# Patient Record
Sex: Female | Born: 1980 | Race: White | Hispanic: No | Marital: Married | Smoking: Former smoker
Health system: Southern US, Community
[De-identification: ages and names within clinical notes are randomized; demographics above are authoritative.]

## PROBLEM LIST (undated history)

## (undated) DIAGNOSIS — M249 Joint derangement, unspecified: Secondary | ICD-10-CM

## (undated) DIAGNOSIS — K589 Irritable bowel syndrome without diarrhea: Secondary | ICD-10-CM

## (undated) DIAGNOSIS — G43909 Migraine, unspecified, not intractable, without status migrainosus: Secondary | ICD-10-CM

## (undated) DIAGNOSIS — I499 Cardiac arrhythmia, unspecified: Secondary | ICD-10-CM

## (undated) DIAGNOSIS — E282 Polycystic ovarian syndrome: Secondary | ICD-10-CM

## (undated) DIAGNOSIS — I10 Essential (primary) hypertension: Secondary | ICD-10-CM

## (undated) DIAGNOSIS — K8020 Calculus of gallbladder without cholecystitis without obstruction: Secondary | ICD-10-CM

## (undated) DIAGNOSIS — H7090 Unspecified mastoiditis, unspecified ear: Secondary | ICD-10-CM

## (undated) DIAGNOSIS — N83209 Unspecified ovarian cyst, unspecified side: Secondary | ICD-10-CM

## (undated) DIAGNOSIS — R519 Headache, unspecified: Secondary | ICD-10-CM

## (undated) DIAGNOSIS — B999 Unspecified infectious disease: Secondary | ICD-10-CM

## (undated) DIAGNOSIS — G5760 Lesion of plantar nerve, unspecified lower limb: Secondary | ICD-10-CM

## (undated) DIAGNOSIS — R630 Anorexia: Secondary | ICD-10-CM

## (undated) DIAGNOSIS — R51 Headache: Secondary | ICD-10-CM

## (undated) DIAGNOSIS — K859 Acute pancreatitis without necrosis or infection, unspecified: Secondary | ICD-10-CM

## (undated) DIAGNOSIS — F419 Anxiety disorder, unspecified: Secondary | ICD-10-CM

## (undated) DIAGNOSIS — R17 Unspecified jaundice: Secondary | ICD-10-CM

## (undated) DIAGNOSIS — M419 Scoliosis, unspecified: Secondary | ICD-10-CM

## (undated) DIAGNOSIS — L409 Psoriasis, unspecified: Secondary | ICD-10-CM

## (undated) HISTORY — DX: Essential (primary) hypertension: I10

## (undated) HISTORY — DX: Polycystic ovarian syndrome: E28.2

---
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
## 1999-08-08 ENCOUNTER — Other Ambulatory Visit: Admission: RE | Admit: 1999-08-08 | Discharge: 1999-08-08 | Payer: Self-pay | Admitting: Obstetrics & Gynecology

## 2000-04-22 ENCOUNTER — Ambulatory Visit (HOSPITAL_COMMUNITY): Admission: RE | Admit: 2000-04-22 | Discharge: 2000-04-22 | Payer: Self-pay | Admitting: Emergency Medicine

## 2000-04-22 ENCOUNTER — Encounter: Payer: Self-pay | Admitting: Emergency Medicine

## 2000-06-01 ENCOUNTER — Ambulatory Visit (HOSPITAL_COMMUNITY): Admission: AD | Admit: 2000-06-01 | Discharge: 2000-06-01 | Payer: Self-pay | Admitting: Obstetrics & Gynecology

## 2000-06-01 ENCOUNTER — Encounter (INDEPENDENT_AMBULATORY_CARE_PROVIDER_SITE_OTHER): Payer: Self-pay

## 2000-12-10 HISTORY — PX: DILATION AND CURETTAGE OF UTERUS: SHX78

## 2001-02-11 ENCOUNTER — Emergency Department (HOSPITAL_COMMUNITY): Admission: EM | Admit: 2001-02-11 | Discharge: 2001-02-11 | Payer: Self-pay | Admitting: Emergency Medicine

## 2001-03-19 ENCOUNTER — Other Ambulatory Visit: Admission: RE | Admit: 2001-03-19 | Discharge: 2001-03-19 | Payer: Self-pay | Admitting: Obstetrics & Gynecology

## 2002-01-22 ENCOUNTER — Ambulatory Visit (HOSPITAL_COMMUNITY): Admission: RE | Admit: 2002-01-22 | Discharge: 2002-01-22 | Payer: Self-pay | Admitting: Gastroenterology

## 2002-01-22 ENCOUNTER — Encounter: Payer: Self-pay | Admitting: Gastroenterology

## 2002-08-26 ENCOUNTER — Emergency Department (HOSPITAL_COMMUNITY): Admission: EM | Admit: 2002-08-26 | Discharge: 2002-08-27 | Payer: Self-pay | Admitting: Emergency Medicine

## 2002-08-27 ENCOUNTER — Encounter: Payer: Self-pay | Admitting: Emergency Medicine

## 2003-10-09 ENCOUNTER — Emergency Department (HOSPITAL_COMMUNITY): Admission: EM | Admit: 2003-10-09 | Discharge: 2003-10-09 | Payer: Self-pay | Admitting: Emergency Medicine

## 2004-02-21 ENCOUNTER — Ambulatory Visit (HOSPITAL_COMMUNITY): Admission: RE | Admit: 2004-02-21 | Discharge: 2004-02-21 | Payer: Self-pay | Admitting: Family Medicine

## 2004-05-23 ENCOUNTER — Other Ambulatory Visit: Admission: RE | Admit: 2004-05-23 | Discharge: 2004-05-23 | Payer: Self-pay | Admitting: Family Medicine

## 2004-07-25 ENCOUNTER — Encounter: Payer: Self-pay | Admitting: Emergency Medicine

## 2004-07-25 ENCOUNTER — Inpatient Hospital Stay (HOSPITAL_COMMUNITY): Admission: AD | Admit: 2004-07-25 | Discharge: 2004-07-25 | Payer: Self-pay | Admitting: Obstetrics and Gynecology

## 2004-07-26 ENCOUNTER — Inpatient Hospital Stay (HOSPITAL_COMMUNITY): Admission: AD | Admit: 2004-07-26 | Discharge: 2004-07-26 | Payer: Self-pay | Admitting: Obstetrics and Gynecology

## 2004-12-10 HISTORY — PX: LAPAROSCOPIC CHOLECYSTECTOMY: SUR755

## 2006-09-05 ENCOUNTER — Encounter: Admission: RE | Admit: 2006-09-05 | Discharge: 2006-09-05 | Payer: Self-pay | Admitting: Orthopaedic Surgery

## 2006-11-11 ENCOUNTER — Other Ambulatory Visit: Admission: RE | Admit: 2006-11-11 | Discharge: 2006-11-11 | Payer: Self-pay | Admitting: Obstetrics and Gynecology

## 2007-10-05 ENCOUNTER — Emergency Department (HOSPITAL_COMMUNITY): Admission: EM | Admit: 2007-10-05 | Discharge: 2007-10-05 | Payer: Self-pay | Admitting: Emergency Medicine

## 2007-10-12 ENCOUNTER — Encounter (INDEPENDENT_AMBULATORY_CARE_PROVIDER_SITE_OTHER): Payer: Self-pay | Admitting: General Surgery

## 2007-10-12 ENCOUNTER — Inpatient Hospital Stay (HOSPITAL_COMMUNITY): Admission: EM | Admit: 2007-10-12 | Discharge: 2007-10-15 | Payer: Self-pay | Admitting: Emergency Medicine

## 2007-12-02 ENCOUNTER — Encounter: Admission: RE | Admit: 2007-12-02 | Discharge: 2007-12-02 | Payer: Self-pay | Admitting: General Surgery

## 2007-12-12 ENCOUNTER — Inpatient Hospital Stay (HOSPITAL_COMMUNITY): Admission: EM | Admit: 2007-12-12 | Discharge: 2007-12-15 | Payer: Self-pay | Admitting: Emergency Medicine

## 2008-03-09 ENCOUNTER — Other Ambulatory Visit: Admission: RE | Admit: 2008-03-09 | Discharge: 2008-03-09 | Payer: Self-pay | Admitting: Obstetrics and Gynecology

## 2008-08-30 ENCOUNTER — Encounter: Admission: RE | Admit: 2008-08-30 | Discharge: 2008-08-30 | Payer: Self-pay | Admitting: Family Medicine

## 2008-09-06 ENCOUNTER — Encounter: Admission: RE | Admit: 2008-09-06 | Discharge: 2008-09-06 | Payer: Self-pay | Admitting: Family Medicine

## 2008-09-09 ENCOUNTER — Encounter: Admission: RE | Admit: 2008-09-09 | Discharge: 2008-09-09 | Payer: Self-pay | Admitting: Family Medicine

## 2009-03-10 ENCOUNTER — Other Ambulatory Visit: Admission: RE | Admit: 2009-03-10 | Discharge: 2009-03-10 | Payer: Self-pay | Admitting: Obstetrics and Gynecology

## 2010-04-24 ENCOUNTER — Other Ambulatory Visit: Admission: RE | Admit: 2010-04-24 | Discharge: 2010-04-24 | Payer: Self-pay | Admitting: Obstetrics and Gynecology

## 2010-12-31 ENCOUNTER — Encounter: Payer: Self-pay | Admitting: Gastroenterology

## 2011-04-24 NOTE — H&P (Signed)
Elaine [name], Elaine [name]                  ACCOUNT NO.:  [id number]   MEDICAL RECORD NO.:  [id number]          PATIENT TYPE:  INP   LOCATION:  1317                         FACILITY:  [facility]   PHYSICIAN:  Kela Millin, M.D.DATE OF BIRTH:  August 31, 1981   DATE OF ADMISSION:  12/12/2007  DATE OF DISCHARGE:                              HISTORY & PHYSICAL   PRIMARY CARE PHYSICIAN:  Dr. Nicholos Johns.   CHIEF COMPLAINT:  Worsening ear pain with nausea and vomiting.   HISTORY OF PRESENT ILLNESS:  The patient is a 30 year old white female  who has been in good health until six days ago when she developed right  ear pain and went to the walk-in clinic, and she states that she was  diagnosed with an ear infection and started on ear drops.  That night  she states that her symptoms worsened, and she also began having nausea  and vomiting as well as fevers.  She went to her PCPs office the next  day, and she was given an IM antibiotic and also a prescription for  Ceftin.  She reports that the left ear seemed to improve, but then she  began having pain in her right ear which has worsened.  In the past two  days, her nausea and vomiting has also worsened, and she has continued  to have fevers.  She also developed diarrhea, having multiple episodes a  day after she was started on the oral antibiotics.  She denies chest  pain, cough, fevers, melena, and no hematochezia.   The patient was seen in the ER, and a CT scan of her head was done which  showed mild bilateral mastoiditis with air fluid level on the right,  also otitis externa on the right with mild thickening of the left  tympanic membrane, no middle ear effusion.  A urinalysis was done which  was unremarkable, and she is admitted for further evaluation and  management.   PAST MEDICAL HISTORY:  History of gallstone pancreatitis, status post  cholecystectomy.   MEDICATIONS:  1. Allegra.  2. Percocet.  3. Ceftin as above.   ALLERGIES:  NKDA.   SOCIAL HISTORY:  Denies tobacco, also denies alcohol and denies illicit  drug use.   FAMILY HISTORY:  Her dad has hypertension.   REVIEW OF SYSTEMS:  As per HPI, other review of systems negative.   PHYSICAL EXAM:  GENERAL:  The patient is a young white female with  multiple tattoos on both upper extremities as well as on her lower legs  bilaterally.  In no apparent distress.  VITAL SIGNS:  Her temperature is 97.5 with a max temperature in the ER  of 101.8.  Her blood pressure is 129/82.  Her pulse is 117, and  respiratory rate is 20.  O2 sat is 96%.  HEENT:  PERRL, EOMI.  The right ear external canal is tender to the  speculum and TM with exudates on the right.  Left no erythema and no  exudates noted.  Also no tenderness in the external canal.  She is  tender over her mastoid areas  bilaterally.  NECK:  Supple.  No thyromegaly.  Nontender.  LUNGS:  Clear to auscultation bilaterally.  No crackles or wheezes.  CARDIOVASCULAR:  Tachycardia, regular, normal S1 and S2.  ABDOMEN:  Soft.  Bowel sounds present.  Nontender, nondistended.  No  organomegaly and no masses palpable.  EXTREMITIES:  No cyanosis and no edema.   LABORATORY DATA:  CT scan as per HPI.  Her urinalysis is unremarkable.  Sodium is 140 with a potassium of 4.1, chloride 105, CO2 of 25, glucose  116, BUN 5, creatinine 0.67.  Her LFTs are within normal limits.  Lipase  is 16.  White cell count is 6.4 with a hemoglobin of 11.9, hematocrit of  34.1, platelet count of 260.  Point of care markers negative x1.   ASSESSMENT AND PLAN:  1. Bilateral past mastoiditis, secondary to otitis media.  No findings      consistent with meningitis on exam.  Will place on empiric IV      antibiotics and follow.  2. Right otitis media and externa, antibiotics as above and pain      management.  Also Cipro HC otic.  3. Diarrhea, likely antibiotic associated.  Will obtain a C diff toxin      and follow.      Kela Millin, M.D.   Electronically Signed     ACV/MEDQ  D:  12/12/2007  T:  12/12/2007  Job:  [id number]   cc:   Molly Maduro A. Nicholos Johns, M.D.  Fax: [phone]

## 2011-04-24 NOTE — Consult Note (Signed)
Elaine [name], Elaine [name]                  ACCOUNT NO.:  [id number]   MEDICAL RECORD NO.:  [id number]          PATIENT TYPE:  INP   LOCATION:  1333                         FACILITY:  [facility]   PHYSICIAN:  Ollen Gross. Vernell Morgans, M.D. DATE OF BIRTH:  10-22-1981   DATE OF CONSULTATION:  10/12/2007  DATE OF DISCHARGE:                                 CONSULTATION   Elaine [name] is a 30 year old white female who presents to the emergency  department with upper abdominal pain for the last week.  She has had a  couple episodes of nausea and vomiting during this period of time, she  denies any fevers.  The pain sort of radiates into her back and has  tended to come and go over the last week.  She was evaluated several  days ago in the ER, at which time an ultrasound was negative, but  ultrasound this evening does show some sludge and stones in her  gallbladder.  She also has some elevated liver functions that could be  an indicator of a common bile duct stone.  She otherwise denies any  chest pain, shortness of breath, diarrhea or dysuria.  The rest for  review of systems are unremarkable.   PAST MEDICAL HISTORY:  None.   PAST SURGICAL HISTORY:  Significant for a D&C.   MEDICATIONS:  Include Allegra and Percocet.   ALLERGIES:  SULFA.   SOCIAL HISTORY:  She denies use of alcohol or tobacco products.   FAMILY HISTORY:  Noncontributory.   PHYSICAL EXAMINATION:  VITAL SIGNS:  Temperature is 97.7, blood pressure  110/73, pulse of 91.  GENERAL:  She is a well-developed, well-nourished white female in no  acute distress.  SKIN:  Warm, dry, no jaundice.  HEENT:  Eyes:  Extraocular muscles are intact.  Pupils are equal, round  and reactive to light.  Sclerae nonicteric.  LUNGS:  Clear bilaterally with no use accessory respiratory muscles.  HEART:  Regular rate and rhythm with an impulse in left chest.  ABDOMEN:  Soft with some mild right upper quadrant epigastric tenderness  but no guarding or peritonitis.   No palpable mass or hepatosplenomegaly.  EXTREMITIES:  No cyanosis or edema.  Good strength in arms and legs.  PSYCHOLOGIC:  She is alert and oriented x3 with no evidence today of  anxiety or depression.   LABORATORY DATA:  On review of her lab work, it was significant for  elevated liver functions, normal white count.   On review of her ultrasound, she did have some sludge and a couple of  stones in her gallbladder but no gallbladder [name] thickening.   ASSESSMENT/PLAN:  This is a 30 year old white female with what sounds  like symptomatic gallstones.  She may also have a common duct stone.  Because she is symptomatic, I think she would probably benefit from a  cholecystectomy at some point.  She may need ERCP prior to surgery if  her liver functions do not normalize.  She also has a little bit of  elevation of her pancreatic enzymes which could be some biliary  pancreatitis.  We will wait for her pancreatitis to also resolve prior  to planning for cholecystectomy.  I have discussed with her in detail  the risks and benefits of the operation to remove the gallbladder as  well as some of the technical aspects and she understands and wishes to  proceed.      Ollen Gross. Vernell Morgans, M.D.  Electronically Signed     PST/MEDQ  D:  10/12/2007  T:  10/13/2007  Job:  [id number]

## 2011-04-24 NOTE — H&P (Signed)
Elaine [name], Elaine [name]                  ACCOUNT NO.:  [id number]   MEDICAL RECORD NO.:  [id number]          PATIENT TYPE:  INP   LOCATION:  1333                         FACILITY:  [facility]   PHYSICIAN:  Graylin Shiver, M.D.   DATE OF BIRTH:  05/22/1981   DATE OF ADMISSION:  10/12/2007  DATE OF DISCHARGE:                              HISTORY & PHYSICAL   CHIEF COMPLAINT:  Abdominal pain.   HISTORY OF PRESENT ILLNESS:  The patient is a 30 year old white female  who presented to the emergency room today with complaints of epigastric  and right upper quadrant abdominal pain.  She has been having  intermittent pain for about three months.  She was seen by her primary  care physician and also by me in September.  I saw her on August 12, 2007, in the office with complaints of chronic epigastric pain which  seemed to improve with Prilosec.  At that time, it was felt that the  pain was acid peptic in nature and it was considered that she could  possibly stop her Prilosec.  The pain came back and she kept having  intermittent episodes of abdominal pain.  On October 05, 2007, she came  to the emergency room with abdominal pain again and had evaluation which  included a normal abdominal ultrasound, lipase, LFTs, CBC, pregnancy  test and urinalysis.  I saw her in the office again a few days ago with  ongoing complaints of epigastric pain.  We discussed the possibility  that it could still be her gallbladder even though she did not have  stones on her ultrasound and I ordered a HIDA scan with ejection  fraction, which has not been done yet.  Today she presents again to the  emergency room with abdominal pain and today her ultrasound shows  gallbladder sludge and possibly two stones.  Her labs reveal a bilirubin  of 6, AST 305, ALT 378 and lipase 83.   ALLERGIES:  None known.   MEDICATIONS:  Prilosec, Flonase, Allegra.   MEDICAL PROBLEMS:  1. History of depression/anxiety.  2. Migraine.   PAST  SURGICAL HISTORY:  D&C in the past.   SOCIAL HISTORY:  Does not smoke or drink alcohol.   REVIEW OF SYSTEMS:  Has not been experiencing fever, thinks she has been  jaundiced the last few days.  No complaints of chest pain, shortness of  breath, cough or sputum production.   FAMILY HISTORY:  Maternal grandmother had colon cancer.   PHYSICAL EXAMINATION:  VITAL SIGNS:  Stable in the ER.  GENERAL APPEARANCE:  She is in no distress.  HEENT:  She does have scleral icterus on exam today.  NECK:  Supple.  No masses, adenopathy or goiter.  HEART:  Regular rhythm.  No murmurs, gallops or rubs.  LUNGS:  Clear.  ABDOMEN:  Bowel sounds normal, soft, nontender.  No hepatosplenomegaly.  The exam of the abdomen was done after the patient had been given  Dilaudid and she actually feels fine at the present time.  EXTREMITIES:  She has tattoos on both arms.  IMPRESSION:  1. Cholecystitis with possible gallstones.  2. Possible mild pancreatitis.   PLAN:  The patient is being admitted to the hospital.  We will make her  n.p.o., start IV fluids, start Mefoxin 1 gram IV q.6h.  A surgical  consult will be obtained.  We will recheck CBC and a CMP, amylase and  lipase in the morning.  It is unclear tonight whether she needs an ERCP  prior to cholecystectomy or go directly to cholecystectomy with  intraoperative cholangiograms.  I will also obtain a hepatitis profile.           ______________________________  Graylin Shiver, M.D.     SFG/MEDQ  D:  10/12/2007  T:  10/13/2007  Job:  [id number]   cc:   Tally Joe, M.D.  Fax: [phone]   P.J. Carolynne Edouard, M.D.

## 2011-04-24 NOTE — Op Note (Signed)
Elaine [name], Elaine [name]                  ACCOUNT NO.:  [id number]   MEDICAL RECORD NO.:  [id number]          PATIENT TYPE:  INP   LOCATION:  1333                         FACILITY:  [facility]   PHYSICIAN:  Ollen Gross. Vernell Morgans, M.D. DATE OF BIRTH:  Nov 08, 1981   DATE OF PROCEDURE:  10/14/2007  DATE OF DISCHARGE:                               OPERATIVE REPORT   PREOPERATIVE DIAGNOSIS:  Gallstone pancreatitis.   POSTOPERATIVE DIAGNOSIS:  Gallstone pancreatitis.   PROCEDURES:  Laparoscopic cholecystectomy, intraoperative cholangiogram.   SURGEON:  Ollen Gross. Vernell Morgans, M.D.   ASSISTANT:  Sharlet Salina T. Hoxworth, M.D.   ANESTHESIA:  General endotracheal.   PROCEDURE:  After informed consent was obtained, the patient was brought  to the operating room and placed in supine position on the operating  room table.  After induction of general anesthesia, the patient's  abdomen was prepped with Betadine and draped in usual sterile manner.  The area below the umbilicus was infiltrated with 0.25% Marcaine.  A  small incision was made with 15 blade knife.  This incision was carried  down through the subcutaneous tissue bluntly with a hemostat and [employer] retractors until the linea alba was identified.  The linea alba was  incised with a 15 blade knife and each side was grasped with Kocher  clamps and elevated anteriorly.  The preperitoneal space was probed,  bluntly with a hemostat until the peritoneum was opened and access was  gained to the abdominal cavity.  A 0 Vicryl pursestring stitch was  placed in the fascia around the opening.  Hasson cannula was placed  through the opening, anchored in place with previous placed Vicryl  pursestring stitch.  The abdomen was then insufflated carbon dioxide  without difficulty.  The patient was placed in reverse Trendelenburg  position, rotated slightly with the right side up, laparoscope was  inserted through the Hasson cannula and the right upper quadrant was  inspected.  The dome of gallbladder and liver readily identified.  Next  the epigastric region was infiltrated 0.25% Marcaine.  A small incision  was made with 15 blade knife.  A 10 mm port was then placed bluntly  through this incision into the abdominal cavity under direct vision.  Sites chosen on the right side of abdomen for placement of 5-mm ports.  Each of these areas infiltrated 0.25% Marcaine.  Small stab incisions  were made with 15 blade knife.  A5-mm ports were placed bluntly through  these incisions into the abdominal cavity under direct vision.  A blunt  grasper was placed through the lateral-most 5-mm port used to grasp dome  of gallbladder and elevate it anteriorly and superiorly.  Another blunt  grasper, was placed through the other 5 mm port and used to retract on  the body and neck of the gallbladder.  Dissector was placed through the  epigastric port and using electrocautery.  The peritoneal reflection at  the gallbladder neck was opened.  Blunt dissection was then carried out  in this area until the gallbladder neck cystic duct junction was readily  identified and  a good window was created.  A single clip was placed on  the gallbladder neck, small ductotomy was made just below the clip with  a laparoscopic scissors.  A 14 gauge angiocath was then placed  percutaneously through the anterior abdominal wall under direct vision.  A Reddick cholangiogram catheter was placed through the angiocath and  flushed.  The Reddick catheter was then placed within the cystic duct  and anchored in place with the clip, a cholangiogram was obtained that  showed no filling defects, good emptying in the duodenum and adequate  length on the cystic duct.  The anchoring clip and catheters were  removed from the patient.  Two clips placed proximally on the cystic  duct and duct was divided between the two sets of clips.  Posterior to  this, the cystic artery was identified with a branch.  Each  of these  branches was dissected circumferentially and bluntly with the dissector  until a good window was created.  Each branch was controlled with two  proximal clips, distal clip and divided between two sets of clips.  Next, a laparoscopic hook cautery device was used to separate the  gallbladder from liver bed.  Prior to completely detaching the  gallbladder from the liver bed, the liver bed was inspected and several  small bleeding points were coagulated with electrocautery until the area  was completely hemostatic.  The gallbladder was then detached the rest  of the way from liver bed without difficulty.  The abdomen was then  irrigated with copious amounts of saline until the effluent was clear.  The laparoscope was then moved to the epigastric port.  The gallbladder  grasper was placed through the [facility] cannula and used to grasp the neck  of the gallbladder.  The gallbladder with the Hasson cannula was removed  through the infraumbilical port without difficulty.  The fascial defect  was closed with previously placed Vicryl pursestring stitch as well as  with another interrupted figure-of-eight 0 Vicryl stitch.  The rest of  ports removed under direct vision and were found to be hemostatic.  Gas  was allowed to escape and the skin incisions were all closed with  interrupted 4-0 Monocryl subcuticular stitches.  Benzoin, Steri-Strips  and sterile dressings were applied.  The patient tolerated procedure  well.  At end of the case all needle, sponge, instrument counts correct.  The patient was awakened and taken recovery room in stable condition.      Ollen Gross. Vernell Morgans, M.D.  Electronically Signed     PST/MEDQ  D:  10/14/2007  T:  10/15/2007  Job:  [id number]

## 2011-04-24 NOTE — Discharge Summary (Signed)
NAMEEUDELL, JULIAN                  ACCOUNT NO.:  [id number]   MEDICAL RECORD NO.:  [id number]          PATIENT TYPE:  INP   LOCATION:  1317                         FACILITY:  [facility]   PHYSICIAN:  Kela Millin, M.D.DATE OF BIRTH:  1981-07-14   DATE OF ADMISSION:  12/12/2007  DATE OF DISCHARGE:  12/15/2007                               DISCHARGE SUMMARY   DISCHARGE DIAGNOSES:  1. Right otitis media and externa.  2. Bilateral mastoiditis, mild.  3. Diarrhea, likely antibiotic associated, Clostridium difficile      studies negative.  4. Hypokalemia, resolved.  5. History of gallstone pancreatitis, status post cholecystectomy in      the past.   PROCEDURES/STUDIES:  CT scan of the orbit and temples:  Mastoiditis on  the right with mucosal thickening and small air/fluid level.  Skin  thickening in the external canal on the right.  No CT evidence of otitis  media on the right.  Mild thickening of the left tympanic membrane  without evidence of middle ear effusion or cholesteatoma.  Mastoiditis  on the left.   BRIEF HISTORY:  The patient is a 30 year old white female with the above  listed medical problems who presented with complaints of worsening ear  pain as well as nausea and vomiting.  She reported that she had been  diagnosed with an ear infection at the walk-in clinic and started on ear  drops.  Her symptoms worsened and she followed up with her primary care  physician subsequently and at that point she was given a dose of IM  antibiotics and then a prescription for Ceftin.  She reported that  initially her symptoms appeared to improve but then the patient in her  ears began to worsen and she also developed nausea and vomiting and was  unable to keep anything down.  She also reported she had been having  fevers for several days and developed diarrhea for a couple of days  prior to presentation.  In the ER, the patient was found to be febrile and also a CT scan was  done and  the results are as stated above.  Please see the dictated H&P of December 12, 2007 for details of the  admission, physical exam, as well as the laboratory data.   HOSPITAL COURSE:  1. Bilateral mastoiditis and right otitis media/externa.  Upon      admission the patient had blood cultures done and was empirically      started on IV antibiotics.  She was also placed on ear drops for      the otitis externa.  The patient has remained afebrile and      hemodynamically stable and her symptoms have improved.  She is able      to hear well but states that she still feels like her hearing is      muffled.  I discussed the patient with ENT, Dr. Annalee Genta, and she      is to follow up with him upon discharge for further evaluation and      management.  She is being discharged  on oral antibiotics as well as      the Cipro ear drops.  Her blood cultures are negative.  2. Diarrhea, likely antibiotic associated as above.  Stool studies      were done including a C. diff  x2 and these are negative so far.      She was placed on Lactinex as well as Questran but the diarrhea did      not improve and so Imodium was added and her diarrhea resolved with      that.  3. Hypokalemia.  Potassium was replaced in the hospital.  Her last      potassium today prior to discharge was 4.   DISCHARGE MEDICATIONS:  1. Augmentin 875 mg p.o. b.i.d. x10 more days.  2. Cipro HC Otic 3 drops to ears b.i.d. for 4 more days.  3. Lactinex 1 packet t.i.d. while on antibiotics.   DISCHARGE CONDITION:  Improved.      Kela Millin, M.D.  Electronically Signed     ACV/MEDQ  D:  12/15/2007  T:  12/15/2007  Job:  [id number]   cc:   Dr Azucena Kuba with [facility]

## 2011-04-25 ENCOUNTER — Other Ambulatory Visit: Payer: Self-pay | Admitting: Obstetrics and Gynecology

## 2011-04-25 ENCOUNTER — Other Ambulatory Visit (HOSPITAL_COMMUNITY)
Admission: RE | Admit: 2011-04-25 | Discharge: 2011-04-25 | Disposition: A | Discharge status: Home / Self Care | Payer: Managed Care, Other (non HMO) | Source: Ambulatory Visit | Attending: Obstetrics and Gynecology | Admitting: Obstetrics and Gynecology

## 2011-04-25 DIAGNOSIS — Z01419 Encounter for gynecological examination (general) (routine) without abnormal findings: Secondary | ICD-10-CM | POA: Insufficient documentation

## 2011-04-27 NOTE — Discharge Summary (Signed)
Elaine [name], Elaine [name]                  ACCOUNT NO.:  [id number]   MEDICAL RECORD NO.:  [id number]          PATIENT TYPE:  INP   LOCATION:  1333                         FACILITY:  [facility]   PHYSICIAN:  Shirley Friar, MDDATE OF BIRTH:  [birth year]   DATE OF ADMISSION:  10/12/2007  DATE OF DISCHARGE:  10/15/2007                               DISCHARGE SUMMARY   DISCHARGE DIAGNOSES:  1. Gallstone pancreatitis.  2. Chronic cholecystitis.  3. Cholelithiasis.   CONSULTS:  General surgery, Dr. Chevis Pretty.   PROCEDURES:  1. Laparoscopic cholecystectomy.  2. Intraoperative cholangiogram.   HOSPITAL COURSE:  Ms. Daleen Squibb was admitted on October 12, 2007 secondary to  gallstone pancreatitis with elevated liver function tests and positive  gallstones on ultrasound.  Her LFTs trended down on admission with bowel  rest, and she was set up for a laparoscopic cholecystectomy with  intraoperative cholangiogram on October 14, 2007.  The procedure was  successful with negative intraoperative cholangiogram, and pathology  from the gallbladder showed chronic cholecystitis and cholelithiasis.  She did well post procedure and tolerated a diet and was stable for  discharge on October 15, 2007.   DISCHARGE CONDITION:  Improved.   DISCHARGE DIET:  Regular diet.   DISCHARGE ACTIVITY:  No lifting for weeks.   DISCHARGE FOLLOWUP:  1. Follow up with Dr. Carolynne Edouard in 2 weeks.  2. Follow up with Dr. Evette Cristal in 2 weeks.   DISCHARGE MEDICATIONS:  1. Percocet as needed.  2. Allegra as previously directed.      Shirley Friar, MD  Electronically Signed     VCS/MEDQ  D:  11/04/2007  T:  11/04/2007  Job:  [id number]   cc:   Ollen Gross. Vernell Morgans, M.D.  [address]  [place]  [place] [address]   Graylin Shiver, M.D.  Fax: [phone]

## 2011-04-27 NOTE — Op Note (Signed)
[facility]  Patient:    Elaine [name], Elaine [name]                         MRN: [id number] Proc. Date: 06/01/00 Adm. Date:  [id number] Attending:  Minette Headland                           Operative Report  PREOPERATIVE DIAGNOSIS:       Missed abortion.  POSTOPERATIVE DIAGNOSIS:      Missed abortion.  OPERATION:                    Dilatation and evacuation.  SURGEON:                      Freddy Finner, M.D.  ANESTHESIA:                   Managed intravenous sedation.  INTRAOPERATIVE COMPLICATIONS:  None.  INDICATIONS:                  Patient is a 30 year old who presented for her initial OB evaluation on the day prior to surgery.  On examination, she was found to be 8- to 9-weeks-gestational-size by bimanual exam, but was 11-1/2 weeks by dates.  Ultrasound was obtained which showed an 8-week 3-day-size intrauterine sac but no fetal parts or pole.  She had noted the onset of vaginal spotting on the evening prior to her visit.  Given the presence of a nonviable pregnancy, she is now admitted for D&E.  DESCRIPTION OF PROCEDURE:     She was admitted on the morning of surgery, brought to the operating room and there, given adequate intravenous sedation and placed in the dorsal lithotomy position using the Allen stirrups system. Betadine prep was carried out, bivalve speculum was introduced, cervix was grasped with a single-tooth tenaculum and progressively dilated to #27 with Pratts.  An 8-mm suction cannula was introduced and aspiration produced obvious parts of conception.  This was continued until it was felt the cavity was evacuated.  Exploration with Randall stone forceps, gentle sharp curettage and repeat vacuum aspiration confirmed complete evacuation of the cavity.  The patient was awakened and taken to the recovery room in good condition.DD: 06/01/00 TD:  06/02/00 Job: [id number]

## 2011-08-30 LAB — URINALYSIS, ROUTINE W REFLEX MICROSCOPIC
Glucose, UA: NEGATIVE
Ketones, ur: 15 — AB
Specific Gravity, Urine: 1.015
Urobilinogen, UA: 0.2
pH: 8.5 — ABNORMAL HIGH

## 2011-08-30 LAB — CLOSTRIDIUM DIFFICILE EIA: C difficile Toxins A+B, EIA: NEGATIVE

## 2011-08-30 LAB — CULTURE, BLOOD (ROUTINE X 2)
Culture: NO GROWTH
Culture: NO GROWTH

## 2011-08-30 LAB — POCT CARDIAC MARKERS: Operator id: 4661

## 2011-08-30 LAB — STOOL CULTURE

## 2011-08-30 LAB — BASIC METABOLIC PANEL
BUN: <1 — ABNORMAL LOW
Calcium: 7.7 — ABNORMAL LOW
Calcium: 8.6
Chloride: 107
Chloride: 108
Chloride: 110
Creatinine, Ser: 0.52
GFR calc non Af Amer: >60
GFR calc non Af Amer: >60
GFR calc non Af Amer: >60
Glucose, Bld: 92
Glucose, Bld: 93
Potassium: 4

## 2011-08-30 LAB — COMPREHENSIVE METABOLIC PANEL
Albumin: 3.2 — ABNORMAL LOW
Alkaline Phosphatase: 66
CO2: 25
Chloride: 105
Creatinine, Ser: 0.67
GFR calc Af Amer: >60
Potassium: 4.1
Sodium: 140
Total Bilirubin: 1

## 2011-08-30 LAB — CBC
HCT: 34.1 — ABNORMAL LOW
Hemoglobin: 11.9 — ABNORMAL LOW
Hemoglobin: 12
MCHC: 34.8
MCV: 85.4
MCV: 86
Platelets: 234
RDW: 13.9
WBC: 6.4

## 2011-08-30 LAB — DIFFERENTIAL
Basophils Absolute: 0
Basophils Relative: 0
Eosinophils Absolute: 0.1
Monocytes Absolute: 0.2
Monocytes Relative: 4
Neutrophils Relative %: 88 — ABNORMAL HIGH

## 2011-08-30 LAB — LIPASE, BLOOD: Lipase: 16

## 2011-09-18 LAB — CBC
HCT: 34.3 — ABNORMAL LOW
HCT: 35.3 — ABNORMAL LOW
Hemoglobin: 13.9
MCHC: 34.6
MCHC: 34.8
MCV: 87.2
Platelets: 195
Platelets: 197
RBC: 3.94
RBC: 4.72
RDW: 14.6 — ABNORMAL HIGH
RDW: 14.6 — ABNORMAL HIGH
WBC: 5.1
WBC: 6.3
WBC: 6.8

## 2011-09-18 LAB — COMPREHENSIVE METABOLIC PANEL
ALT: 253 — ABNORMAL HIGH
ALT: 378 — ABNORMAL HIGH
AST: 225 — ABNORMAL HIGH
Albumin: 3 — ABNORMAL LOW
Albumin: 3 — ABNORMAL LOW
Albumin: 3.3 — ABNORMAL LOW
Alkaline Phosphatase: 116
Alkaline Phosphatase: 90
Alkaline Phosphatase: 92
Alkaline Phosphatase: 99
BUN: 2 — ABNORMAL LOW
BUN: <1 — ABNORMAL LOW
CO2: 26
Calcium: 8.2 — ABNORMAL LOW
Calcium: 8.7
Chloride: 110
GFR calc Af Amer: >60
GFR calc non Af Amer: >60
Glucose, Bld: 93
Glucose, Bld: 98
Potassium: 3.2 — ABNORMAL LOW
Potassium: 3.3 — ABNORMAL LOW
Potassium: 3.6
Potassium: 3.8
Sodium: 139
Sodium: 139
Sodium: 141
Total Bilirubin: 2.2 — ABNORMAL HIGH
Total Protein: 6
Total Protein: 6.1
Total Protein: 6.5

## 2011-09-18 LAB — HEPATITIS PANEL, ACUTE
HCV Ab: NEGATIVE
Hep A IgM: NEGATIVE
Hep B C IgM: NEGATIVE
Hepatitis B Surface Ag: NEGATIVE

## 2011-09-18 LAB — LIPID PANEL
Cholesterol: 163
HDL: 28 — ABNORMAL LOW
Total CHOL/HDL Ratio: 5.8
VLDL: 21

## 2011-09-18 LAB — URINALYSIS, ROUTINE W REFLEX MICROSCOPIC
Glucose, UA: NEGATIVE
Ketones, ur: >80 — AB
pH: 6

## 2011-09-18 LAB — AMYLASE: Amylase: 116

## 2011-09-18 LAB — LIPASE, BLOOD
Lipase: 64 — ABNORMAL HIGH
Lipase: 83 — ABNORMAL HIGH

## 2011-09-18 LAB — DIFFERENTIAL
Basophils Relative: 1
Eosinophils Absolute: 0.1
Eosinophils Relative: 2
Monocytes Relative: 7
Neutrophils Relative %: 69

## 2011-09-18 LAB — URINE MICROSCOPIC-ADD ON

## 2011-09-19 LAB — URINALYSIS, ROUTINE W REFLEX MICROSCOPIC
Bilirubin Urine: NEGATIVE
Hgb urine dipstick: NEGATIVE
Ketones, ur: NEGATIVE
Specific Gravity, Urine: 1.002 — ABNORMAL LOW
pH: 6

## 2011-09-19 LAB — DIFFERENTIAL
Basophils Relative: 0
Lymphs Abs: 3.6 — ABNORMAL HIGH
Monocytes Relative: 7
Neutro Abs: 4
Neutrophils Relative %: 47

## 2011-09-19 LAB — CBC
HCT: 40
Hemoglobin: 13.8

## 2011-09-19 LAB — BASIC METABOLIC PANEL
BUN: 5 — ABNORMAL LOW
CO2: 24
Chloride: 109
GFR calc non Af Amer: >60
Glucose, Bld: 112 — ABNORMAL HIGH
Potassium: 3.1 — ABNORMAL LOW
Sodium: 143

## 2011-09-19 LAB — HEPATIC FUNCTION PANEL
AST: 25
Albumin: 3.5
Alkaline Phosphatase: 70
Total Bilirubin: 0.5
Total Protein: 7.2

## 2011-09-19 LAB — PREGNANCY, URINE: Preg Test, Ur: NEGATIVE

## 2012-09-29 ENCOUNTER — Other Ambulatory Visit: Payer: Self-pay | Admitting: Obstetrics and Gynecology

## 2012-09-29 ENCOUNTER — Other Ambulatory Visit (HOSPITAL_COMMUNITY)
Admission: RE | Admit: 2012-09-29 | Discharge: 2012-09-29 | Disposition: A | Discharge status: Home / Self Care | Payer: Managed Care, Other (non HMO) | Source: Ambulatory Visit | Attending: Obstetrics and Gynecology | Admitting: Obstetrics and Gynecology

## 2012-09-29 DIAGNOSIS — Z01419 Encounter for gynecological examination (general) (routine) without abnormal findings: Secondary | ICD-10-CM | POA: Insufficient documentation

## 2014-06-29 ENCOUNTER — Other Ambulatory Visit: Payer: Self-pay | Admitting: Neurology

## 2014-06-29 DIAGNOSIS — R202 Paresthesia of skin: Secondary | ICD-10-CM

## 2014-07-05 ENCOUNTER — Ambulatory Visit
Admission: RE | Admit: 2014-07-05 | Discharge: 2014-07-05 | Disposition: A | Discharge status: Home / Self Care | Payer: 59 | Source: Ambulatory Visit | Attending: Neurology | Admitting: Neurology

## 2014-07-05 DIAGNOSIS — R202 Paresthesia of skin: Secondary | ICD-10-CM

## 2014-11-14 ENCOUNTER — Encounter (HOSPITAL_COMMUNITY): Payer: Self-pay | Admitting: Emergency Medicine

## 2014-11-14 ENCOUNTER — Emergency Department (HOSPITAL_COMMUNITY)
Admission: EM | Admit: 2014-11-14 | Discharge: 2014-11-14 | Disposition: A | Discharge status: Home / Self Care | Payer: 59 | Attending: Emergency Medicine | Admitting: Emergency Medicine

## 2014-11-14 DIAGNOSIS — Z3202 Encounter for pregnancy test, result negative: Secondary | ICD-10-CM | POA: Insufficient documentation

## 2014-11-14 DIAGNOSIS — J111 Influenza due to unidentified influenza virus with other respiratory manifestations: Secondary | ICD-10-CM | POA: Diagnosis not present

## 2014-11-14 DIAGNOSIS — R410 Disorientation, unspecified: Secondary | ICD-10-CM | POA: Diagnosis not present

## 2014-11-14 DIAGNOSIS — Z79899 Other long term (current) drug therapy: Secondary | ICD-10-CM | POA: Insufficient documentation

## 2014-11-14 DIAGNOSIS — Z8669 Personal history of other diseases of the nervous system and sense organs: Secondary | ICD-10-CM | POA: Insufficient documentation

## 2014-11-14 DIAGNOSIS — Z8719 Personal history of other diseases of the digestive system: Secondary | ICD-10-CM | POA: Insufficient documentation

## 2014-11-14 DIAGNOSIS — Z720 Tobacco use: Secondary | ICD-10-CM | POA: Insufficient documentation

## 2014-11-14 DIAGNOSIS — R509 Fever, unspecified: Secondary | ICD-10-CM | POA: Diagnosis present

## 2014-11-14 DIAGNOSIS — R000 Tachycardia, unspecified: Secondary | ICD-10-CM | POA: Insufficient documentation

## 2014-11-14 HISTORY — DX: Unspecified mastoiditis, unspecified ear: H70.90

## 2014-11-14 HISTORY — DX: Calculus of gallbladder without cholecystitis without obstruction: K80.20

## 2014-11-14 LAB — CBC WITH DIFFERENTIAL/PLATELET
Basophils Absolute: 0 10*3/uL (ref 0.0–0.1)
Basophils Relative: 1 % (ref 0–1)
Eosinophils Absolute: 0.3 10*3/uL (ref 0.0–0.7)
Eosinophils Relative: 4 % (ref 0–5)
HCT: 41.7 % (ref 36.0–46.0)
Hemoglobin: 14.1 g/dL (ref 12.0–15.0)
Lymphocytes Relative: 10 % — ABNORMAL LOW (ref 12–46)
Lymphs Abs: 0.7 10*3/uL (ref 0.7–4.0)
MCH: 30.1 pg (ref 26.0–34.0)
MCHC: 33.8 g/dL (ref 30.0–36.0)
MCV: 89.1 fL (ref 78.0–100.0)
Monocytes Absolute: 0.5 10*3/uL (ref 0.1–1.0)
Monocytes Relative: 7 % (ref 3–12)
Neutro Abs: 5.5 10*3/uL (ref 1.7–7.7)
Neutrophils Relative %: 80 % — ABNORMAL HIGH (ref 43–77)
Platelets: 218 10*3/uL (ref 150–400)
RBC: 4.68 MIL/uL (ref 3.87–5.11)
RDW: 14.3 % (ref 11.5–15.5)
WBC: 6.9 10*3/uL (ref 4.0–10.5)

## 2014-11-14 LAB — URINALYSIS, ROUTINE W REFLEX MICROSCOPIC
BILIRUBIN URINE: NEGATIVE
Glucose, UA: NEGATIVE mg/dL
HGB URINE DIPSTICK: NEGATIVE
KETONES UR: NEGATIVE mg/dL
Leukocytes, UA: NEGATIVE
NITRITE: NEGATIVE
PROTEIN: NEGATIVE mg/dL
Specific Gravity, Urine: 1.005 (ref 1.005–1.030)
UROBILINOGEN UA: 0.2 mg/dL (ref 0.0–1.0)
pH: 8 (ref 5.0–8.0)

## 2014-11-14 LAB — I-STAT CHEM 8, ED
BUN: <3 mg/dL — ABNORMAL LOW (ref 6–23)
CALCIUM ION: 1.17 mmol/L (ref 1.12–1.23)
Chloride: 102 mEq/L (ref 96–112)
Creatinine, Ser: 0.7 mg/dL (ref 0.50–1.10)
GLUCOSE: 115 mg/dL — AB (ref 70–99)
HEMATOCRIT: 45 % (ref 36.0–46.0)
HEMOGLOBIN: 15.3 g/dL — AB (ref 12.0–15.0)
Potassium: 4 mEq/L (ref 3.7–5.3)
Sodium: 139 mEq/L (ref 137–147)
TCO2: 27 mmol/L (ref 0–100)

## 2014-11-14 LAB — PREGNANCY, URINE: Preg Test, Ur: NEGATIVE

## 2014-11-14 MED ORDER — KETOROLAC TROMETHAMINE 30 MG/ML IJ SOLN
30.0000 mg | Freq: Once | INTRAMUSCULAR | Status: AC
  Administered 2014-11-14: 30 mg via INTRAVENOUS
  Filled 2014-11-14: qty 1

## 2014-11-14 MED ORDER — IBUPROFEN 800 MG PO TABS
800.0000 mg | ORAL_TABLET | Freq: Once | ORAL | Status: DC
  Filled 2014-11-14: qty 1

## 2014-11-14 MED ORDER — OSELTAMIVIR PHOSPHATE 75 MG PO CAPS
75.0000 mg | ORAL_CAPSULE | Freq: Once | ORAL | Status: AC
  Administered 2014-11-14: 75 mg via ORAL
  Filled 2014-11-14: qty 1

## 2014-11-14 MED ORDER — OSELTAMIVIR PHOSPHATE 75 MG PO CAPS: 75.0000 mg | ORAL_CAPSULE | Freq: Two times a day (BID) | ORAL | Status: DC

## 2014-11-14 MED ORDER — SODIUM CHLORIDE 0.9 % IV BOLUS (SEPSIS)
1000.0000 mL | Freq: Once | INTRAVENOUS | Status: AC
  Administered 2014-11-14: 1000 mL via INTRAVENOUS

## 2014-11-14 NOTE — ED Provider Notes (Signed)
CSN: [id number]     Arrival date & time 11/14/14  0000 History   First MD Initiated Contact with Patient 11/14/14 0029     Chief Complaint  Patient presents with  . Fever     (Consider location/radiation/quality/duration/timing/severity/associated sxs/prior Treatment) HPI Comments: States that she's has fever, body aches starting yesterday.  On arrival to the emergency department.  She's developed headache.  She reports that she had some dysuria and UTI symptoms around Thanksgiving that dissipated without any treatment. He denies any new tattoos or piercings.  She states she's been out of work for the last couple months, being laid off, has no known sick contacts.  She does not get flu shots, that she's had a bad reaction to the serum in the past  Patient is a 33 y.o. female presenting with fever. The history is provided by the patient.  Fever Temp source:  Subjective Severity:  Moderate Onset quality:  Sudden Duration:  1 day Timing:  Constant Progression:  Worsening Chronicity:  New Relieved by:  Nothing Worsened by:  Nothing tried Ineffective treatments:  None tried Associated symptoms: chest pain, chills, confusion, headaches and myalgias   Associated symptoms: no cough, no diarrhea, no dysuria, no nausea, no rash, no rhinorrhea, no somnolence, no sore throat and no vomiting   Chest pain:    Quality:  Aching   Severity:  Moderate   Onset quality:  Sudden   Timing:  Constant   Progression:  Unchanged Headaches:    Severity:  Mild   Onset quality:  Sudden   Duration:  6 hours   Timing:  Constant   Progression:  Worsening   Chronicity:  New Myalgias:    Location:  Generalized   Quality:  Aching   Severity:  Moderate   Onset quality:  Sudden   Timing:  Constant   Progression:  Worsening   Past Medical History  Diagnosis Date  . Gall stones   . Mastoiditis    History reviewed. No pertinent past surgical history. History reviewed. No pertinent family  history. History  Substance Use Topics  . Smoking status: Current Some Day Smoker  . Smokeless tobacco: Never Used  . Alcohol Use: Yes   OB History    No data available     Review of Systems  Constitutional: Positive for fever and chills.  HENT: Negative for rhinorrhea and sore throat.   Respiratory: Positive for shortness of breath. Negative for cough.   Cardiovascular: Positive for chest pain. Negative for leg swelling.  Gastrointestinal: Positive for abdominal pain. Negative for nausea, vomiting and diarrhea.  Genitourinary: Positive for flank pain. Negative for dysuria and frequency.  Musculoskeletal: Positive for myalgias, back pain and arthralgias.  Skin: Negative for rash.  Neurological: Positive for headaches.  Psychiatric/Behavioral: Positive for confusion.  All other systems reviewed and are negative.     Allergies  Sulfa antibiotics and Morphine and related  Home Medications   Prior to Admission medications   Medication Sig Start Date End Date Taking? Authorizing Provider  B Complex Vitamins SOLN Take 5 mLs by mouth daily.   Yes Historical Provider, MD  cetirizine (ZYRTEC) 10 MG tablet Take 10 mg by mouth daily.   Yes Historical Provider, MD  CHROMIUM PO Take 1 tablet by mouth daily.   Yes Historical Provider, MD  clonazePAM (KLONOPIN) 1 MG tablet Take 1 mg by mouth 2 (two) times daily as needed for anxiety.   Yes Historical Provider, MD  HOODIA GORDONII PO Take 2 tablets  by mouth 3 (three) times daily.   Yes Historical Provider, MD  ibuprofen (ADVIL,MOTRIN) 200 MG tablet Take 200 mg by mouth every 6 (six) hours as needed for moderate pain.   Yes Historical Provider, MD  JOLIVETTE 0.35 MG tablet Take 1 tablet by mouth daily.  11/01/14  Yes Historical Provider, MD  Multiple Minerals-Vitamins (CALCIUM-MAGNESIUM-ZINC-D3 PO) Take 1 tablet by mouth daily.   Yes Historical Provider, MD  oseltamivir (TAMIFLU) 75 MG capsule Take 1 capsule (75 mg total) by mouth 2 (two)  times daily. 11/14/14   Arman FilterGail K Lucille Witts, NP   BP 101/55 mmHg  Pulse 105  Temp(Src) 101.5 F (38.6 C) (Rectal)  Resp 19  Ht 5\' 8"  (1.727 m)  Wt 138 lb (62.596 kg)  BMI 20.99 kg/m2  SpO2 98%  LMP 10/26/2014 Physical Exam  Constitutional: She is oriented to person, place, and time. She appears well-developed and well-nourished.  HENT:  Head: Normocephalic.  Eyes: Pupils are equal, round, and reactive to light.  Neck: Normal range of motion.  Cardiovascular: Regular rhythm.  Tachycardia present.   Pulmonary/Chest: Effort normal. She exhibits no tenderness.  Abdominal: Soft. Bowel sounds are normal.  Musculoskeletal: Normal range of motion. She exhibits no edema or tenderness.  Neurological: She is alert and oriented to person, place, and time.  Skin: Skin is warm.  Nursing note and vitals reviewed.   ED Course  Procedures (including critical care time) Labs Review Labs Reviewed  CBC WITH DIFFERENTIAL - Abnormal; Notable for the following:    Neutrophils Relative % 80 (*)    Lymphocytes Relative 10 (*)    All other components within normal limits  I-STAT CHEM 8, ED - Abnormal; Notable for the following:    BUN <3 (*)    Glucose, Bld 115 (*)    Hemoglobin 15.3 (*)    All other components within normal limits  URINALYSIS, ROUTINE W REFLEX MICROSCOPIC  PREGNANCY, URINE    Imaging Review No results found.   EKG Interpretation None     Labs reviewed.  Urine reviewed.  After 2 L of fluid, IV Toradol and by mouth Tamiflu.  Patient is feeling significantly better MDM   Final diagnoses:  Influenza         Arman FilterGail K Dosia Yodice, NP 11/14/14 0411  Hanley SeamenJohn L Molpus, MD 11/14/14 [phone]

## 2014-11-14 NOTE — ED Notes (Signed)
Pt arrived to the ED with a complaint of a fever, body aches.  Pt also states that her lower abdominal pain, with radiation to her chest and back.  Pt states she has a hx of UTI with symptoms of burning urination that came and subsided.  Pt is weak and feels warm

## 2014-12-12 ENCOUNTER — Encounter (HOSPITAL_COMMUNITY): Payer: Self-pay | Admitting: *Deleted

## 2014-12-12 ENCOUNTER — Inpatient Hospital Stay (HOSPITAL_COMMUNITY)
Admission: AD | Admit: 2014-12-12 | Discharge: 2014-12-13 | Disposition: A | Discharge status: Home / Self Care | Payer: 59 | Source: Ambulatory Visit | Attending: Obstetrics & Gynecology | Admitting: Obstetrics & Gynecology

## 2014-12-12 DIAGNOSIS — N946 Dysmenorrhea, unspecified: Secondary | ICD-10-CM | POA: Diagnosis not present

## 2014-12-12 DIAGNOSIS — F17200 Nicotine dependence, unspecified, uncomplicated: Secondary | ICD-10-CM | POA: Insufficient documentation

## 2014-12-12 DIAGNOSIS — R109 Unspecified abdominal pain: Secondary | ICD-10-CM | POA: Diagnosis present

## 2014-12-12 DIAGNOSIS — R102 Pelvic and perineal pain: Secondary | ICD-10-CM

## 2014-12-12 HISTORY — DX: Anxiety disorder, unspecified: F41.9

## 2014-12-12 HISTORY — DX: Unspecified jaundice: R17

## 2014-12-12 HISTORY — DX: Headache: R51

## 2014-12-12 HISTORY — DX: Unspecified ovarian cyst, unspecified side: N83.209

## 2014-12-12 HISTORY — DX: Joint derangement, unspecified: M24.9

## 2014-12-12 HISTORY — DX: Cardiac arrhythmia, unspecified: I49.9

## 2014-12-12 HISTORY — DX: Acute pancreatitis without necrosis or infection, unspecified: K85.90

## 2014-12-12 HISTORY — DX: Unspecified infectious disease: B99.9

## 2014-12-12 HISTORY — DX: Psoriasis, unspecified: L40.9

## 2014-12-12 HISTORY — DX: Headache, unspecified: R51.9

## 2014-12-12 HISTORY — DX: Anorexia: R63.0

## 2014-12-12 LAB — URINALYSIS, ROUTINE W REFLEX MICROSCOPIC
Bilirubin Urine: NEGATIVE
Glucose, UA: NEGATIVE mg/dL
Ketones, ur: >80 mg/dL — AB
Leukocytes, UA: NEGATIVE
NITRITE: NEGATIVE
PH: 5 (ref 5.0–8.0)
PROTEIN: NEGATIVE mg/dL
Specific Gravity, Urine: 1.01 (ref 1.005–1.030)
UROBILINOGEN UA: 0.2 mg/dL (ref 0.0–1.0)

## 2014-12-12 LAB — URINE MICROSCOPIC-ADD ON

## 2014-12-12 LAB — POCT PREGNANCY, URINE: Preg Test, Ur: NEGATIVE

## 2014-12-12 MED ORDER — HYDROMORPHONE HCL 2 MG/ML IJ SOLN
2.0000 mg | Freq: Once | INTRAMUSCULAR | Status: AC
  Administered 2014-12-13: 2 mg via INTRAMUSCULAR
  Filled 2014-12-12: qty 1

## 2014-12-12 MED ORDER — KETOROLAC TROMETHAMINE 60 MG/2ML IM SOLN
60.0000 mg | Freq: Once | INTRAMUSCULAR | Status: AC
  Administered 2014-12-12: 60 mg via INTRAMUSCULAR
  Filled 2014-12-12: qty 2

## 2014-12-12 NOTE — MAU Provider Note (Signed)
History     CSN: [id number]  Arrival date and time: 12/12/14 2041   First Provider Initiated Contact with Patient 12/12/14 2231      Chief Complaint  Patient presents with  . Flank Pain   HPI  Pt is a 34 yo female in with report of right flank pain that started 3 days ago and became unbearable today. Pain is described as a dull to stabbing pain that is constant in nature.  Pain radiates to lower right pelvis.  Pt reports vaginal bleeding that started 12/11/14. No report of abnormal vaginal discharge.    Patient's last menstrual period was 10/25/2014 (exact date).     Past Medical History  Diagnosis Date  . Gall stones   . Mastoiditis   . Ovarian cyst   . Pancreatitis   . Jaundice   . Dysrhythmia   . Anxiety   . Headache   . Psoriasis   . Hypermobility of joint   . Infection     bladder  . Anorexia     Past Surgical History  Procedure Laterality Date  . Dilation and curettage of uterus  2002  . Laparoscopic cholecystectomy      No family history on file.  History  Substance Use Topics  . Smoking status: Current Every Day Smoker -- 1.00 packs/day  . Smokeless tobacco: Never Used  . Alcohol Use: Yes     Comment: once monthly    Allergies:  Allergies  Allergen Reactions  . Sulfa Antibiotics Nausea Only  . Morphine And Related Nausea And Vomiting and Other (See Comments)    Reaction:  Chest pain    Prescriptions prior to admission  Medication Sig Dispense Refill Last Dose  . B Complex Vitamins SOLN Take 10 mLs by mouth daily.    12/12/2014 at Unknown time  . cetirizine (ZYRTEC) 10 MG tablet Take 10 mg by mouth daily.   12/12/2014 at Unknown time  . CHROMIUM PO Take 1 tablet by mouth daily.   12/12/2014 at Unknown time  . clonazePAM (KLONOPIN) 1 MG tablet Take 1 mg by mouth 2 (two) times daily as needed for anxiety.   12/12/2014 at Unknown time  . HOODIA GORDONII PO Take 2 tablets by mouth 3 (three) times daily.   12/12/2014 at Unknown time  . ibuprofen (ADVIL,MOTRIN)  200 MG tablet Take 200 mg by mouth every 6 (six) hours as needed for headache.    Past Week at Unknown time  . JOLIVETTE 0.35 MG tablet Take 1 tablet by mouth daily.    12/12/2014 at Unknown time  . Melatonin 10 MG CAPS Take 10 mg by mouth daily.   12/11/2014 at Unknown time  . Multiple Minerals-Vitamins (CALCIUM-MAGNESIUM-ZINC-D3 PO) Take 1 tablet by mouth daily.   12/12/2014 at Unknown time  . oseltamivir (TAMIFLU) 75 MG capsule Take 1 capsule (75 mg total) by mouth 2 (two) times daily. (Patient not taking: Reported on 12/12/2014) 9 capsule 0     Review of Systems  Constitutional: Positive for chills. Negative for fever.  Gastrointestinal: Positive for nausea and abdominal pain. Negative for vomiting.  Genitourinary: Positive for flank pain (right). Negative for dysuria, urgency and hematuria.   Physical Exam   Blood pressure 118/87, pulse 84, temperature 98.4 F (36.9 C), temperature source Oral, resp. rate 18, last menstrual period 10/25/2014.  Physical Exam  Constitutional: She is oriented to person, place, and time. She appears well-developed and well-nourished. No distress ( uncomfortable).  HENT:  Head: Normocephalic.  Neck: Normal  range of motion. Neck supple.  Cardiovascular: Normal rate, regular rhythm and normal heart sounds.   Respiratory: Effort normal and breath sounds normal.  GI: Soft. She exhibits no mass. There is tenderness (LRQ). There is CVA tenderness (right). There is no rebound and no guarding.  Genitourinary: Cervix exhibits no motion tenderness. Right adnexum displays no mass and no tenderness. Left adnexum displays tenderness. Left adnexum displays no mass. There is bleeding (dark, thick brown discharge) in the vagina.  Musculoskeletal: Normal range of motion. She exhibits no edema.  Neurological: She is alert and oriented to person, place, and time.  Skin: Skin is warm and dry.    MAU Course  Procedures  Results for orders placed or performed during the hospital  encounter of 12/12/14 (from the past 24 hour(s))  Urinalysis, Routine w reflex microscopic     Status: Abnormal   Collection Time: 12/12/14  9:00 PM  Result Value Ref Range   Color, Urine YELLOW YELLOW   APPearance CLEAR CLEAR   Specific Gravity, Urine 1.010 1.005 - 1.030   pH 5.0 5.0 - 8.0   Glucose, UA NEGATIVE NEGATIVE mg/dL   Hgb urine dipstick TRACE (A) NEGATIVE   Bilirubin Urine NEGATIVE NEGATIVE   Ketones, ur >80 (A) NEGATIVE mg/dL   Protein, ur NEGATIVE NEGATIVE mg/dL   Urobilinogen, UA 0.2 0.0 - 1.0 mg/dL   Nitrite NEGATIVE NEGATIVE   Leukocytes, UA NEGATIVE NEGATIVE  Urine microscopic-add on     Status: None   Collection Time: 12/12/14  9:00 PM  Result Value Ref Range   Squamous Epithelial / LPF RARE RARE   WBC, UA 0-2 <3 WBC/hpf   RBC / HPF 0-2 <3 RBC/hpf   Bacteria, UA RARE RARE  Pregnancy, urine POC     Status: None   Collection Time: 12/12/14  9:10 PM  Result Value Ref Range   Preg Test, Ur NEGATIVE NEGATIVE   Renal Ultrasound: EXAM: RENAL/URINARY TRACT ULTRASOUND COMPLETE  COMPARISON: None.  FINDINGS: Right Kidney:  Length: 11.2 cm. Echogenicity within normal limits. No mass or hydronephrosis visualized.  Left Kidney:  Length: 11.1 cm. Echogenicity within normal limits. No mass or hydronephrosis visualized.  Bladder:  No bladder wall thickening or intraluminal filling defects.  IMPRESSION: Normal ultrasound appearance of kidneys and bladder.  Pelvic Ultrasound:  IMPRESSION: Normal ultrasound appearance of the uterus and ovaries.  Assessment and Plan  Dysmenorrhea  Plan: Discharge to home RX Ibuprofen 600 mg i PO q 6 hours prn  Marlis Edelson, CNM

## 2014-12-12 NOTE — MAU Note (Signed)
Pt has been having flank pain that started 3 days ago and became unbearable today. Pt has pelvic tenderness for the past week since she felt she had a cyst rupture last weekend. Pt has had vaginal bleeding that she does not believe is menstrual bleeding because it is over a week early. Afebrile.

## 2014-12-13 ENCOUNTER — Inpatient Hospital Stay (HOSPITAL_COMMUNITY): Payer: 59

## 2014-12-13 DIAGNOSIS — N946 Dysmenorrhea, unspecified: Secondary | ICD-10-CM

## 2014-12-13 MED ORDER — IBUPROFEN 600 MG PO TABS: 600.0000 mg | ORAL_TABLET | Freq: Four times a day (QID) | ORAL | Status: DC | PRN

## 2015-01-12 ENCOUNTER — Emergency Department (INDEPENDENT_AMBULATORY_CARE_PROVIDER_SITE_OTHER)
Admission: EM | Admit: 2015-01-12 | Discharge: 2015-01-12 | Disposition: A | Discharge status: Home / Self Care | Payer: 59 | Source: Home / Self Care | Attending: Emergency Medicine | Admitting: Emergency Medicine

## 2015-01-12 ENCOUNTER — Encounter (HOSPITAL_COMMUNITY): Payer: Self-pay | Admitting: *Deleted

## 2015-01-12 DIAGNOSIS — F329 Major depressive disorder, single episode, unspecified: Secondary | ICD-10-CM

## 2015-01-12 DIAGNOSIS — F32A Depression, unspecified: Secondary | ICD-10-CM

## 2015-01-12 MED ORDER — CITALOPRAM HYDROBROMIDE 10 MG PO TABS: 10.0000 mg | ORAL_TABLET | Freq: Every day | ORAL | Status: DC

## 2015-01-12 NOTE — ED Notes (Signed)
Hx of depression since she was a teenager.  [facility] [place]- last saw her in June.  She wanted her to take antidepressants but pt. declined.  Now pt. can't function.  She has appt. with [facility] 2/9.

## 2015-01-12 NOTE — ED Provider Notes (Signed)
   Chief Complaint   No chief complaint on file.   History of Present Illness   Elaine PunSarah L Wall is a 34 year old female who has had a six-month history of gradually worsening depression since she broke up with and divorced her husband. She is now a new relationship which she states is working out well. She [place] last summer who recommended that she take an antidepressant. She decided not to do so at that time, but she feels right now that she's ready to. She describes feelings of depression, sadness, crying spells, low energy, poor sleep, and poor appetite. She has difficulty focusing and concentrating. She's also had some physical symptoms including headaches, abdominal pain, lower back pain. She denies any suicidal or homicidal ideation. Does not use alcohol or recreational drugs. She does have a prescription for clonazepam for anxiety and panic attacks which are a problem for her.  Review of Systems   Other than as noted above, the patient denies any of the following symptoms: Systemic:  No fever, chills, fatigue, weight loss or gain. Resp:  No shortness of breath. Cardiovasc:  No chest pain, palpitations, dizziness, or syncope. GI:  No abdominal pain, nausea, vomiting, anorexia, diarrhea, or constipation. Neuro:  No headache, paresthesias, or tremor. Psych:  No sadness, depression, crying, anxiety, panic, sleep disturbance, or suicidal or homicidal ideation.  No hallucinations or delusions.  PMFSH   Past medical history, family history, social history, meds, and allergies were reviewed.    Physical Examination     Vital signs:  BP 119/86 mmHg  Pulse 98  Temp(Src) 99.3 F (37.4 C) (Oral)  Resp 17  SpO2 100%  LMP 01/08/2015 Gen:  Alert, oriented, in no distress. Lungs:  No respiratory distress.  Breath sounds clear and equal bilaterally.  No wheezes, rales, or rhonchi. Heart:  Regular rthythm.  No gallops, murmers, clicks or rubs. Abdomen:   Soft, flat and nontender.  No organomegaly or mass. Neuro:  Alert and oriented times 3. Speech clear, fluent and appropriate.  Cranial nerves intact.  No focal weakness. Psych:  Mood and affect normal.  Speech pattern normal.  Thought content normal with no suicidal or homicidal ideation.  No paranoia, hallucinations, or delusions.  Memory, insight, and judgement normal.  Assessment   The encounter diagnosis was Depression.   No evidence of suicidal or homicidal ideation.  Plan   1.  Meds:  The following meds were prescribed:   Discharge Medication List as of 01/12/2015  8:26 PM    START taking these medications   Details  citalopram (CELEXA) 10 MG tablet Take 1 tablet (10 mg total) by mouth daily., Starting 01/12/2015, Until Discontinued, Normal        2.  Patient Education/Counseling:  The patient was given appropriate handouts, self care instructions, and instructed in symptomatic relief.  Suggested she continue with counseling, and seek a primary care physician or psychiatrist to can prescribe her medication on ongoing basis.  3.  Follow up:  The patient was told to follow up if no better in 3 to 4 days, if becoming worse in any way, and given some red flag symptoms such as worsening symptoms or suicidal ideation which would prompt immediate return.       Reuben Likesavid C Ayelen Sciortino, MD 01/12/15 2108

## 2015-01-12 NOTE — Discharge Instructions (Signed)

## 2015-01-19 ENCOUNTER — Encounter (HOSPITAL_COMMUNITY): Payer: Self-pay | Admitting: Emergency Medicine

## 2015-01-19 ENCOUNTER — Other Ambulatory Visit (HOSPITAL_COMMUNITY)
Admission: RE | Admit: 2015-01-19 | Discharge: 2015-01-19 | Disposition: A | Discharge status: Home / Self Care | Payer: 59 | Source: Ambulatory Visit | Attending: Family Medicine | Admitting: Family Medicine

## 2015-01-19 ENCOUNTER — Emergency Department (INDEPENDENT_AMBULATORY_CARE_PROVIDER_SITE_OTHER)
Admission: EM | Admit: 2015-01-19 | Discharge: 2015-01-19 | Disposition: A | Discharge status: Home / Self Care | Payer: 59 | Source: Home / Self Care | Attending: Family Medicine | Admitting: Family Medicine

## 2015-01-19 DIAGNOSIS — Z113 Encounter for screening for infections with a predominantly sexual mode of transmission: Secondary | ICD-10-CM | POA: Diagnosis not present

## 2015-01-19 DIAGNOSIS — N898 Other specified noninflammatory disorders of vagina: Secondary | ICD-10-CM

## 2015-01-19 DIAGNOSIS — A499 Bacterial infection, unspecified: Secondary | ICD-10-CM

## 2015-01-19 DIAGNOSIS — N760 Acute vaginitis: Secondary | ICD-10-CM | POA: Diagnosis present

## 2015-01-19 DIAGNOSIS — B9689 Other specified bacterial agents as the cause of diseases classified elsewhere: Secondary | ICD-10-CM

## 2015-01-19 LAB — POCT URINALYSIS DIP (DEVICE)
BILIRUBIN URINE: NEGATIVE
Glucose, UA: NEGATIVE mg/dL
Hgb urine dipstick: NEGATIVE
KETONES UR: 15 mg/dL — AB
LEUKOCYTES UA: NEGATIVE
NITRITE: NEGATIVE
PH: 7 (ref 5.0–8.0)
PROTEIN: NEGATIVE mg/dL
Specific Gravity, Urine: 1.01 (ref 1.005–1.030)
Urobilinogen, UA: 0.2 mg/dL (ref 0.0–1.0)

## 2015-01-19 LAB — HEPATITIS PANEL, ACUTE
HCV AB: NEGATIVE
HEP A IGM: NONREACTIVE
Hep B C IgM: NONREACTIVE
Hepatitis B Surface Ag: NEGATIVE

## 2015-01-19 MED ORDER — METRONIDAZOLE 500 MG PO TABS: 500.0000 mg | ORAL_TABLET | Freq: Two times a day (BID) | ORAL | Status: DC

## 2015-01-19 MED ORDER — FLUCONAZOLE 150 MG PO TABS: ORAL_TABLET | ORAL | Status: DC

## 2015-01-19 NOTE — Discharge Instructions (Signed)
Bacterial Vaginosis °Bacterial vaginosis is a vaginal infection that occurs when the normal balance of bacteria in the vagina is disrupted. It results from an overgrowth of certain bacteria. This is the most common vaginal infection in women of childbearing age. Treatment is important to prevent complications, especially in pregnant women, as it can cause a premature delivery. °CAUSES  °Bacterial vaginosis is caused by an increase in harmful bacteria that are normally present in smaller amounts in the vagina. Several different kinds of bacteria can cause bacterial vaginosis. However, the reason that the condition develops is not fully understood. °RISK FACTORS °Certain activities or behaviors can put you at an increased risk of developing bacterial vaginosis, including: °· Having a new sex partner or multiple sex partners. °· Douching. °· Using an intrauterine device (IUD) for contraception. °Women do not get bacterial vaginosis from toilet seats, bedding, swimming pools, or contact with objects around them. °SIGNS AND SYMPTOMS  °Some women with bacterial vaginosis have no signs or symptoms. Common symptoms include: °· Grey vaginal discharge. °· A fishlike odor with discharge, especially after sexual intercourse. °· Itching or burning of the vagina and vulva. °· Burning or pain with urination. °DIAGNOSIS  °Your health care provider will take a medical history and examine the vagina for signs of bacterial vaginosis. A sample of vaginal fluid may be taken. Your health care provider will look at this sample under a microscope to check for bacteria and abnormal cells. A vaginal pH test may also be done.  °TREATMENT  °Bacterial vaginosis may be treated with antibiotic medicines. These may be given in the form of a pill or a vaginal cream. A second round of antibiotics may be prescribed if the condition comes back after treatment.  °HOME CARE INSTRUCTIONS  °· Only take over-the-counter or prescription medicines as  directed by your health care provider. °· If antibiotic medicine was prescribed, take it as directed. Make sure you finish it even if you start to feel better. °· Do not have sex until treatment is completed. °· Tell all sexual partners that you have a vaginal infection. They should see their health care provider and be treated if they have problems, such as a mild rash or itching. °· Practice safe sex by using condoms and only having one sex partner. °SEEK MEDICAL CARE IF:  °· Your symptoms are not improving after 3 days of treatment. °· You have increased discharge or pain. °· You have a fever. °MAKE SURE YOU:  °· Understand these instructions. °· Will watch your condition. °· Will get help right away if you are not doing well or get worse. °FOR MORE INFORMATION  °Centers for Disease Control and Prevention, Division of STD Prevention: www.cdc.gov/std °American Sexual Health Association (ASHA): www.ashastd.org  °Document Released: 11/26/2005 Document Revised: 09/16/2013 Document Reviewed: 07/08/2013 °ExitCare® Patient Information ©2015 [employer]. This information is not intended to replace advice given to you by your health care provider. Make sure you discuss any questions you have with your health care provider. ° °Sexually Transmitted Disease °A sexually transmitted disease (STD) is a disease or infection that may be passed (transmitted) from person to person, usually during sexual activity. This may happen by way of saliva, semen, blood, vaginal mucus, or urine. Common STDs include:  °· Gonorrhea.   °· Chlamydia.   °· Syphilis.   °· HIV and AIDS.   °· Genital herpes.   °· Hepatitis B and C.   °· Trichomonas.   °· Human papillomavirus (HPV).   °· Pubic lice.   °· Scabies. °· Mites. °·   Bacterial vaginosis. WHAT ARE CAUSES OF STDs? An STD may be caused by bacteria, a virus, or parasites. STDs are often transmitted during sexual activity if one person is infected. However, they may also be transmitted through  nonsexual means. STDs may be transmitted after:   Sexual intercourse with an infected person.   Sharing sex toys with an infected person.   Sharing needles with an infected person or using unclean piercing or tattoo needles.  Having intimate contact with the genitals, mouth, or rectal areas of an infected person.   Exposure to infected fluids during birth. WHAT ARE THE SIGNS AND SYMPTOMS OF STDs? Different STDs have different symptoms. Some people may not have any symptoms. If symptoms are present, they may include:   Painful or bloody urination.   Pain in the pelvis, abdomen, vagina, anus, throat, or eyes.   A skin rash, itching, or irritation.  Growths, ulcerations, blisters, or sores in the genital and anal areas.  Abnormal vaginal discharge with or without bad odor.   Penile discharge in men.   Fever.   Pain or bleeding during sexual intercourse.   Swollen glands in the groin area.   Yellow skin and eyes (jaundice). This is seen with hepatitis.   Swollen testicles.  Infertility.  Sores and blisters in the mouth. HOW ARE STDs DIAGNOSED? To make a diagnosis, your health care provider may:   Take a medical history.   Perform a physical exam.   Take a sample of any discharge to examine.  Swab the throat, cervix, opening to the penis, rectum, or vagina for testing.  Test a sample of your first morning urine.   Perform blood tests.   Perform a Pap test, if this applies.   Perform a colposcopy.   Perform a laparoscopy.  HOW ARE STDs TREATED? Treatment depends on the STD. Some STDs may be treated but not cured.   Chlamydia, gonorrhea, trichomonas, and syphilis can be cured with antibiotic medicine.   Genital herpes, hepatitis, and HIV can be treated, but not cured, with prescribed medicines. The medicines lessen symptoms.   Genital warts from HPV can be treated with medicine or by freezing, burning (electrocautery), or surgery. Warts  may come back.   HPV cannot be cured with medicine or surgery. However, abnormal areas may be removed from the cervix, vagina, or vulva.   If your diagnosis is confirmed, your recent sexual partners need treatment. This is true even if they are symptom-free or have a negative culture or evaluation. They should not have sex until their health care providers say it is okay. HOW CAN I REDUCE MY RISK OF GETTING AN STD? Take these steps to reduce your risk of getting an STD:  Use latex condoms, dental dams, and water-soluble lubricants during sexual activity. Do not use petroleum jelly or oils.  Avoid having multiple sex partners.  Do not have sex with someone who has other sex partners.  Do not have sex with anyone you do not know or who is at high risk for an STD.  Avoid risky sex practices that can break your skin.  Do not have sex if you have open sores on your mouth or skin.  Avoid drinking too much alcohol or taking illegal drugs. Alcohol and drugs can affect your judgment and put you in a vulnerable position.  Avoid engaging in oral and anal sex acts.  Get vaccinated for HPV and hepatitis. If you have not received these vaccines in the past, talk to your  health care provider about whether one or both might be right for you.   If you are at risk of being infected with HIV, it is recommended that you take a prescription medicine daily to prevent HIV infection. This is called pre-exposure prophylaxis (PrEP). You are considered at risk if:  You are a man who has sex with other men (MSM).  You are a heterosexual man or woman and are sexually active with more than one partner.  You take drugs by injection.  You are sexually active with a partner who has HIV.  Talk with your health care provider about whether you are at high risk of being infected with HIV. If you choose to begin PrEP, you should first be tested for HIV. You should then be tested every 3 months for as long as you  are taking PrEP.  WHAT SHOULD I DO IF I THINK I HAVE AN STD?  See your health care provider.   Tell your sexual partner(s). They should be tested and treated for any STDs.  Do not have sex until your health care provider says it is okay. WHEN SHOULD I GET IMMEDIATE MEDICAL CARE? Contact your health care provider right away if:   You have severe abdominal pain.  You are a man and notice swelling or pain in your testicles.  You are a woman and notice swelling or pain in your vagina. Document Released: 02/16/2003 Document Revised: 12/01/2013 Document Reviewed: 06/16/2013 [facility] Patient Information 2015 [place], [place]. This information is not intended to replace advice given to you by your health care provider. Make sure you discuss any questions you have with your health care provider. Artery disease as an Vaginitis Vaginitis is an inflammation of the vagina. It is most often caused by a change in the normal balance of the bacteria and yeast that live in the vagina. This change in balance causes an overgrowth of certain bacteria or yeast, which causes the inflammation. There are different types of vaginitis, but the most common types are:  Bacterial vaginosis.  Yeast infection (candidiasis).  Trichomoniasis vaginitis. This is a sexually transmitted infection (STI).  Viral vaginitis.  Atropic vaginitis.  Allergic vaginitis. CAUSES  The cause depends on the type of vaginitis. Vaginitis can be caused by:  Bacteria (bacterial vaginosis).  Yeast (yeast infection).  A parasite (trichomoniasis vaginitis)  A virus (viral vaginitis).  Low hormone levels (atrophic vaginitis). Low hormone levels can occur during pregnancy, breastfeeding, or after menopause.  Irritants, such as bubble baths, scented tampons, and feminine sprays (allergic vaginitis). Other factors can change the normal balance of the yeast and bacteria that live in the vagina. These include:  Antibiotic  medicines.  Poor hygiene.  Diaphragms, vaginal sponges, spermicides, birth control pills, and intrauterine devices (IUD).  Sexual intercourse.  Infection.  Uncontrolled diabetes.  A weakened immune system. SYMPTOMS  Symptoms can vary depending on the cause of the vaginitis. Common symptoms include:  Abnormal vaginal discharge.  The discharge is white, gray, or yellow with bacterial vaginosis.  The discharge is thick, white, and cheesy with a yeast infection.  The discharge is frothy and yellow or greenish with trichomoniasis.  A bad vaginal odor.  The odor is fishy with bacterial vaginosis.  Vaginal itching, pain, or swelling.  Painful intercourse.  Pain or burning when urinating. Sometimes, there are no symptoms. TREATMENT  Treatment will vary depending on the type of infection.   Bacterial vaginosis and trichomoniasis are often treated with antibiotic creams or pills.  Yeast  infections are often treated with antifungal medicines, such as vaginal creams or suppositories.  Viral vaginitis has no cure, but symptoms can be treated with medicines that relieve discomfort. Your sexual partner should be treated as well.  Atrophic vaginitis may be treated with an estrogen cream, pill, suppository, or vaginal ring. If vaginal dryness occurs, lubricants and moisturizing creams may help. You may be told to avoid scented soaps, sprays, or douches.  Allergic vaginitis treatment involves quitting the use of the product that is causing the problem. Vaginal creams can be used to treat the symptoms. HOME CARE INSTRUCTIONS   Take all medicines as directed by your caregiver.  Keep your genital area clean and dry. Avoid soap and only rinse the area with water.  Avoid douching. It can remove the healthy bacteria in the vagina.  Do not use tampons or have sexual intercourse until your vaginitis has been treated. Use sanitary pads while you have vaginitis.  Wipe from front to back.  This avoids the spread of bacteria from the rectum to the vagina.  Let air reach your genital area.  Wear cotton underwear to decrease moisture buildup.  Avoid wearing underwear while you sleep until your vaginitis is gone.  Avoid tight pants and underwear or nylons without a cotton panel.  Take off wet clothing (especially bathing suits) as soon as possible.  Use mild, non-scented products. Avoid using irritants, such as:  Scented feminine sprays.  Fabric softeners.  Scented detergents.  Scented tampons.  Scented soaps or bubble baths.  Practice safe sex and use condoms. Condoms may prevent the spread of trichomoniasis and viral vaginitis. SEEK MEDICAL CARE IF:   You have abdominal pain.  You have a fever or persistent symptoms for more than 2-3 days.  You have a fever and your symptoms suddenly get worse. Document Released: 09/23/2007 Document Revised: 08/20/2012 Document Reviewed: 05/08/2012 [facility] Patient Information 2015 [place]. This information is not intended to replace advice given to you by your health care provider. Make sure you discuss any questions you have with your health care provider.  Vaginitis Vaginitis is an inflammation of the vagina. It can happen when the normal bacteria and yeast in the vagina grow too much. There are different types. Treatment will depend on the type you have. HOME CARE  Take all medicines as told by your doctor.  Keep your vagina area clean and dry. Avoid soap. Rinse the area with water.  Avoid washing and cleaning out the vagina (douching).  Do not use tampons or have sex (intercourse) until your treatment is done.  Wipe from front to back after going to the restroom.  Wear cotton underwear.  Avoid wearing underwear while you sleep until your vaginitis is gone.  Avoid tight pants. Avoid underwear or nylons without a cotton panel.  Take off wet clothing (such as a bathing suit) as soon as you can.  Use  mild, unscented products. Avoid fabric softeners and scented:  Feminine sprays.  Laundry detergents.  Tampons.  Soaps or bubble baths.  Practice safe sex and use condoms. GET HELP RIGHT AWAY IF:   You have belly (abdominal) pain.  You have a fever or lasting symptoms for more than 2-3 days.  You have a fever and your symptoms suddenly get worse. MAKE SURE YOU:   Understand these instructions.  Will watch this condition.  Will get help right away if you are not doing well or get worse. Document Released: 02/22/2009 Document Revised: 08/20/2012 Document Reviewed: 05/08/2012 [employer]  Patient Information ©2015 [employer]. This information is not intended to replace advice given to you by your health care provider. Make sure you discuss any questions you have with your health care provider. ° °

## 2015-01-19 NOTE — ED Provider Notes (Signed)
CSN: [id number]     Arrival date & time 01/19/15  1226 History   First MD Initiated Contact with Patient 01/19/15 1307     Chief Complaint  Patient presents with  . Exposure to STD   (Consider location/radiation/quality/duration/timing/severity/associated sxs/prior Treatment) HPI Comments: 34 year old female who 2 weeks ago had sex with a partner in which she has little information about. She has no symptoms but decided she wanted to get tested for communicable diseases and in particular STD. She is completely asymptomatic. She is unaware of her partner has symptoms or not.   Past Medical History  Diagnosis Date  . Gall stones   . Mastoiditis   . Ovarian cyst   . Pancreatitis   . Jaundice   . Dysrhythmia   . Anxiety   . Headache   . Psoriasis   . Hypermobility of joint     Ehler Danlos Syndrome  . Infection     bladder  . Anorexia    Past Surgical History  Procedure Laterality Date  . Dilation and curettage of uterus  2002  . Laparoscopic cholecystectomy  2006   Family History  Problem Relation Age of Onset  . Heart disease Father   . Cancer Father     skin  . Osteoarthritis Father    History  Substance Use Topics  . Smoking status: Current Every Day Smoker -- 1.50 packs/day  . Smokeless tobacco: Never Used  . Alcohol Use: Yes     Comment: once monthly   OB History    Gravida Para Term Preterm AB TAB SAB Ectopic Multiple Living   Review of Systems  All other systems reviewed and are negative.   Allergies  Sulfa antibiotics and Morphine and related  Home Medications   Prior to Admission medications   Medication Sig Start Date End Date Taking? Authorizing Provider  cetirizine (ZYRTEC) 10 MG tablet Take 10 mg by mouth daily.   Yes Historical Provider, MD  citalopram (CELEXA) 10 MG tablet Take 1 tablet (10 mg total) by mouth daily. 01/12/15  Yes Reuben Likes, MD  clonazePAM (KLONOPIN) 1 MG tablet Take 1 mg by mouth 2 (two) times daily as  needed for anxiety.   Yes Historical Provider, MD  B Complex Vitamins SOLN Take 10 mLs by mouth daily.     Historical Provider, MD  CHROMIUM PO Take 1 tablet by mouth daily.    Historical Provider, MD  fluconazole (DIFLUCAN) 150 MG tablet 1 tab po x 1. May repeat in 72 hours if no improvement 01/19/15   Hayden Rasmussen, NP  HOODIA GORDONII PO Take 2 tablets by mouth 3 (three) times daily.    Historical Provider, MD  ibuprofen (ADVIL,MOTRIN) 600 MG tablet Take 1 tablet (600 mg total) by mouth every 6 (six) hours as needed. 12/13/14   Marlis Edelson, CNM  JOLIVETTE 0.35 MG tablet Take 1 tablet by mouth daily.  11/01/14   Historical Provider, MD  Melatonin 10 MG CAPS Take 10 mg by mouth daily.    Historical Provider, MD  metroNIDAZOLE (FLAGYL) 500 MG tablet Take 1 tablet (500 mg total) by mouth 2 (two) times daily. X 7 days 01/19/15   Hayden Rasmussen, NP  Multiple Minerals-Vitamins (CALCIUM-MAGNESIUM-ZINC-D3 PO) Take 1 tablet by mouth daily.    Historical Provider, MD   BP 117/81 mmHg  Pulse 91  Temp(Src) 98.3 F (36.8 C) (Oral)  Resp 20  SpO2  96%  LMP 01/08/2015 Physical Exam  Constitutional: She is oriented to person, place, and time. She appears well-developed and well-nourished. No distress.  Pulmonary/Chest: Effort normal. No respiratory distress.  Genitourinary: Vaginal discharge found.  Normal external female genitalia There is a copious amount of thin white vaginal discharge. Cervix is right and posterior of the midline. The ectocervix is without lesions and with minor patchy erythema. No fluid exuding from the os. Bimanual. No adnexal tenderness. Due to position of the cervix unable to palpate. There is no overlying anterior pelvic tenderness.  Neurological: She is alert and oriented to person, place, and time.  Skin: Skin is warm and dry.  Psychiatric: She has a normal mood and affect.  Nursing note and vitals reviewed.   ED Course  Procedures (including critical care time) Labs  Review Labs Reviewed  POCT URINALYSIS DIP (DEVICE) - Abnormal; Notable for the following:    Ketones, ur 15 (*)    All other components within normal limits  HIV ANTIBODY (ROUTINE TESTING)  HEPATITIS PANEL, ACUTE  CERVICOVAGINAL ANCILLARY ONLY    Imaging Review No results found.   MDM   1. Vaginal discharge   2. BV (bacterial vaginosis)   3. Screening for STD (sexually transmitted disease)    Flagyl 500 mg twice a day Diflucan 150 mg #2 tabs Cervical cytology pending, HIV and hep panel pending. Instructions for STDs, BV and vaginitis.   Hayden Rasmussenavid [place] Oborn, NP 01/19/15 1415

## 2015-01-19 NOTE — ED Notes (Signed)
Would like to be tested for STD due to unprotected sex. She is asymptomatic Alert, no signs of acute distress.

## 2015-01-19 NOTE — ED Notes (Signed)
Call back number verified.  

## 2015-01-20 LAB — CERVICOVAGINAL ANCILLARY ONLY
Chlamydia: NEGATIVE
Neisseria Gonorrhea: NEGATIVE
WET PREP (BD AFFIRM): NEGATIVE
WET PREP (BD AFFIRM): NEGATIVE
Wet Prep (BD Affirm): NEGATIVE

## 2015-01-20 LAB — HIV ANTIBODY (ROUTINE TESTING W REFLEX): HIV Screen 4th Generation wRfx: NONREACTIVE

## 2015-01-22 NOTE — ED Notes (Signed)
Patient called to inquire about her test reports. After verifying ID, discussed negative reports

## 2015-01-31 ENCOUNTER — Emergency Department (INDEPENDENT_AMBULATORY_CARE_PROVIDER_SITE_OTHER)
Admission: EM | Admit: 2015-01-31 | Discharge: 2015-01-31 | Disposition: A | Discharge status: Home / Self Care | Payer: 59 | Source: Home / Self Care | Attending: Emergency Medicine | Admitting: Emergency Medicine

## 2015-01-31 ENCOUNTER — Encounter (HOSPITAL_COMMUNITY): Payer: Self-pay

## 2015-01-31 DIAGNOSIS — R300 Dysuria: Secondary | ICD-10-CM

## 2015-01-31 LAB — POCT URINALYSIS DIP (DEVICE)
BILIRUBIN URINE: NEGATIVE
Glucose, UA: NEGATIVE mg/dL
Ketones, ur: NEGATIVE mg/dL
NITRITE: NEGATIVE
Protein, ur: NEGATIVE mg/dL
SPECIFIC GRAVITY, URINE: 1.01 (ref 1.005–1.030)
Urobilinogen, UA: 0.2 mg/dL (ref 0.0–1.0)
pH: 7 (ref 5.0–8.0)

## 2015-01-31 LAB — POCT PREGNANCY, URINE: Preg Test, Ur: NEGATIVE

## 2015-01-31 MED ORDER — FLUCONAZOLE 150 MG PO TABS: 150.0000 mg | ORAL_TABLET | Freq: Every day | ORAL | Status: DC

## 2015-01-31 MED ORDER — NITROFURANTOIN MONOHYD MACRO 100 MG PO CAPS: 100.0000 mg | ORAL_CAPSULE | Freq: Two times a day (BID) | ORAL | Status: DC

## 2015-01-31 NOTE — ED Notes (Signed)
Reported history of frequent UTI, "feels like another one" since this AM. Pain w urination, reported cloudy UA

## 2015-01-31 NOTE — ED Provider Notes (Signed)
CSN: [id number]     Arrival date & time 01/31/15  1144 History   First MD Initiated Contact with Patient 01/31/15 1327     Chief Complaint  Patient presents with  . Urinary Tract Infection   (Consider location/radiation/quality/duration/timing/severity/associated sxs/prior Treatment) Patient is a 34 y.o. female presenting with dysuria. The history is provided by the patient.  Dysuria Pain severity:  Mild Onset quality:  Gradual Duration:  8 hours Timing:  Constant Progression:  Unchanged Chronicity:  New Recent urinary tract infections: yes (states she gets about 3 UTIs per year)   Urinary symptoms: frequent urination   Urinary symptoms: no discolored urine, no foul-smelling urine, no hematuria, no hesitancy and no bladder incontinence   Associated symptoms: no abdominal pain, no fever, no flank pain, no genital lesions, no nausea, no vaginal discharge and no vomiting   Risk factors: sexually active     Past Medical History  Diagnosis Date  . Gall stones   . Mastoiditis   . Ovarian cyst   . Pancreatitis   . Jaundice   . Dysrhythmia   . Anxiety   . Headache   . Psoriasis   . Hypermobility of joint     Ehler Danlos Syndrome  . Infection     bladder  . Anorexia    Past Surgical History  Procedure Laterality Date  . Dilation and curettage of uterus  2002  . Laparoscopic cholecystectomy  2006   Family History  Problem Relation Age of Onset  . Heart disease Father   . Cancer Father     skin  . Osteoarthritis Father    History  Substance Use Topics  . Smoking status: Current Every Day Smoker -- 1.50 packs/day  . Smokeless tobacco: Never Used  . Alcohol Use: Yes     Comment: once monthly   OB History    Gravida Para Term Preterm AB TAB SAB Ectopic Multiple Living   1    1  1         Review of Systems  Constitutional: Negative for fever, chills and fatigue.  Respiratory: Negative.   Cardiovascular: Negative.   Gastrointestinal: Negative for nausea, vomiting,  abdominal pain, diarrhea, constipation and abdominal distention.  Genitourinary: Positive for dysuria, urgency and frequency. Negative for hematuria, flank pain, decreased urine volume, vaginal bleeding, vaginal discharge, difficulty urinating, vaginal pain and pelvic pain.  Musculoskeletal: Negative for back pain.  Skin: Negative.     Allergies  Sulfa antibiotics and Morphine and related  Home Medications   Prior to Admission medications   Medication Sig Start Date End Date Taking? Authorizing Provider  B Complex Vitamins SOLN Take 10 mLs by mouth daily.     Historical Provider, MD  cetirizine (ZYRTEC) 10 MG tablet Take 10 mg by mouth daily.    Historical Provider, MD  CHROMIUM PO Take 1 tablet by mouth daily.    Historical Provider, MD  citalopram (CELEXA) 10 MG tablet Take 1 tablet (10 mg total) by mouth daily. 01/12/15   Reuben Likesavid C Keller, MD  clonazePAM (KLONOPIN) 1 MG tablet Take 1 mg by mouth 2 (two) times daily as needed for anxiety.    Historical Provider, MD  fluconazole (DIFLUCAN) 150 MG tablet Take 1 tablet (150 mg total) by mouth daily. 01/31/15   Mathis FareJennifer Lee H Elizebeth Kluesner, PA  HOODIA GORDONII PO Take 2 tablets by mouth 3 (three) times daily.    Historical Provider, MD  ibuprofen (ADVIL,MOTRIN) 600 MG tablet Take 1 tablet (600 mg total) by mouth  every 6 (six) hours as needed. 12/13/14   Marlis Edelson, CNM  JOLIVETTE 0.35 MG tablet Take 1 tablet by mouth daily.  11/01/14   Historical Provider, MD  Melatonin 10 MG CAPS Take 10 mg by mouth daily.    Historical Provider, MD  metroNIDAZOLE (FLAGYL) 500 MG tablet Take 1 tablet (500 mg total) by mouth 2 (two) times daily. X 7 days 01/19/15   Hayden Rasmussen, NP  Multiple Minerals-Vitamins (CALCIUM-MAGNESIUM-ZINC-D3 PO) Take 1 tablet by mouth daily.    Historical Provider, MD  nitrofurantoin, macrocrystal-monohydrate, (MACROBID) 100 MG capsule Take 1 capsule (100 mg total) by mouth 2 (two) times daily. 01/31/15   Mathis Fare Jennipher Weatherholtz, PA   BP  122/82 mmHg  Pulse 80  Temp(Src) 98.4 F (36.9 C) (Oral)  Resp 12  SpO2 100%  LMP 01/08/2015 Physical Exam  Constitutional: She is oriented to person, place, and time. She appears well-developed and well-nourished. No distress.  HENT:  Head: Normocephalic and atraumatic.  Eyes: Conjunctivae are normal.  Cardiovascular: Normal rate, regular rhythm and normal heart sounds.   Pulmonary/Chest: Effort normal and breath sounds normal.  Abdominal: Soft. Normal appearance and bowel sounds are normal. She exhibits no distension. There is no tenderness. There is no CVA tenderness.  Musculoskeletal: Normal range of motion.  Neurological: She is alert and oriented to person, place, and time.  Skin: Skin is warm and dry.  Psychiatric: She has a normal mood and affect. Her behavior is normal.  Nursing note and vitals reviewed.   ED Course  Procedures (including critical care time) Labs Review Labs Reviewed  POCT URINALYSIS DIP (DEVICE) - Abnormal; Notable for the following:    Hgb urine dipstick SMALL (*)    Leukocytes, UA MODERATE (*)    All other components within normal limits  URINE CULTURE  POCT PREGNANCY, URINE    Imaging Review No results found.   MDM   1. Dysuria    Urine sent for C&S Macrobid x 5 days Encouraged to locate PCP for follow up and additional routine medical needs   Ria Clock, PA 01/31/15 1439

## 2015-02-01 ENCOUNTER — Encounter (HOSPITAL_COMMUNITY): Payer: Self-pay

## 2015-02-01 ENCOUNTER — Emergency Department (HOSPITAL_COMMUNITY): Payer: 59

## 2015-02-01 ENCOUNTER — Emergency Department (HOSPITAL_COMMUNITY)
Admission: EM | Admit: 2015-02-01 | Discharge: 2015-02-01 | Disposition: A | Discharge status: Home / Self Care | Payer: 59 | Attending: Emergency Medicine | Admitting: Emergency Medicine

## 2015-02-01 DIAGNOSIS — J209 Acute bronchitis, unspecified: Secondary | ICD-10-CM | POA: Insufficient documentation

## 2015-02-01 DIAGNOSIS — Z8739 Personal history of other diseases of the musculoskeletal system and connective tissue: Secondary | ICD-10-CM | POA: Diagnosis not present

## 2015-02-01 DIAGNOSIS — R05 Cough: Secondary | ICD-10-CM | POA: Diagnosis present

## 2015-02-01 DIAGNOSIS — F419 Anxiety disorder, unspecified: Secondary | ICD-10-CM | POA: Diagnosis not present

## 2015-02-01 DIAGNOSIS — Z87448 Personal history of other diseases of urinary system: Secondary | ICD-10-CM | POA: Diagnosis not present

## 2015-02-01 DIAGNOSIS — Z8742 Personal history of other diseases of the female genital tract: Secondary | ICD-10-CM | POA: Diagnosis not present

## 2015-02-01 DIAGNOSIS — Z872 Personal history of diseases of the skin and subcutaneous tissue: Secondary | ICD-10-CM | POA: Diagnosis not present

## 2015-02-01 DIAGNOSIS — Z8719 Personal history of other diseases of the digestive system: Secondary | ICD-10-CM | POA: Insufficient documentation

## 2015-02-01 DIAGNOSIS — Z720 Tobacco use: Secondary | ICD-10-CM | POA: Diagnosis not present

## 2015-02-01 DIAGNOSIS — Z8679 Personal history of other diseases of the circulatory system: Secondary | ICD-10-CM | POA: Diagnosis not present

## 2015-02-01 DIAGNOSIS — Z8669 Personal history of other diseases of the nervous system and sense organs: Secondary | ICD-10-CM | POA: Diagnosis not present

## 2015-02-01 DIAGNOSIS — J40 Bronchitis, not specified as acute or chronic: Secondary | ICD-10-CM

## 2015-02-01 DIAGNOSIS — R0602 Shortness of breath: Secondary | ICD-10-CM

## 2015-02-01 DIAGNOSIS — Z79899 Other long term (current) drug therapy: Secondary | ICD-10-CM | POA: Insufficient documentation

## 2015-02-01 LAB — URINE CULTURE: Colony Count: 3000

## 2015-02-01 MED ORDER — HYDROCODONE-ACETAMINOPHEN 5-325 MG PO TABS
1.0000 | ORAL_TABLET | Freq: Once | ORAL | Status: AC
  Administered 2015-02-01: 1 via ORAL
  Filled 2015-02-01: qty 1

## 2015-02-01 MED ORDER — LEVOFLOXACIN 500 MG PO TABS: 500.0000 mg | ORAL_TABLET | Freq: Every day | ORAL | Status: DC

## 2015-02-01 MED ORDER — HYDROCODONE-ACETAMINOPHEN 5-325 MG PO TABS: 1.0000 | ORAL_TABLET | Freq: Once | ORAL | Status: DC

## 2015-02-01 MED ORDER — HYDROCODONE-ACETAMINOPHEN 5-325 MG PO TABS: 1.0000 | ORAL_TABLET | Freq: Four times a day (QID) | ORAL | Status: DC | PRN

## 2015-02-01 NOTE — ED Notes (Signed)
Patient c/o cough and fever since yesterday. Patient went to an UC yesterday for a UTI and was given Macrobid in which she has already started. Patient stated she had a temp of 102.8 and took Tylenol prior to coming to the ED.

## 2015-02-01 NOTE — Discharge Instructions (Signed)
Drink plenty of fluids.   Follow up if not improving.   Stop macrodantin

## 2015-02-01 NOTE — ED Provider Notes (Signed)
CSN: [id number]     Arrival date & time 02/01/15  1021 History   First MD Initiated Contact with Patient 02/01/15 1051     Chief Complaint  Patient presents with  . Fever  . Cough     (Consider location/radiation/quality/duration/timing/severity/associated sxs/prior Treatment) Patient is a 34 y.o. female presenting with cough. The history is provided by the patient (pt complains of cough and fever).  Cough Cough characteristics:  Non-productive Severity:  Moderate Onset quality:  Sudden Timing:  Constant Progression:  Waxing and waning Chronicity:  New Context: not animal exposure   Associated symptoms: no chest pain, no eye discharge, no headaches and no rash     Past Medical History  Diagnosis Date  . Gall stones   . Mastoiditis   . Ovarian cyst   . Pancreatitis   . Jaundice   . Dysrhythmia   . Anxiety   . Headache   . Psoriasis   . Hypermobility of joint     Ehler Danlos Syndrome  . Infection     bladder  . Anorexia    Past Surgical History  Procedure Laterality Date  . Dilation and curettage of uterus  2002  . Laparoscopic cholecystectomy  2006   Family History  Problem Relation Age of Onset  . Heart disease Father   . Cancer Father     skin  . Osteoarthritis Father    History  Substance Use Topics  . Smoking status: Current Every Day Smoker -- 1.00 packs/day    Types: Cigarettes  . Smokeless tobacco: Never Used  . Alcohol Use: Yes     Comment: once monthly   OB History    Gravida Para Term Preterm AB TAB SAB Ectopic Multiple Living   Review of Systems  Constitutional: Negative for appetite change and fatigue.  HENT: Negative for congestion, ear discharge and sinus pressure.   Eyes: Negative for discharge.  Respiratory: Positive for cough.   Cardiovascular: Negative for chest pain.  Gastrointestinal: Negative for abdominal pain and diarrhea.  Genitourinary: Negative for frequency and hematuria.  Musculoskeletal: Negative  for back pain.  Skin: Negative for rash.  Neurological: Negative for seizures and headaches.  Psychiatric/Behavioral: Negative for hallucinations.      Allergies  Sulfa antibiotics and Morphine and related  Home Medications   Prior to Admission medications   Medication Sig Start Date End Date Taking? Authorizing Provider  B Complex Vitamins SOLN Take 10 mLs by mouth daily.    Yes Historical Provider, MD  cetirizine (ZYRTEC) 10 MG tablet Take 10 mg by mouth daily.   Yes Historical Provider, MD  CHROMIUM PO Take 1 tablet by mouth daily.   Yes Historical Provider, MD  citalopram (CELEXA) 20 MG tablet Take 20 mg by mouth daily.   Yes Historical Provider, MD  clonazePAM (KLONOPIN) 1 MG tablet Take 1 mg by mouth 2 (two) times daily as needed for anxiety.   Yes Historical Provider, MD  fluconazole (DIFLUCAN) 150 MG tablet Take 1 tablet (150 mg total) by mouth daily. 01/31/15  Yes Jennifer Lee H Presson, PA  HOODIA GORDONII PO Take 2 tablets by mouth 3 (three) times daily.   Yes Historical Provider, MD  ibuprofen (ADVIL,MOTRIN) 600 MG tablet Take 1 tablet (600 mg total) by mouth every 6 (six) hours as needed. Patient taking differently: Take 600 mg by mouth every 6 (six) hours as needed for moderate pain.  12/13/14  Yes Marlis EdelsonWalidah N Karim, CNM  Melatonin 10 MG CAPS Take 10 mg by mouth daily.   Yes Historical Provider, MD  Multiple Minerals-Vitamins (CALCIUM-MAGNESIUM-ZINC-D3 PO) Take 1 tablet by mouth daily.   Yes Historical Provider, MD  nitrofurantoin, macrocrystal-monohydrate, (MACROBID) 100 MG capsule Take 1 capsule (100 mg total) by mouth 2 (two) times daily. Patient taking differently: Take 100 mg by mouth 2 (two) times daily. For 7 days 01/31/15  Yes Ria ClockJennifer Lee H Presson, PA  citalopram (CELEXA) 10 MG tablet Take 1 tablet (10 mg total) by mouth daily. Patient not taking: Reported on 02/01/2015 01/12/15   Reuben Likesavid C Keller, MD  HYDROcodone-acetaminophen (NORCO/VICODIN) 5-325 MG per tablet Take 1  tablet by mouth every 6 (six) hours as needed for moderate pain. 02/01/15   Benny LennertJoseph L Bohdan Macho, MD  levofloxacin (LEVAQUIN) 500 MG tablet Take 1 tablet (500 mg total) by mouth daily. 02/01/15   Benny LennertJoseph L Miryah Ralls, MD  metroNIDAZOLE (FLAGYL) 500 MG tablet Take 1 tablet (500 mg total) by mouth 2 (two) times daily. X 7 days Patient not taking: Reported on 02/01/2015 01/19/15   Hayden Rasmussenavid Mabe, NP   BP 131/86 mmHg  Pulse 115  Temp(Src) 102.1 F (38.9 C) (Oral)  Resp 20  SpO2 97%  LMP 01/10/2015 Physical Exam  Constitutional: She is oriented to person, place, and time. She appears well-developed.  HENT:  Head: Normocephalic.  Eyes: Conjunctivae and EOM are normal. No scleral icterus.  Neck: Neck supple. No thyromegaly present.  Cardiovascular: Normal rate and regular rhythm.  Exam reveals no gallop and no friction rub.   No murmur heard. Pulmonary/Chest: No stridor. She has no wheezes. She has no rales. She exhibits no tenderness.  Abdominal: She exhibits no distension. There is no tenderness. There is no rebound.  Musculoskeletal: Normal range of motion. She exhibits no edema.  Lymphadenopathy:    She has no cervical adenopathy.  Neurological: She is oriented to person, place, and time. She exhibits normal muscle tone. Coordination normal.  Skin: No rash noted. No erythema.  Psychiatric: She has a normal mood and affect. Her behavior is normal.    ED Course  Procedures (including critical care time) Labs Review Labs Reviewed - No data to display  Imaging Review Dg Chest 2 View  02/01/2015   CLINICAL DATA:  Cough, congestion for 4 days  EXAM: CHEST  2 VIEW  COMPARISON:  02/21/2004  FINDINGS: Cardiomediastinal silhouette is stable. No acute infiltrate or pleural effusion. No pulmonary edema. Bony thorax is unremarkable.  IMPRESSION: No active cardiopulmonary disease.   Electronically Signed   By: Natasha MeadLiviu  Pop M.D.   On: 02/01/2015 12:17     EKG Interpretation None      MDM   Final diagnoses:   SOB (shortness of breath)  Bronchitis    Bronchitis and uti,  tx with levaquin    Benny LennertJoseph L Mattilyn Crites, MD 02/01/15 [phone]

## 2015-02-01 NOTE — ED Notes (Signed)
Pt reports taking 400mg  ibuprofen prior to arrival.  Sts cough started after visit to urgent care yesterday.

## 2015-02-25 ENCOUNTER — Other Ambulatory Visit: Payer: Self-pay | Admitting: Obstetrics and Gynecology

## 2015-04-24 ENCOUNTER — Encounter (HOSPITAL_COMMUNITY): Payer: Self-pay | Admitting: *Deleted

## 2015-04-24 ENCOUNTER — Emergency Department (HOSPITAL_COMMUNITY): Payer: 59

## 2015-04-24 ENCOUNTER — Emergency Department (HOSPITAL_COMMUNITY)
Admission: EM | Admit: 2015-04-24 | Discharge: 2015-04-24 | Disposition: A | Discharge status: Home / Self Care | Payer: 59 | Attending: Emergency Medicine | Admitting: Emergency Medicine

## 2015-04-24 DIAGNOSIS — H43392 Other vitreous opacities, left eye: Secondary | ICD-10-CM | POA: Diagnosis not present

## 2015-04-24 DIAGNOSIS — H5712 Ocular pain, left eye: Secondary | ICD-10-CM | POA: Diagnosis not present

## 2015-04-24 DIAGNOSIS — Z720 Tobacco use: Secondary | ICD-10-CM | POA: Insufficient documentation

## 2015-04-24 DIAGNOSIS — Z79899 Other long term (current) drug therapy: Secondary | ICD-10-CM | POA: Insufficient documentation

## 2015-04-24 DIAGNOSIS — Z872 Personal history of diseases of the skin and subcutaneous tissue: Secondary | ICD-10-CM | POA: Diagnosis not present

## 2015-04-24 DIAGNOSIS — Z8742 Personal history of other diseases of the female genital tract: Secondary | ICD-10-CM | POA: Insufficient documentation

## 2015-04-24 DIAGNOSIS — H538 Other visual disturbances: Secondary | ICD-10-CM | POA: Diagnosis present

## 2015-04-24 DIAGNOSIS — G43909 Migraine, unspecified, not intractable, without status migrainosus: Secondary | ICD-10-CM | POA: Insufficient documentation

## 2015-04-24 DIAGNOSIS — I499 Cardiac arrhythmia, unspecified: Secondary | ICD-10-CM | POA: Diagnosis not present

## 2015-04-24 DIAGNOSIS — Z8719 Personal history of other diseases of the digestive system: Secondary | ICD-10-CM | POA: Diagnosis not present

## 2015-04-24 DIAGNOSIS — F419 Anxiety disorder, unspecified: Secondary | ICD-10-CM | POA: Diagnosis not present

## 2015-04-24 DIAGNOSIS — M419 Scoliosis, unspecified: Secondary | ICD-10-CM | POA: Diagnosis not present

## 2015-04-24 DIAGNOSIS — H539 Unspecified visual disturbance: Secondary | ICD-10-CM

## 2015-04-24 DIAGNOSIS — H53149 Visual discomfort, unspecified: Secondary | ICD-10-CM | POA: Diagnosis not present

## 2015-04-24 DIAGNOSIS — Z87448 Personal history of other diseases of urinary system: Secondary | ICD-10-CM | POA: Diagnosis not present

## 2015-04-24 HISTORY — DX: Scoliosis, unspecified: M41.9

## 2015-04-24 HISTORY — DX: Migraine, unspecified, not intractable, without status migrainosus: G43.909

## 2015-04-24 HISTORY — DX: Lesion of plantar nerve, unspecified lower limb: G57.60

## 2015-04-24 MED ORDER — FLUORESCEIN SODIUM 1 MG OP STRP
1.0000 | ORAL_STRIP | Freq: Once | OPHTHALMIC | Status: AC
  Administered 2015-04-24: 1 via OPHTHALMIC
  Filled 2015-04-24: qty 1

## 2015-04-24 MED ORDER — LORAZEPAM 2 MG/ML IJ SOLN
1.5000 mg | Freq: Once | INTRAMUSCULAR | Status: AC
  Administered 2015-04-24: 1.5 mg via INTRAMUSCULAR
  Filled 2015-04-24: qty 1

## 2015-04-24 MED ORDER — TETRACAINE HCL 0.5 % OP SOLN
2.0000 [drp] | Freq: Once | OPHTHALMIC | Status: AC
  Administered 2015-04-24: 2 [drp] via OPHTHALMIC
  Filled 2015-04-24: qty 2

## 2015-04-24 NOTE — Discharge Instructions (Signed)
Please call ophthalmology office tomorrow and follow-up with them as soon as they can get you in. Return if worsening symptoms.   Blurred Vision You have been seen today complaining of blurred vision. This means you have a loss of ability to see small details.  CAUSES  Blurred vision can be a symptom of underlying eye problems, such as:  Aging of the eye (presbyopia).  Glaucoma.  Cataracts.  Eye infection.  Eye-related migraine.  Diabetes mellitus.  Fatigue.  Migraine headaches.  High blood pressure.  Breakdown of the back of the eye (macular degeneration).  Problems caused by some medications. The most common cause of blurred vision is the need for eyeglasses or a new prescription. Today in the emergency department, no cause for your blurred vision can be found. SYMPTOMS  Blurred vision is the loss of visual sharpness and detail (acuity). DIAGNOSIS  Should blurred vision continue, you should see your caregiver. If your caregiver is your primary care physician, he or she may choose to refer you to another specialist.  TREATMENT  Do not ignore your blurred vision. Make sure to have it checked out to see if further treatment or referral is necessary. SEEK MEDICAL CARE IF:  You are unable to get into a specialist so we can help you with a referral. SEEK IMMEDIATE MEDICAL CARE IF: You have severe eye pain, severe headache, or sudden loss of vision. MAKE SURE YOU:   Understand these instructions.  Will watch your condition.  Will get help right away if you are not doing well or get worse. Document Released: 11/29/2003 Document Revised: 02/18/2012 Document Reviewed: 06/30/2008 [facility] Patient Information 2015 [employer], [place]. This information is not intended to replace advice given to you by your health care provider. Make sure you discuss any questions you have with your health care provider.

## 2015-04-24 NOTE — ED Notes (Signed)
Pt states that she has a blind spot in her field of vision to her left eye; pt states that she feels like her vision is blurry to her left eye; pt states that she has a hx of ocular migraines but denies headache or migraine for the last 4 days; pt c/o light sensitivity to left eye; pt states that she hit her eye approx 2 weeks ago but didn't have vision loss until 4 days ago

## 2015-04-24 NOTE — ED Provider Notes (Signed)
CSN: [id number]     Arrival date & time 04/24/15  1925 History   First MD Initiated Contact with Patient 04/24/15 2000     Chief Complaint  Patient presents with  . Blind Spot to Eye      (Consider location/radiation/quality/duration/timing/severity/associated sxs/prior Treatment) HPI Elaine [name] is a 34 y.o. female presents to ED with complaint of blurry vision in left eye. Pt states symptoms started 4 days ago. Pt states she noticed that there is a spot in the left side of her visual field at that time. States the spot is there independent of closing or opening either one of the eyes. She also reports generalized blurred vision but only when left eye is open. She states she did hit her eye in her sleep 2 wks ago, had pain for about 6 hrs at that time and then it resolved. Pt denies injuries since then. States left eye is sensitive to light and it is burning. She states she does have history of visual migraines, states usually gets "squiggly lines in my visual field. "She states this is different from that. She denies any other associated symptoms. No headache.  Past Medical History  Diagnosis Date  . Gall stones   . Mastoiditis   . Ovarian cyst   . Pancreatitis   . Jaundice   . Dysrhythmia   . Anxiety   . Headache   . Psoriasis   . Hypermobility of joint     Ehler Danlos Syndrome  . Infection     bladder  . Anorexia   . Migraine   . Cirrhosis   . Morton neuroma   . Scoliosis    Past Surgical History  Procedure Laterality Date  . Dilation and curettage of uterus  2002  . Laparoscopic cholecystectomy  2006   Family History  Problem Relation Age of Onset  . Heart disease Father   . Cancer Father     skin  . Osteoarthritis Father    History  Substance Use Topics  . Smoking status: Current Every Day Smoker -- 1.00 packs/day    Types: Cigarettes  . Smokeless tobacco: Never Used  . Alcohol Use: Yes     Comment: once monthly   OB History    Gravida Para Term Preterm  AB TAB SAB Ectopic Multiple Living   Review of Systems  Constitutional: Negative for fever and chills.  HENT: Negative.   Eyes: Positive for photophobia, pain and visual disturbance. Negative for discharge and redness.  Respiratory: Negative for cough, chest tightness and shortness of breath.   Cardiovascular: Negative for chest pain, palpitations and leg swelling.  Musculoskeletal: Negative for myalgias, arthralgias, neck pain and neck stiffness.  Skin: Negative for rash.  Neurological: Negative for dizziness, weakness and headaches.  All other systems reviewed and are negative.     Allergies  Sulfa antibiotics and Morphine and related  Home Medications   Prior to Admission medications   Medication Sig Start Date End Date Taking? Authorizing Provider  B Complex Vitamins SOLN Take 10 mLs by mouth daily.     Historical Provider, MD  cetirizine (ZYRTEC) 10 MG tablet Take 10 mg by mouth daily.    Historical Provider, MD  CHROMIUM PO Take 1 tablet by mouth daily.    Historical Provider, MD  citalopram (CELEXA) 10 MG tablet Take 1 tablet (10 mg total) by mouth daily. Patient not taking: Reported on 02/01/2015  01/12/15   Reuben Likesavid C Keller, MD  citalopram (CELEXA) 20 MG tablet Take 20 mg by mouth daily.    Historical Provider, MD  clonazePAM (KLONOPIN) 1 MG tablet Take 1 mg by mouth 2 (two) times daily as needed for anxiety.    Historical Provider, MD  fluconazole (DIFLUCAN) 150 MG tablet Take 1 tablet (150 mg total) by mouth daily. 01/31/15   Mathis FareJennifer Lee H Presson, PA  HOODIA GORDONII PO Take 2 tablets by mouth 3 (three) times daily.    Historical Provider, MD  HYDROcodone-acetaminophen (NORCO/VICODIN) 5-325 MG per tablet Take 1 tablet by mouth every 6 (six) hours as needed for moderate pain. 02/01/15   Bethann BerkshireJoseph Zammit, MD  ibuprofen (ADVIL,MOTRIN) 600 MG tablet Take 1 tablet (600 mg total) by mouth every 6 (six) hours as needed. Patient taking differently: Take 600 mg by mouth  every 6 (six) hours as needed for moderate pain.  12/13/14   Marlis EdelsonWalidah N Karim, CNM  levofloxacin (LEVAQUIN) 500 MG tablet Take 1 tablet (500 mg total) by mouth daily. 02/01/15   Bethann BerkshireJoseph Zammit, MD  Melatonin 10 MG CAPS Take 10 mg by mouth daily.    Historical Provider, MD  metroNIDAZOLE (FLAGYL) 500 MG tablet Take 1 tablet (500 mg total) by mouth 2 (two) times daily. X 7 days Patient not taking: Reported on 02/01/2015 01/19/15   Hayden Rasmussenavid Mabe, NP  Multiple Minerals-Vitamins (CALCIUM-MAGNESIUM-ZINC-D3 PO) Take 1 tablet by mouth daily.    Historical Provider, MD  nitrofurantoin, macrocrystal-monohydrate, (MACROBID) 100 MG capsule Take 1 capsule (100 mg total) by mouth 2 (two) times daily. Patient taking differently: Take 100 mg by mouth 2 (two) times daily. For 7 days 01/31/15   Jess BartersJennifer Lee H Presson, PA   BP 138/94 mmHg  Pulse 107  Temp(Src) 97.6 F (36.4 C) (Oral)  Resp 20  SpO2 100%  LMP 03/26/2015 Physical Exam  Constitutional: She is oriented to person, place, and time. She appears well-developed and well-nourished. No distress.  HENT:  Head: Normocephalic and atraumatic.  Eyes: Conjunctivae, EOM and lids are normal. Pupils are equal, round, and reactive to light. Lids are everted and swept, no foreign bodies found.  Slit lamp exam:      The right eye shows no hyphema and no hypopyon.       The left eye shows no corneal abrasion, no corneal flare, no corneal ulcer, no foreign body, no hyphema and no hypopyon.  Neck: Normal range of motion. Neck supple.  Cardiovascular: Normal rate, regular rhythm and normal heart sounds.   Pulmonary/Chest: Effort normal and breath sounds normal. No respiratory distress. She has no wheezes. She has no rales.  Neurological: She is alert and oriented to person, place, and time.  Skin: Skin is warm and dry.  Nursing note and vitals reviewed.   ED Course  Procedures (including critical care time) Labs Review Labs Reviewed - No data to display  Imaging  Review Mr Brain Wo Contrast  04/24/2015   CLINICAL DATA:  LEFT eye blind spot, history of ocular migraines. LEFT eye photophobia. LEFT eye trauma 2 weeks ago. Vision loss 4 days ago. Assess for multiple sclerosis. History of pancreatitis and cirrhosis, migraine, headache.  EXAM: MRI HEAD WITHOUT CONTRAST  TECHNIQUE: Multiplanar, multiecho pulse sequences of the brain and surrounding structures were obtained without intravenous contrast.  COMPARISON:  None.  FINDINGS: Ventricles are normal for age. Disproportionate mild diffuse sulcal prominence. No abnormal parenchymal signal, mass lesions, mass effect. No reduced diffusion to suggest acute ischemia nor  hyperacute demyelination. No susceptibility artifact to suggest hemorrhage. Solitary subcentimeter white matter T2 hyperintensity in LEFT external capsule.  No abnormal extra-axial fluid collections. Mega cisterna magna. No extra-axial masses though, contrast enhanced sequences would be more sensitive. Normal major intracranial vascular flow voids seen at the skull base.  Ocular globes and orbital contents are unremarkable though not tailored for evaluation. No abnormal sellar expansion. Visualized paranasal sinuses and mastoid air cells are well-aerated. No suspicious calvarial bone marrow signal. No abnormal sellar expansion. Craniocervical junction maintained.  IMPRESSION: No acute intracranial process or MR findings of demyelination ; single subcentimeter nonspecific white matter lesion, atypical in appearance for demyelination.  Mild parenchymal brain volume loss for age, may be transient in the setting of dehydration, malnutrition or steroid use.   Electronically Signed   By: Awilda Metroourtnay  Bloomer   On: 04/24/2015 22:50     EKG Interpretation None      MDM   Final diagnoses:  Visual changes  Left eye pain    Pt with blurred vision in left eye, floater in left visual field, photosensetivity, pain. Eye exam unremarkable with no corneal abrasion.  Conjunctiva normal PERRLA. Bedside US performed by Dr. Juleen ChinaKohut normal.  Pt now states she has had some numbness in legs a year ago. Negative lumbar spine MR at that time. They mentioned to her something about MS, pt is concerned. Will get MRI brain.    11:21 PM MRI is negative. Patient is in no acute distress. Patient will be discharged home at this time, follow up with ophthalmology tomorrow. Will give referral, instructed to call for an appointment. Return if symptoms worsening. Ceasar Mons. Filed Vitals:   04/24/15 1929 04/24/15 2153 04/24/15 2326  BP: 138/94 119/72 118/83  Pulse: 107 72 74  Temp: 97.6 F (36.4 C) 99.3 F (37.4 C) 99.1 F (37.3 C)  TempSrc: Oral Oral Oral  Resp: 20 18 18   SpO2: 100% 100% 100%     Jaynie Crumbleatyana Aela Bohan, PA-C 04/24/15 2333  Raeford RazorStephen Kohut, MD 04/27/15 [phone]

## 2015-04-24 NOTE — ED Notes (Signed)
Pt returned from MRI °

## 2015-07-21 ENCOUNTER — Encounter (HOSPITAL_COMMUNITY): Payer: Self-pay | Admitting: *Deleted

## 2015-07-21 ENCOUNTER — Emergency Department (INDEPENDENT_AMBULATORY_CARE_PROVIDER_SITE_OTHER)
Admission: EM | Admit: 2015-07-21 | Discharge: 2015-07-21 | Disposition: A | Discharge status: Home / Self Care | Payer: 59 | Source: Home / Self Care | Attending: Family Medicine | Admitting: Family Medicine

## 2015-07-21 DIAGNOSIS — B373 Candidiasis of vulva and vagina: Secondary | ICD-10-CM

## 2015-07-21 DIAGNOSIS — N390 Urinary tract infection, site not specified: Secondary | ICD-10-CM

## 2015-07-21 DIAGNOSIS — B3731 Acute candidiasis of vulva and vagina: Secondary | ICD-10-CM

## 2015-07-21 LAB — POCT URINALYSIS DIP (DEVICE)
BILIRUBIN URINE: NEGATIVE
GLUCOSE, UA: NEGATIVE mg/dL
Ketones, ur: NEGATIVE mg/dL
LEUKOCYTES UA: NEGATIVE
NITRITE: NEGATIVE
PH: 7 (ref 5.0–8.0)
Protein, ur: NEGATIVE mg/dL
SPECIFIC GRAVITY, URINE: 1.01 (ref 1.005–1.030)
Urobilinogen, UA: 0.2 mg/dL (ref 0.0–1.0)

## 2015-07-21 LAB — POCT PREGNANCY, URINE: Preg Test, Ur: NEGATIVE

## 2015-07-21 MED ORDER — FLUCONAZOLE 150 MG PO TABS: 150.0000 mg | ORAL_TABLET | Freq: Once | ORAL | Status: DC

## 2015-07-21 MED ORDER — CEPHALEXIN 500 MG PO CAPS: 500.0000 mg | ORAL_CAPSULE | Freq: Four times a day (QID) | ORAL | Status: DC

## 2015-07-21 MED ORDER — TERCONAZOLE 80 MG VA SUPP: 80.0000 mg | Freq: Every day | VAGINAL | Status: DC

## 2015-07-21 NOTE — ED Provider Notes (Signed)
CSN: [id number]     Arrival date & time 07/21/15  1641 History   First MD Initiated Contact with Patient 07/21/15 1755     Chief Complaint  Patient presents with  . Urinary Tract Infection   (Consider location/radiation/quality/duration/timing/severity/associated sxs/prior Treatment) Patient is a 34 y.o. female presenting with dysuria. The history is provided by the patient.  Dysuria Pain quality:  Burning Pain severity:  Mild Onset quality:  Gradual Duration:  3 days Chronicity:  Recurrent Recent urinary tract infections: yes   Relieved by:  None tried Worsened by:  Nothing tried Ineffective treatments:  None tried Urinary symptoms: frequent urination   Associated symptoms: fever and vaginal discharge   Associated symptoms: no flank pain, no nausea and no vomiting   Risk factors: recurrent urinary tract infections     Past Medical History  Diagnosis Date  . Gall stones   . Mastoiditis   . Ovarian cyst   . Pancreatitis   . Jaundice   . Dysrhythmia   . Anxiety   . Headache   . Psoriasis   . Hypermobility of joint     Ehler Danlos Syndrome  . Infection     bladder  . Anorexia   . Migraine   . Cirrhosis   . Morton neuroma   . Scoliosis    Past Surgical History  Procedure Laterality Date  . Dilation and curettage of uterus  2002  . Laparoscopic cholecystectomy  2006   Family History  Problem Relation Age of Onset  . Heart disease Father   . Cancer Father     skin  . Osteoarthritis Father    Social History  Substance Use Topics  . Smoking status: Current Every Day Smoker -- 1.00 packs/day    Types: Cigarettes  . Smokeless tobacco: Never Used  . Alcohol Use: Yes     Comment: once monthly   OB History    Gravida Para Term Preterm AB TAB SAB Ectopic Multiple Living   Review of Systems  Constitutional: Positive for fever.  HENT: Positive for congestion, postnasal drip and sore throat.   Gastrointestinal: Negative for nausea and  vomiting.  Genitourinary: Positive for dysuria, urgency and vaginal discharge. Negative for flank pain.    Allergies  Sulfa antibiotics and Morphine and related  Home Medications   Prior to Admission medications   Medication Sig Start Date End Date Taking? Authorizing Provider  B Complex Vitamins SOLN Take 10 mLs by mouth daily.     Historical Provider, MD  cephALEXin (KEFLEX) 500 MG capsule Take 1 capsule (500 mg total) by mouth 4 (four) times daily. Take all of medicine and drink lots of fluids 07/21/15   Linna Hoff, MD  cetirizine (ZYRTEC) 10 MG tablet Take 10 mg by mouth daily.    Historical Provider, MD  citalopram (CELEXA) 10 MG tablet Take 1 tablet (10 mg total) by mouth daily. Patient not taking: Reported on 02/01/2015 01/12/15   Reuben Likes, MD  citalopram (CELEXA) 20 MG tablet Take 20 mg by mouth at bedtime.     Historical Provider, MD  clonazePAM (KLONOPIN) 1 MG tablet Take 1 mg by mouth at bedtime.     Historical Provider, MD  fluconazole (DIFLUCAN) 150 MG tablet Take 1 tablet (150 mg total) by mouth once. Repeat in 1 week 07/21/15   Linna Hoff, MD  HOODIA GORDONII PO Take 2 tablets by mouth 3 (three)  times daily.    Historical Provider, MD  HYDROcodone-acetaminophen (NORCO/VICODIN) 5-325 MG per tablet Take 1 tablet by mouth every 6 (six) hours as needed for moderate pain. Patient not taking: Reported on 04/24/2015 02/01/15   Bethann Berkshire, MD  ibuprofen (ADVIL,MOTRIN) 600 MG tablet Take 1 tablet (600 mg total) by mouth every 6 (six) hours as needed. Patient not taking: Reported on 04/24/2015 12/13/14   Marlis Edelson, CNM  levofloxacin (LEVAQUIN) 500 MG tablet Take 1 tablet (500 mg total) by mouth daily. Patient not taking: Reported on 04/24/2015 02/01/15   Bethann Berkshire, MD  Melatonin 10 MG CAPS Take 10 mg by mouth daily.    Historical Provider, MD  metroNIDAZOLE (FLAGYL) 500 MG tablet Take 1 tablet (500 mg total) by mouth 2 (two) times daily. X 7 days Patient not taking:  Reported on 02/01/2015 01/19/15   Hayden Rasmussen, NP  nitrofurantoin, macrocrystal-monohydrate, (MACROBID) 100 MG capsule Take 1 capsule (100 mg total) by mouth 2 (two) times daily. Patient not taking: Reported on 04/24/2015 01/31/15   Mathis Fare Presson, PA  terconazole (TERAZOL 3) 80 MG vaginal suppository Place 1 suppository (80 mg total) vaginally at bedtime. 07/21/15   Linna Hoff, MD   BP 113/80 mmHg  Pulse 68  Temp(Src) 99.1 F (37.3 C) (Oral)  Resp 20  SpO2 99%  LMP 07/21/2015 (LMP Unknown) Physical Exam  Constitutional: She is oriented to person, place, and time. She appears well-developed and well-nourished. No distress.  HENT:  Right Ear: External ear normal.  Left Ear: External ear normal.  Mouth/Throat: Oropharynx is clear and moist.  Neck: Normal range of motion. Neck supple.  Pulmonary/Chest: Effort normal and breath sounds normal.  Abdominal: Soft. Bowel sounds are normal. There is no tenderness.  Lymphadenopathy:    She has no cervical adenopathy.  Neurological: She is alert and oriented to person, place, and time.  Skin: Skin is warm and dry.  Nursing note and vitals reviewed.   ED Course  Procedures (including critical care time) Labs Review Labs Reviewed  POCT URINALYSIS DIP (DEVICE) - Abnormal; Notable for the following:    Hgb urine dipstick SMALL (*)    All other components within normal limits  POCT PREGNANCY, URINE    Imaging Review No results found.   MDM   1. UTI (lower urinary tract infection)   2. Candida vaginitis        Linna Hoff, MD 07/21/15 [phone]

## 2015-07-21 NOTE — Discharge Instructions (Signed)
Take all of medicine as directed, drink lots of fluids, see your doctor if further problems. °

## 2015-07-21 NOTE — ED Notes (Signed)
Pt  Reports      Burning  On urination   With        Frequency      And  Fever      With   Symptoms     X    sev  Days

## 2015-08-31 ENCOUNTER — Other Ambulatory Visit: Payer: Self-pay | Admitting: Obstetrics and Gynecology

## 2015-09-01 LAB — CYTOLOGY - PAP

## 2015-11-11 ENCOUNTER — Other Ambulatory Visit (HOSPITAL_COMMUNITY): Payer: 59

## 2015-11-11 ENCOUNTER — Emergency Department (HOSPITAL_COMMUNITY)
Admission: EM | Admit: 2015-11-11 | Discharge: 2015-11-11 | Disposition: A | Discharge status: Home / Self Care | Payer: 59 | Attending: Emergency Medicine | Admitting: Emergency Medicine

## 2015-11-11 ENCOUNTER — Emergency Department (HOSPITAL_COMMUNITY): Payer: 59

## 2015-11-11 ENCOUNTER — Encounter (HOSPITAL_COMMUNITY): Payer: Self-pay | Admitting: Emergency Medicine

## 2015-11-11 DIAGNOSIS — F17210 Nicotine dependence, cigarettes, uncomplicated: Secondary | ICD-10-CM | POA: Diagnosis not present

## 2015-11-11 DIAGNOSIS — F419 Anxiety disorder, unspecified: Secondary | ICD-10-CM | POA: Insufficient documentation

## 2015-11-11 DIAGNOSIS — Z872 Personal history of diseases of the skin and subcutaneous tissue: Secondary | ICD-10-CM | POA: Diagnosis not present

## 2015-11-11 DIAGNOSIS — Z79899 Other long term (current) drug therapy: Secondary | ICD-10-CM | POA: Diagnosis not present

## 2015-11-11 DIAGNOSIS — Z8742 Personal history of other diseases of the female genital tract: Secondary | ICD-10-CM | POA: Insufficient documentation

## 2015-11-11 DIAGNOSIS — M419 Scoliosis, unspecified: Secondary | ICD-10-CM | POA: Insufficient documentation

## 2015-11-11 DIAGNOSIS — K529 Noninfective gastroenteritis and colitis, unspecified: Secondary | ICD-10-CM | POA: Diagnosis not present

## 2015-11-11 DIAGNOSIS — G43801 Other migraine, not intractable, with status migrainosus: Secondary | ICD-10-CM | POA: Diagnosis not present

## 2015-11-11 DIAGNOSIS — Z792 Long term (current) use of antibiotics: Secondary | ICD-10-CM | POA: Insufficient documentation

## 2015-11-11 DIAGNOSIS — Z8669 Personal history of other diseases of the nervous system and sense organs: Secondary | ICD-10-CM | POA: Diagnosis not present

## 2015-11-11 DIAGNOSIS — R109 Unspecified abdominal pain: Secondary | ICD-10-CM | POA: Diagnosis present

## 2015-11-11 LAB — CBC WITH DIFFERENTIAL/PLATELET
BASOS PCT: 0 %
Basophils Absolute: 0 10*3/uL (ref 0.0–0.1)
EOS ABS: 0.1 10*3/uL (ref 0.0–0.7)
Eosinophils Relative: 2 %
HEMATOCRIT: 41.3 % (ref 36.0–46.0)
HEMOGLOBIN: 13.8 g/dL (ref 12.0–15.0)
LYMPHS ABS: 1.1 10*3/uL (ref 0.7–4.0)
Lymphocytes Relative: 16 %
MCH: 30.3 pg (ref 26.0–34.0)
MCHC: 33.4 g/dL (ref 30.0–36.0)
MCV: 90.8 fL (ref 78.0–100.0)
MONO ABS: 0.4 10*3/uL (ref 0.1–1.0)
Monocytes Relative: 6 %
NEUTROS ABS: 5.2 10*3/uL (ref 1.7–7.7)
NEUTROS PCT: 76 %
Platelets: 233 10*3/uL (ref 150–400)
RBC: 4.55 MIL/uL (ref 3.87–5.11)
RDW: 13.3 % (ref 11.5–15.5)
WBC: 6.8 10*3/uL (ref 4.0–10.5)

## 2015-11-11 LAB — COMPREHENSIVE METABOLIC PANEL
ALBUMIN: 3.8 g/dL (ref 3.5–5.0)
ALK PHOS: 52 U/L (ref 38–126)
ALT: 13 U/L — ABNORMAL LOW (ref 14–54)
AST: 19 U/L (ref 15–41)
Anion gap: 8 (ref 5–15)
BILIRUBIN TOTAL: 1 mg/dL (ref 0.3–1.2)
BUN: 8 mg/dL (ref 6–20)
CALCIUM: 9 mg/dL (ref 8.9–10.3)
CO2: 27 mmol/L (ref 22–32)
Chloride: 105 mmol/L (ref 101–111)
Creatinine, Ser: 0.68 mg/dL (ref 0.44–1.00)
GFR calc Af Amer: >60 mL/min (ref >60)
GFR calc non Af Amer: >60 mL/min (ref >60)
Glucose, Bld: 140 mg/dL — ABNORMAL HIGH (ref 65–99)
Potassium: 3.5 mmol/L (ref 3.5–5.1)
SODIUM: 140 mmol/L (ref 135–145)
TOTAL PROTEIN: 6.9 g/dL (ref 6.5–8.1)

## 2015-11-11 LAB — LIPASE, BLOOD: Lipase: 22 U/L (ref 11–51)

## 2015-11-11 MED ORDER — SODIUM CHLORIDE 0.9 % IV BOLUS (SEPSIS)
1000.0000 mL | Freq: Once | INTRAVENOUS | Status: AC
  Administered 2015-11-11: 1000 mL via INTRAVENOUS

## 2015-11-11 MED ORDER — DIPHENHYDRAMINE HCL 50 MG/ML IJ SOLN
25.0000 mg | Freq: Once | INTRAMUSCULAR | Status: AC
  Administered 2015-11-11: 25 mg via INTRAVENOUS
  Filled 2015-11-11: qty 1

## 2015-11-11 MED ORDER — ONDANSETRON HCL 4 MG/2ML IJ SOLN
4.0000 mg | Freq: Once | INTRAMUSCULAR | Status: AC
  Administered 2015-11-11: 4 mg via INTRAVENOUS
  Filled 2015-11-11: qty 2

## 2015-11-11 MED ORDER — METOCLOPRAMIDE HCL 5 MG/ML IJ SOLN
10.0000 mg | Freq: Once | INTRAMUSCULAR | Status: AC
  Administered 2015-11-11: 10 mg via INTRAVENOUS
  Filled 2015-11-11: qty 2

## 2015-11-11 MED ORDER — SODIUM CHLORIDE 0.9 % IV SOLN: INTRAVENOUS | Status: DC

## 2015-11-11 MED ORDER — KETOROLAC TROMETHAMINE 30 MG/ML IJ SOLN
30.0000 mg | Freq: Once | INTRAMUSCULAR | Status: AC
  Administered 2015-11-11: 30 mg via INTRAVENOUS
  Filled 2015-11-11: qty 1

## 2015-11-11 MED ORDER — METOCLOPRAMIDE HCL 10 MG PO TABS: 10.0000 mg | ORAL_TABLET | Freq: Four times a day (QID) | ORAL | Status: DC

## 2015-11-11 MED ORDER — DIPHENHYDRAMINE HCL 25 MG PO TABS: 25.0000 mg | ORAL_TABLET | Freq: Four times a day (QID) | ORAL | Status: DC

## 2015-11-11 MED ORDER — IBUPROFEN 800 MG PO TABS: 800.0000 mg | ORAL_TABLET | Freq: Three times a day (TID) | ORAL | Status: DC

## 2015-11-11 NOTE — Discharge Instructions (Signed)
Migraine Headache A migraine headache is an intense, throbbing pain on one or both sides of your head. A migraine can last for 30 minutes to several hours. CAUSES  The exact cause of a migraine headache is not always known. However, a migraine may be caused when nerves in the brain become irritated and release chemicals that cause inflammation. This causes pain. Certain things may also trigger migraines, such as:  Alcohol.  Smoking.  Stress.  Menstruation.  Aged cheeses.  Foods or drinks that contain nitrates, glutamate, aspartame, or tyramine.  Lack of sleep.  Chocolate.  Caffeine.  Hunger.  Physical exertion.  Fatigue.  Medicines used to treat chest pain (nitroglycerine), birth control pills, estrogen, and some blood pressure medicines. SIGNS AND SYMPTOMS  Pain on one or both sides of your head.  Pulsating or throbbing pain.  Severe pain that prevents daily activities.  Pain that is aggravated by any physical activity.  Nausea, vomiting, or both.  Dizziness.  Pain with exposure to bright lights, loud noises, or activity.  General sensitivity to bright lights, loud noises, or smells. Before you get a migraine, you may get warning signs that a migraine is coming (aura). An aura may include:  Seeing flashing lights.  Seeing bright spots, halos, or zigzag lines.  Having tunnel vision or blurred vision.  Having feelings of numbness or tingling.  Having trouble talking.  Having muscle weakness. DIAGNOSIS  A migraine headache is often diagnosed based on:  Symptoms.  Physical exam.  A CT scan or MRI of your head. These imaging tests cannot diagnose migraines, but they can help rule out other causes of headaches. TREATMENT Medicines may be given for pain and nausea. Medicines can also be given to help prevent recurrent migraines.  HOME CARE INSTRUCTIONS  Only take over-the-counter or prescription medicines for pain or discomfort as directed by your  health care provider. The use of long-term narcotics is not recommended.  Lie down in a dark, quiet room when you have a migraine.  Keep a journal to find out what may trigger your migraine headaches. For example, write down:  What you eat and drink.  How much sleep you get.  Any change to your diet or medicines.  Limit alcohol consumption.  Quit smoking if you smoke.  Get 7-9 hours of sleep, or as recommended by your health care provider.  Limit stress.  Keep lights dim if bright lights bother you and make your migraines worse. SEEK IMMEDIATE MEDICAL CARE IF:   Your migraine becomes severe.  You have a fever.  You have a stiff neck.  You have vision loss.  You have muscular weakness or loss of muscle control.  You start losing your balance or have trouble walking.  You feel faint or pass out.  You have severe symptoms that are different from your first symptoms. MAKE SURE YOU:   Understand these instructions.  Will watch your condition.  Will get help right away if you are not doing well or get worse.   This information is not intended to replace advice given to you by your health care provider. Make sure you discuss any questions you have with your health care provider.   Document Released: 11/26/2005 Document Revised: 12/17/2014 Document Reviewed: 08/03/2013 Elsevier Interactive Patient Education 2016 Elsevier Inc. Viral Gastroenteritis Viral gastroenteritis is also known as stomach flu. This condition affects the stomach and intestinal tract. It can cause sudden diarrhea and vomiting. The illness typically lasts 3 to 8 days. Most people  develop an immune response that eventually gets rid of the virus. While this natural response develops, the virus can make you quite ill. CAUSES  Many different viruses can cause gastroenteritis, such as rotavirus or noroviruses. You can catch one of these viruses by consuming contaminated food or water. You may also catch a  virus by sharing utensils or other personal items with an infected person or by touching a contaminated surface. SYMPTOMS  The most common symptoms are diarrhea and vomiting. These problems can cause a severe loss of body fluids (dehydration) and a body salt (electrolyte) imbalance. Other symptoms may include:  Fever.  Headache.  Fatigue.  Abdominal pain. DIAGNOSIS  Your caregiver can usually diagnose viral gastroenteritis based on your symptoms and a physical exam. A stool sample may also be taken to test for the presence of viruses or other infections. TREATMENT  This illness typically goes away on its own. Treatments are aimed at rehydration. The most serious cases of viral gastroenteritis involve vomiting so severely that you are not able to keep fluids down. In these cases, fluids must be given through an intravenous line (IV). HOME CARE INSTRUCTIONS   Drink enough fluids to keep your urine clear or pale yellow. Drink small amounts of fluids frequently and increase the amounts as tolerated.  Ask your caregiver for specific rehydration instructions.  Avoid:  Foods high in sugar.  Alcohol.  Carbonated drinks.  Tobacco.  Juice.  Caffeine drinks.  Extremely hot or cold fluids.  Fatty, greasy foods.  Too much intake of anything at one time.  Dairy products until 24 to 48 hours after diarrhea stops.  You may consume probiotics. Probiotics are active cultures of beneficial bacteria. They may lessen the amount and number of diarrheal stools in adults. Probiotics can be found in yogurt with active cultures and in supplements.  Wash your hands well to avoid spreading the virus.  Only take over-the-counter or prescription medicines for pain, discomfort, or fever as directed by your caregiver. Do not give aspirin to children. Antidiarrheal medicines are not recommended.  Ask your caregiver if you should continue to take your regular prescribed and over-the-counter  medicines.  Keep all follow-up appointments as directed by your caregiver. SEEK IMMEDIATE MEDICAL CARE IF:   You are unable to keep fluids down.  You do not urinate at least once every 6 to 8 hours.  You develop shortness of breath.  You notice blood in your stool or vomit. This may look like coffee grounds.  You have abdominal pain that increases or is concentrated in one small area (localized).  You have persistent vomiting or diarrhea.  You have a fever.  The patient is a child younger than 3 months, and he or she has a fever.  The patient is a child older than 3 months, and he or she has a fever and persistent symptoms.  The patient is a child older than 3 months, and he or she has a fever and symptoms suddenly get worse.  The patient is a baby, and he or she has no tears when crying. MAKE SURE YOU:   Understand these instructions.  Will watch your condition.  Will get help right away if you are not doing well or get worse.   This information is not intended to replace advice given to you by your health care provider. Make sure you discuss any questions you have with your health care provider.   Document Released: 11/26/2005 Document Revised: 02/18/2012 Document Reviewed: 09/12/2011 Elsevier  Interactive Patient Education [employer]  Emergency Department Resource Guide 1) Find a Doctor and Pay Out of Pocket Although you won't have to find out who is covered by your insurance plan, it is a good idea to ask around and get recommendations. You will then need to call the office and see if the doctor you have chosen will accept you as a new patient and what types of options they offer for patients who are self-pay. Some doctors offer discounts or will set up payment plans for their patients who do not have insurance, but you will need to ask so you aren't surprised when you get to your appointment.  2) Contact Your Local Health Department Not all health departments  have doctors that can see patients for sick visits, but many do, so it is worth a call to see if yours does. If you don't know where your local health department is, you can check in your phone book. The CDC also has a tool to help you locate your state's health department, and many state websites also have listings of all of their local health departments.  3) Find a Walk-in Clinic If your illness is not likely to be very severe or complicated, you may want to try a walk in clinic. These are popping up all over the country in pharmacies, drugstores, and shopping centers. They're usually staffed by nurse practitioners or physician assistants that have been trained to treat common illnesses and complaints. They're usually fairly quick and inexpensive. However, if you have serious medical issues or chronic medical problems, these are probably not your best option.  No Primary Care Doctor: - Call Health Connect at  [phone] - they can help you locate a primary care doctor that  accepts your insurance, provides certain services, etc. - Physician Referral Service- [phone]  Chronic Pain Problems: Organization         Address  Phone   Notes  [facility]  [phone] Patients need to be referred by their primary care doctor.   Medication Assistance: Organization         Address  Phone   Notes  [facility] Medication [facility] [address] [place], [place] [address] [phone] --Must be a resident of [facility] -- Must have NO insurance coverage whatsoever (no Medicaid/ Medicare, etc.) -- The pt. MUST have a primary care doctor that directs their care regularly and follows them in the community   [facility]ist  [phone]   [employer]  [phone]    Agencies that provide inexpensive medical care: Organization         Address  Phone   Notes  [facility]  [phone]   [facility]    [phone]    [facility] [address] [place], [place] [address] [phone]   [facility] [address] [place], [place] [phone]   Planned Parenthood    [phone]   [facility]    [phone]   [facility]  [address], [place] Phone:  [phone], Fax:  [phone] Hours of Operation:  9 am - 6 pm, M-F.  Also accepts Medicaid/Medicare and self-pay.  [facility]  [address], [place] Phone: [phone], Fax: [phone]. Hours of Operation:  8:30 am - 5:30 pm, M-F.  Also accepts  Medicaid and self-pay.  [facility] [place] [address], [place] Phone: [phone]   [facility] [address] Natasha Bence [place], [place] [phone], Ext. [phone] Mondays & Thursdays: 7-9 AM.  First 15 patients are seen on a first come, first serve basis.    Medicaid-accepting [facility] Providers:  Organization         Address  Phone   Notes  [facility] [address], [place] [phone] Also accepts self-pay patients.  [facility] [address] Laurell Josephs [place], [place]  [phone]   [facility] [address], [place] [phone]   [facility] [address] [place] [phone]   Renaye Rakers [address], [place]   [phone] Only accepts [place] patients after they have their name applied to their card.   Self-Pay (no insurance) in [facility]:  Organization         Address  Phone   Notes  Sickle Cell Patients, [facility] [address] [place], [place] [phone]   [facility] [address] [place], [place] [phone]   [facility] [place]  [address], [place] [phone]   [facility]/Dr. Osei-Bonsu  [address], [place] or [address], [place] [phone] Phone number for both [place] and [place] locations is the same.  [facility] and [facility] [address] [place] [phone]   [facility] [address] [place] or [address] [phone]   [facility] [address] [place] [phone], phone; [phone], fax Sees patients 1st and 3rd Saturday of every month.  Must not qualify for public or private insurance (i.e. Medicaid, Medicare, [place] Health Choice, Veterans' Benefits)  Household income should be no more than 200% of the poverty level The clinic cannot treat you if you are pregnant or think you are pregnant  Sexually transmitted diseases are not treated at the clinic.    Dental Care: Organization         Address  Phone  Notes  [facility] [address] [place], [place] [phone] Accepts children up to age 88 who are enrolled in [place] or [place] Health Choice; pregnant women with a Medicaid card; and children who have applied for Medicaid or [place] Health Choice, but were declined, whose parents can pay a reduced fee at time of service.  [facility] Department of [facility]  [address], [place] [phone] Accepts children up to age 48 who are enrolled in [place] or [place] Health Choice; pregnant women with a Medicaid card; and children who have applied for Medicaid or [place] Health Choice, but were declined, whose parents can pay a reduced fee at time of service.  [facility]  [address] [place], [place] [phone] Patients are seen by appointment only. Walk-ins are not accepted. [facility] will see patients 66 years of age and older. Monday - Tuesday (8am-5pm) Most Wednesdays (8:30-5pm) $30 per visit, cash only  [facility]  Adult Dental Access PROGRAM  [address], [facility] [phone] Patients are seen by appointment only. Walk-ins are not accepted. [facility] will see  patients 35 years of age and older. One Wednesday Evening (Monthly: Volunteer Based).  $30 per visit, cash only  [employer] [facility]  [phone] for adults; Children under age 104, call Graduate Pediatric Dentistry at [phone]. Children aged 554-14, please call [phone] to request a pediatric application.  Dental services are provided in all areas of dental care including fillings, crowns and bridges, complete and partial dentures, implants, gum treatment, root canals, and extractions. Preventive care is also provided. Treatment is provided to both adults and children. Patients are selected via a lottery and there is often a waiting list.   [name] [address], [place]  [phone] [email or web address]   [facility] [address], [place], [place] [phone], Ext. [phone] Second and Fourth Thursday of each month, opens at 6:30 AM; Clinic ends at 9 AM.  Patients are seen on a first-come first-served basis, and a limited number are seen during each clinic.   [facility]  [address], [place], [place] [phone]   Eligibility Requirements You must have lived in [place], [place], or [place] counties for at least the last three months.   You cannot be eligible for state or federal sponsored [place] Cityhealthcare insurance, including CIGNAVeterans Administration, [place]icaid, or [employer]icare.   You generally cannot be eligible for healthcare insurance through your employer.    How to apply: Eligibility screenings are held every Tuesday and Wednesday afternoon from 1:00 pm until 4:00 pm. You do not need an appointment for the interview!  [facility] [address], [place], [place] [phone]   [facility]  [phone]   [facility]  [phone]   [facility]  [phone]    Behavioral Health Resources in the Community: Intensive Outpatient Programs Organization         Address  Phone  Notes  [facility] [address], [place], [place] [phone]   [facility] Outpatient [address], [place], [place] [phone]   [facility] [address], [place], [place]  [phone]   [facility] [address],  [place], [place] [phone] or [phone]   Substance Abuse Resources Organization         Address  Phone  Notes  [facility]  [phone]   [facility]  [phone]   [facility]  [phone]   [facility]  [phone]   Residential & Outpatient Substance Abuse Program  [phone]   Psychological Services Organization         Address  Phone  Notes  [facility]  [phone]- [phone]   [facility]  [phone]- [phone]   [facility] [address], [place] [phone] or [phone]    Mobile Crisis Teams Organization         Address  Phone  Notes  [facility], Mobile Crisis Care Unit  [phone]   [facility]  [address] [place], [place] [phone]   Doristine LocksSharon DeEsch [address] [place] [phone]    Self-Help/Support Groups Organization         Address  Phone             Notes  [facility] [place] - variety of support groups  [phone] Call for more information  Narcotics Anonymous (NA), [facility] [address], [place]  [place]  2 meetings at this location   Residential Treatment Programs Organization         Address  Phone  Notes  [facility] [address],    [place]  [phone]   [facility]  [address], [place] [phone], [place], [place] [phone]   [facility]  Facility [address], [place] [phone] Admissions: 8am-3pm M-F  [facility] [address]    [place], [place] [phone]   [facility] [address], [place], [place] [phone]   [facility] [address]  [place], [place] [phone]   [facility] - Intensive Outpatient [address], [place], [place] [phone]   [facility] ([facility] [address]  [place], [place] [phone] or [phone]   [facility] ([facility]) [address] [place], [place] [phone] Accepts Medicaid  [facility] [address]  [place] [phone] Substance Abuse/Addiction Treatment   [facility] Resources Organization         Address  Phone  Notes  [facility]  [phone]   Angie FavaJulie Brannon, PhD [address], [address] [place], [place]   [phone] or [phone]   [facility]   [address] [place], [place] [phone]   [facility] [address], [place], [place] [phone] Insurance/Medicaid/sponsorship through [facility]  [facility] [address]                                    [place], [place] [phone] Therapy/tele-psych/case  [facility] [address]   [place], [place] [phone]) [phone]    Dr. Lolly MustacheArfeen  [phone]   [facility]  [employer] [facility] 1) [address], [place] 2) [address], [place] 3)  [address], [place] [phone]  [phone]   [facility] Child Abuse Hotline [phone] or [phone] (After Hours)

## 2015-11-11 NOTE — ED Notes (Signed)
MD at bedside. 

## 2015-11-11 NOTE — ED Notes (Signed)
Bed: [id number] Expected date:  Expected time:  Means of arrival:  Comments: EMS 34yo F migraine - ibuprofen with no relief

## 2015-11-11 NOTE — ED Notes (Addendum)
Pt BIBA, reports that she awoke with abdominal pain n/v/d that caused a migraine, took Ibuprofen without relief. States that this migraine is worse than previous migraines. Pt remains nauseated.

## 2015-11-11 NOTE — ED Notes (Signed)
Report given to Rachel, RN.

## 2015-11-11 NOTE — ED Provider Notes (Signed)
CSN: [id number]     Arrival date & time 11/11/15  [id number] History   First MD Initiated Contact with Patient 11/11/15 [phone]     Chief Complaint  Patient presents with  . Migraine  . Abdominal Pain     (Consider location/radiation/quality/duration/timing/severity/associated sxs/prior Treatment) HPI Awakened at 3 AM with vomiting and diarrhea. Reports 3 episodes of diarrhea and 3 episodes of vomiting. Onset of severe left-sided headache at about the same time. Sharp and aching in quality. No loss of consciousness. No focal weakness. Patient states she did feel generally weak. She reports history of migraines but typically not as severe and typically she does not the hospital for treatment. 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 Family history positive for mother with migraine. No known  aneurysm family history by the patient. Past Medical History  Diagnosis Date  . Gall stones   . Mastoiditis   . Ovarian cyst   . Pancreatitis   . Jaundice   . Dysrhythmia   . Anxiety   . Headache   . Psoriasis   . Hypermobility of joint     Ehler Danlos Syndrome  . Infection     bladder  . Anorexia   . Migraine   . Cirrhosis (HCC)   . Morton neuroma   . Scoliosis    Past Surgical History  Procedure Laterality Date  . Dilation and curettage of uterus  2002  . Laparoscopic cholecystectomy  2006   Family History  Problem Relation Age of Onset  . Heart disease Father   . Cancer Father     skin  . Osteoarthritis Father    Social History  Substance Use Topics  . Smoking status: Current Every Day Smoker -- 1.00 packs/day    Types: Cigarettes  . Smokeless tobacco: Never Used  . Alcohol Use: Yes     Comment: once monthly   OB History    Gravida Para Term Preterm AB TAB SAB Ectopic Multiple Living   1    1  1         Review of Systems 10 Systems reviewed and are negative for acute change except as noted in the HPI.    Allergies  Sulfa antibiotics and Morphine and related  Home  Medications   Prior to Admission medications   Medication Sig Start Date End Date Taking? Authorizing Provider  cetirizine (ZYRTEC) 10 MG tablet Take 10 mg by mouth daily.   Yes Historical Provider, MD  citalopram (CELEXA) 20 MG tablet Take 20 mg by mouth daily.   Yes Historical Provider, MD  clonazePAM (KLONOPIN) 1 MG tablet Take 1 mg by mouth 2 (two) times daily as needed for anxiety.    Yes Historical Provider, MD  ibuprofen (ADVIL,MOTRIN) 200 MG tablet Take 200 mg by mouth every 6 (six) hours as needed for headache or moderate pain.   Yes Historical Provider, MD  Melatonin 10 MG CAPS Take 10 mg by mouth daily.   Yes Historical Provider, MD  cephALEXin (KEFLEX) 500 MG capsule Take 1 capsule (500 mg total) by mouth 4 (four) times daily. Take all of medicine and drink lots of fluids Patient not taking: Reported on 11/11/2015 07/21/15   Linna HoffJames D Kindl, MD  citalopram (CELEXA) 10 MG tablet Take 1 tablet (10 mg total) by mouth daily. Patient not taking: Reported on 11/11/2015 01/12/15   Reuben Likesavid C Keller, MD  diphenhydrAMINE (BENADRYL) 25 MG tablet Take 1 tablet (25 mg total) by mouth every 6 (six) hours. 11/11/15   Lebron ConnersMarcy  Deaven Urwin, MD  fluconazole (DIFLUCAN) 150 MG tablet Take 1 tablet (150 mg total) by mouth once. Repeat in 1 week Patient not taking: Reported on 11/11/2015 07/21/15   Linna Hoff, MD  ibuprofen (ADVIL,MOTRIN) 800 MG tablet Take 1 tablet (800 mg total) by mouth 3 (three) times daily. 11/11/15   Arby Barrette, MD  metoCLOPramide (REGLAN) 10 MG tablet Take 1 tablet (10 mg total) by mouth every 6 (six) hours. 11/11/15   Arby Barrette, MD  terconazole (TERAZOL 3) 80 MG vaginal suppository Place 1 suppository (80 mg total) vaginally at bedtime. Patient not taking: Reported on 11/11/2015 07/21/15   Linna Hoff, MD   BP 121/76 mmHg  Pulse 107  Temp(Src) 98.2 F (36.8 C) (Oral)  Resp 18  SpO2 95%  LMP  (Within Weeks) Physical Exam  Constitutional: She is oriented to person, place, and time.  She appears well-developed and well-nourished.  Patient appears to be in pain but is alert and nontoxic. No respiratory distress.  HENT:  Head: Normocephalic and atraumatic.  Right Ear: External ear normal.  Left Ear: External ear normal.  Nose: Nose normal.  Mouth/Throat: Oropharynx is clear and moist.  Bilateral TMs normal without erythema or bulging. Dentition is in good condition. Posterior or first widely patent. No facial swelling or trismus.  Eyes: EOM are normal. Pupils are equal, round, and reactive to light.  Neck: Neck supple.  No meningismus.  Cardiovascular: Normal rate, regular rhythm, normal heart sounds and intact distal pulses.   Pulmonary/Chest: Effort normal and breath sounds normal.  Abdominal: Soft. Bowel sounds are normal. She exhibits no distension. There is no tenderness.  Musculoskeletal: Normal range of motion. She exhibits no edema.  Neurological: She is alert and oriented to person, place, and time. She has normal strength. No cranial nerve deficit. She exhibits normal muscle tone. Coordination normal. GCS eye subscore is 4. GCS verbal subscore is 5. GCS motor subscore is 6.  Skin: Skin is warm, dry and intact.  Psychiatric: She has a normal mood and affect.    ED Course  Procedures (including critical care time) Labs Review Labs Reviewed  COMPREHENSIVE METABOLIC PANEL - Abnormal; Notable for the following:    Glucose, Bld 140 (*)    ALT 13 (*)    All other components within normal limits  LIPASE, BLOOD  CBC WITH DIFFERENTIAL/PLATELET    Imaging Review Ct Head Wo Contrast  11/11/2015  CLINICAL DATA:  Headaches for approximately 1 day, more severe than usual EXAM: CT HEAD WITHOUT CONTRAST TECHNIQUE: Contiguous axial images were obtained from the base of the skull through the vertex without intravenous contrast. COMPARISON:  Apr 24, 2015 FINDINGS: The ventricles are normal in size and configuration. Prominence of the cisterna magna is a stable anatomic  variant. There is no intracranial mass, hemorrhage, extra-axial fluid collection, or midline shift. Gray-white compartments are normal. No acute infarct evident. The bony calvarium appears intact. The mastoid air cells are clear. No intraorbital lesions are identified. IMPRESSION: Study within normal limits. Prominence of the cisterna magna is a stable anatomic variant. Electronically Signed   By: Bretta Bang III M.D.   On: 11/11/2015 08:32   I have personally reviewed and evaluated these images and lab results as part of my medical decision-making.   EKG Interpretation None     Recheck 09:22 patient reports improvement. She reports she still has some headache and nausea but is much better. Mental status is clear. MDM   Final diagnoses:  Other migraine  with status migrainosus, not intractable  Gastroenteritis   At this time it appears patient has gastroenteritis with secondary migraine. She had onset of vomiting and diarrheal illness and also headache. CT does not show evidence of an acute bleed. Her neurologic examination is normal. Patient is improved with hydration and migraine therapy. Recommendations are for follow-up with the resource guide provided. Patient is counseled on signs and symptoms for which return.    Arby Barrette, MD 11/11/15 1016

## 2015-11-13 ENCOUNTER — Emergency Department (INDEPENDENT_AMBULATORY_CARE_PROVIDER_SITE_OTHER)
Admission: EM | Admit: 2015-11-13 | Discharge: 2015-11-13 | Disposition: A | Discharge status: Home / Self Care | Payer: 59 | Source: Home / Self Care

## 2015-11-13 ENCOUNTER — Encounter (HOSPITAL_COMMUNITY): Payer: Self-pay | Admitting: Emergency Medicine

## 2015-11-13 DIAGNOSIS — A499 Bacterial infection, unspecified: Secondary | ICD-10-CM

## 2015-11-13 DIAGNOSIS — N390 Urinary tract infection, site not specified: Secondary | ICD-10-CM | POA: Diagnosis not present

## 2015-11-13 DIAGNOSIS — N760 Acute vaginitis: Secondary | ICD-10-CM | POA: Diagnosis not present

## 2015-11-13 DIAGNOSIS — B9689 Other specified bacterial agents as the cause of diseases classified elsewhere: Secondary | ICD-10-CM

## 2015-11-13 LAB — POCT URINALYSIS DIP (DEVICE)
Bilirubin Urine: NEGATIVE
GLUCOSE, UA: NEGATIVE mg/dL
Ketones, ur: NEGATIVE mg/dL
Nitrite: NEGATIVE
PH: 6.5 (ref 5.0–8.0)
PROTEIN: NEGATIVE mg/dL
Specific Gravity, Urine: <=1.005 (ref 1.005–1.030)
UROBILINOGEN UA: 0.2 mg/dL (ref 0.0–1.0)

## 2015-11-13 LAB — POCT PREGNANCY, URINE: PREG TEST UR: NEGATIVE

## 2015-11-13 MED ORDER — ACETAMINOPHEN 325 MG PO TABS
975.0000 mg | ORAL_TABLET | Freq: Once | ORAL | Status: AC
  Administered 2015-11-13: 975 mg via ORAL

## 2015-11-13 MED ORDER — ACETAMINOPHEN 325 MG PO TABS
ORAL_TABLET | ORAL | Status: AC
  Filled 2015-11-13: qty 3

## 2015-11-13 MED ORDER — FLUCONAZOLE 200 MG PO TABS
200.0000 mg | ORAL_TABLET | Freq: Every day | ORAL | Status: AC
Start: 2015-11-13 — End: 2015-11-20

## 2015-11-13 MED ORDER — PHENAZOPYRIDINE HCL 200 MG PO TABS: 200.0000 mg | ORAL_TABLET | Freq: Three times a day (TID) | ORAL | Status: DC | PRN

## 2015-11-13 MED ORDER — CEPHALEXIN 500 MG PO CAPS: 500.0000 mg | ORAL_CAPSULE | Freq: Four times a day (QID) | ORAL | Status: DC

## 2015-11-13 MED ORDER — METRONIDAZOLE 0.75 % EX CREA: TOPICAL_CREAM | CUTANEOUS | Status: DC

## 2015-11-13 NOTE — Discharge Instructions (Signed)

## 2015-11-13 NOTE — ED Notes (Signed)
Pt here with c/o lower abdominal burning pain with slight vaginal d/c with odor that started 2 weeks ago States the pain has worsened today with knife like pain associated with nausea Denies fever,chills,vomitting

## 2015-11-13 NOTE — ED Provider Notes (Signed)
CSN: [id number]     Arrival date & time 11/13/15  1919 History   None    Chief Complaint  Patient presents with  . Urinary Tract Infection  . Vaginitis   (Consider location/radiation/quality/duration/timing/severity/associated sxs/prior Treatment) HPI Patient states that she has not been feeling well for the last several days. [facility] that were unrelated to her urinary tract symptoms now. She states that this time she has intense burning and pain with urination. She denies any fever any back pain at this time. There is been no home treatment. She was treated in August for UTI with cephalexin. She states that she has an allergy to Bactrim. Past Medical History  Diagnosis Date  . Gall stones   . Mastoiditis   . Ovarian cyst   . Pancreatitis   . Jaundice   . Dysrhythmia   . Anxiety   . Headache   . Psoriasis   . Hypermobility of joint     Ehler Danlos Syndrome  . Infection     bladder  . Anorexia   . Migraine   . Cirrhosis (HCC)   . Morton neuroma   . Scoliosis    Past Surgical History  Procedure Laterality Date  . Dilation and curettage of uterus  2002  . Laparoscopic cholecystectomy  2006   Family History  Problem Relation Age of Onset  . Heart disease Father   . Cancer Father     skin  . Osteoarthritis Father    Social History  Substance Use Topics  . Smoking status: Current Every Day Smoker -- 1.00 packs/day    Types: Cigarettes  . Smokeless tobacco: Never Used  . Alcohol Use: Yes     Comment: once monthly   OB History    Gravida Para Term Preterm AB TAB SAB Ectopic Multiple Living   1    1  1         Review of Systems  Constitutional: Negative.   HENT: Negative.   Respiratory: Negative.   Endocrine: Positive for polyphagia.  Genitourinary: Positive for dysuria, urgency and frequency. Negative for hematuria, flank pain and pelvic pain.  Musculoskeletal: Negative.     Allergies   Sulfa antibiotics and Morphine and related  Home Medications   Prior to Admission medications   Medication Sig Start Date End Date Taking? Authorizing Provider  cephALEXin (KEFLEX) 500 MG capsule Take 1 capsule (500 mg total) by mouth 4 (four) times daily. Take all of medicine and drink lots of fluids Patient not taking: Reported on 11/11/2015 07/21/15   Linna HoffJames D Kindl, MD  cetirizine (ZYRTEC) 10 MG tablet Take 10 mg by mouth daily.    Historical Provider, MD  citalopram (CELEXA) 10 MG tablet Take 1 tablet (10 mg total) by mouth daily. Patient not taking: Reported on 11/11/2015 01/12/15   Reuben Likesavid C Keller, MD  citalopram (CELEXA) 20 MG tablet Take 20 mg by mouth daily.    Historical Provider, MD  clonazePAM (KLONOPIN) 1 MG tablet Take 1 mg by mouth 2 (two) times daily as needed for anxiety.     Historical Provider, MD  diphenhydrAMINE (BENADRYL) 25 MG tablet Take 1 tablet (25 mg total) by mouth every 6 (six) hours. 11/11/15   Arby BarretteMarcy Pfeiffer, MD  fluconazole (DIFLUCAN) 150 MG tablet Take 1 tablet (150 mg total) by mouth once. Repeat in 1 week Patient not taking: Reported on 11/11/2015 07/21/15   Linna HoffJames D Kindl, MD  ibuprofen (  ADVIL,MOTRIN) 200 MG tablet Take 200 mg by mouth every 6 (six) hours as needed for headache or moderate pain.    Historical Provider, MD  ibuprofen (ADVIL,MOTRIN) 800 MG tablet Take 1 tablet (800 mg total) by mouth 3 (three) times daily. 11/11/15   Arby Barrette, MD  Melatonin 10 MG CAPS Take 10 mg by mouth daily.    Historical Provider, MD  metoCLOPramide (REGLAN) 10 MG tablet Take 1 tablet (10 mg total) by mouth every 6 (six) hours. 11/11/15   Arby Barrette, MD  terconazole (TERAZOL 3) 80 MG vaginal suppository Place 1 suppository (80 mg total) vaginally at bedtime. Patient not taking: Reported on 11/11/2015 07/21/15   Linna Hoff, MD   Meds Ordered and Administered this Visit  Medications - No data to display  BP 139/105 mmHg  Pulse 58  Temp(Src) 98.2 F (36.8 C) (Oral)   SpO2 96%  LMP 11/01/2015 No data found.   Physical Exam  Constitutional: She is oriented to person, place, and time. She appears well-developed and well-nourished.  HENT:  Head: Normocephalic and atraumatic.  Eyes: Conjunctivae are normal.  Neck: Normal range of motion. Neck supple.  Pulmonary/Chest: Effort normal.  Abdominal: Soft. She exhibits no distension. There is no tenderness. There is no rebound and no guarding.  No CVA tenderness.  Neurological: She is alert and oriented to person, place, and time.  Skin: Skin is warm and dry.  Psychiatric: She has a normal mood and affect. Her behavior is normal. Judgment and thought content normal.  Nursing note and vitals reviewed.   ED Course  Procedures (including critical care time)  Labs Review Labs Reviewed - No data to display  Imaging Review No results found.   Visual Acuity Review  Right Eye Distance:   Left Eye Distance:   Bilateral Distance:    Right Eye Near:   Left Eye Near:    Bilateral Near:         MDM   1. UTI (lower urinary tract infection)   2. BV (bacterial vaginosis)    Prescription for cephalexin 500 mg 3 times a day for 7 days. Also prescription for Pyridium 3 times a day for 3 days.. Patient also states that she has SYMPTOMS OF BV WHICH SHE HAS HAD A COUPLE DIFFERENT TIMES IN THE PAST AND FOREGO THE PELVIC EXAM AND JUST GET THE MEDICATION. PRESCRIPTION FOR METRONIDAZOLE AND DIFLUCAN ARE PROVIDED. Follow-up with her primary care provider. Return if there is any new or worsening of symptoms Patient is discharged by myself acknowledging understanding of instructions.    Tharon Aquas, PA 11/14/15 [phone]

## 2015-11-30 ENCOUNTER — Other Ambulatory Visit (HOSPITAL_COMMUNITY)
Admission: RE | Admit: 2015-11-30 | Discharge: 2015-11-30 | Disposition: A | Discharge status: Home / Self Care | Payer: 59 | Source: Ambulatory Visit | Attending: Emergency Medicine | Admitting: Emergency Medicine

## 2015-11-30 ENCOUNTER — Encounter (HOSPITAL_COMMUNITY): Payer: Self-pay | Admitting: Emergency Medicine

## 2015-11-30 ENCOUNTER — Emergency Department (INDEPENDENT_AMBULATORY_CARE_PROVIDER_SITE_OTHER)
Admission: EM | Admit: 2015-11-30 | Discharge: 2015-11-30 | Disposition: A | Discharge status: Home / Self Care | Payer: 59 | Source: Home / Self Care | Attending: Emergency Medicine | Admitting: Emergency Medicine

## 2015-11-30 DIAGNOSIS — N390 Urinary tract infection, site not specified: Secondary | ICD-10-CM | POA: Diagnosis not present

## 2015-11-30 LAB — POCT URINALYSIS DIP (DEVICE)
BILIRUBIN URINE: NEGATIVE
Glucose, UA: NEGATIVE mg/dL
Ketones, ur: NEGATIVE mg/dL
NITRITE: POSITIVE — AB
Protein, ur: NEGATIVE mg/dL
Specific Gravity, Urine: 1.01 (ref 1.005–1.030)
Urobilinogen, UA: 0.2 mg/dL (ref 0.0–1.0)
pH: 7 (ref 5.0–8.0)

## 2015-11-30 LAB — POCT PREGNANCY, URINE: PREG TEST UR: NEGATIVE

## 2015-11-30 MED ORDER — FLUCONAZOLE 150 MG PO TABS: ORAL_TABLET | ORAL | Status: DC

## 2015-11-30 MED ORDER — CIPROFLOXACIN HCL 500 MG PO TABS: 500.0000 mg | ORAL_TABLET | Freq: Two times a day (BID) | ORAL | Status: DC

## 2015-11-30 MED ORDER — PHENAZOPYRIDINE HCL 200 MG PO TABS: 200.0000 mg | ORAL_TABLET | Freq: Three times a day (TID) | ORAL | Status: DC | PRN

## 2015-11-30 NOTE — Discharge Instructions (Signed)
We are sending your urine off for culture to make sure that we have you on the right antibiotics. Give us a working phone number so that we can contact you if we need to change your antibiotics. Make sure you drink plenty of extra fluids. Return here if you get worse, or follow up with primary care physician of your choice.  This practice is taking new patients. They will see you even if you do not have insurance.  [facility] [address] [place], [place]  [id number] [phone]

## 2015-11-30 NOTE — ED Provider Notes (Signed)
HPI  SUBJECTIVE:  Elaine [name] is a 34 y.o. female who presents with dysuria, urinary urgency, frequency, cloudy, oderous urine for the past 2 days. She reports left lower back pain, low suprapubic achiness. Symptoms are worse with urination no alleviating factors. She tried Pyridium for this.  No nausea, vomiting, fever, chills. No hematuria.  No anorexia, other abdominal pain.  No vaginal bleeding or discharge/odor, vulvar itching, genital rash. Had a normal bowel movement yesterday. Patient is in a monoagmous sexual relationship with a female partner who is asxatic.  STDs are not a concern today. Patient has not taken antipyretic in the past 4-6 hours.  Pt is not pregnant- currently on menses. Patient has a past medical history frequent UTIs and she is been treated with Keflex successfully for the last 2 times. 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 She is status post cholecystectomy. No history of pyelonephritis, nephrolithiasis, diabetes, gonorrhea, chlamydia, herpes, HIV, syphilis, Trichomonas.  States this feels similar to previous UTIs. Pt is a smoker.  FH + nephrolithiasis.  Past Medical History  Diagnosis Date  . Gall stones   . Mastoiditis   . Ovarian cyst   . Pancreatitis   . Jaundice   . Dysrhythmia   . Anxiety   . Headache   . Psoriasis   . Hypermobility of joint     Ehler Danlos Syndrome  . Infection     bladder  . Anorexia   . Migraine   . Cirrhosis (HCC)   . Morton neuroma   . Scoliosis     Past Surgical History  Procedure Laterality Date  . Dilation and curettage of uterus  2002  . Laparoscopic cholecystectomy  2006    Family History  Problem Relation Age of Onset  . Heart disease Father   . Cancer Father     skin  . Osteoarthritis Father     Social History  Substance Use Topics  . Smoking status: Current Every Day Smoker -- 1.00 packs/day    Types: Cigarettes  . Smokeless tobacco: Never Used  . Alcohol Use: Yes     Comment: once monthly    No  current facility-administered medications for this encounter.  Current outpatient prescriptions:  .  cetirizine (ZYRTEC) 10 MG tablet, Take 10 mg by mouth daily., Disp: , Rfl:  .  ciprofloxacin (CIPRO) 500 MG tablet, Take 1 tablet (500 mg total) by mouth 2 (two) times daily. X 7 days, Disp: 14 tablet, Rfl: 0 .  citalopram (CELEXA) 20 MG tablet, Take 20 mg by mouth daily., Disp: , Rfl:  .  clonazePAM (KLONOPIN) 1 MG tablet, Take 1 mg by mouth 2 (two) times daily as needed for anxiety. , Disp: , Rfl:  .  diphenhydrAMINE (BENADRYL) 25 MG tablet, Take 1 tablet (25 mg total) by mouth every 6 (six) hours., Disp: 20 tablet, Rfl: 0 .  fluconazole (DIFLUCAN) 150 MG tablet, 1 tab po x 1. May repeat in 72 hours if no improvement, Disp: 2 tablet, Rfl: 0 .  ibuprofen (ADVIL,MOTRIN) 800 MG tablet, Take 1 tablet (800 mg total) by mouth 3 (three) times daily., Disp: 21 tablet, Rfl: 0 .  Melatonin 10 MG CAPS, Take 10 mg by mouth daily., Disp: , Rfl:  .  metoCLOPramide (REGLAN) 10 MG tablet, Take 1 tablet (10 mg total) by mouth every 6 (six) hours., Disp: 30 tablet, Rfl: 0 .  phenazopyridine (PYRIDIUM) 200 MG tablet, Take 1 tablet (200 mg total) by mouth 3 (three) times daily as  needed for pain., Disp: 6 tablet, Rfl: 0  Allergies  Allergen Reactions  . Sulfa Antibiotics Nausea Only  . Morphine And Related Nausea And Vomiting and Other (See Comments)    Chest pain     ROS  As noted in HPI.   Physical Exam  BP 135/92 mmHg  Pulse 82  Temp(Src) 98.6 F (37 C) (Oral)  Resp 18  SpO2 98%  LMP 11/01/2015  Constitutional: Well developed, well nourished, no acute distress Eyes:  EOMI, conjunctiva normal bilaterally HENT: Normocephalic, atraumatic,mucus membranes moist Respiratory: Normal inspiratory effort Cardiovascular: Normal rate GI: Normal appearance, soft, positive suprapubic tenderness, nondistended, normal bowel sounds. Negative Murphy, negative McBurney. No flank tenderness  Back: No CVA  tenderness  GU: Deferred per patient request  skin: No rash, skin intact Musculoskeletal: no deformities Neurologic: Alert & oriented x 3, no focal neuro deficits Psychiatric: Speech and behavior appropriate   ED Course   Medications - No data to display  Orders Placed This Encounter  Procedures  . Urine culture    Standing Status: Standing     Number of Occurrences: 1     Standing Expiration Date:     Order Specific Question:  List patient's active antibiotics    Answer:  recent tx keflex  . POCT urinalysis dip (device)    Standing Status: Standing     Number of Occurrences: 1     Standing Expiration Date:   . Pregnancy, urine POC    Standing Status: Standing     Number of Occurrences: 1     Standing Expiration Date:     No results found for this or any previous visit (from the past 24 hour(s)). No results found.  ED Clinical Impression  UTI (lower urinary tract infection)   ED Assessment/Plan  Reviewed labs, UA consistent with UTI, patient is not pregnant. Will send urine off for culture to confirm antibiotic choice. We'll treat as complicated UTI given recent treatment for UTI, and some flank tenderness and some CVA tenderness however vitals are normal she is afebrile he has not taken any antipyretic in past 4-6 hours, doubt pyelonephritis.  Patient declined pelvic, feel that she is low risk for STD given that she is in a monogamous relationship and that she had her partner have recently been tested for this. She was also recently treated for BV and yeast. She's had no history of Trichomonas. Deferring pelvic today per patient request. We'll treat empirically for UTI. If she does not get better advised patient to return and we will do a pelvic at that time.  also home with Diflucan as patient states that she gets a yeast infection every time she takes antibiotics.  Discussed labs, MDM, plan and followup with patient. Discussed sn/sx that should prompt return to the UC  or ED. Patient agrees with plan.   *This clinic note was created using Dragon dictation software. Therefore, there may be occasional mistakes despite careful proofreading.  ?   Domenick Gong, MD 12/02/15 1620

## 2015-11-30 NOTE — ED Notes (Signed)
Here with worsening UTI sx's burning, freq, urge and nausea s/p treatment 12/4 with Keflex Taking old script Pyridium, not working

## 2015-12-02 LAB — URINE CULTURE: Culture: 4000

## 2015-12-04 ENCOUNTER — Encounter (HOSPITAL_COMMUNITY): Payer: Self-pay | Admitting: Oncology

## 2015-12-04 ENCOUNTER — Emergency Department (HOSPITAL_COMMUNITY)
Admission: EM | Admit: 2015-12-04 | Discharge: 2015-12-05 | Disposition: A | Discharge status: Home / Self Care | Payer: 59 | Attending: Emergency Medicine | Admitting: Emergency Medicine

## 2015-12-04 DIAGNOSIS — N730 Acute parametritis and pelvic cellulitis: Secondary | ICD-10-CM

## 2015-12-04 DIAGNOSIS — Z8742 Personal history of other diseases of the female genital tract: Secondary | ICD-10-CM | POA: Insufficient documentation

## 2015-12-04 DIAGNOSIS — Z3202 Encounter for pregnancy test, result negative: Secondary | ICD-10-CM | POA: Diagnosis not present

## 2015-12-04 DIAGNOSIS — Z872 Personal history of diseases of the skin and subcutaneous tissue: Secondary | ICD-10-CM | POA: Diagnosis not present

## 2015-12-04 DIAGNOSIS — Z8679 Personal history of other diseases of the circulatory system: Secondary | ICD-10-CM | POA: Insufficient documentation

## 2015-12-04 DIAGNOSIS — Z8719 Personal history of other diseases of the digestive system: Secondary | ICD-10-CM | POA: Insufficient documentation

## 2015-12-04 DIAGNOSIS — R109 Unspecified abdominal pain: Secondary | ICD-10-CM | POA: Diagnosis present

## 2015-12-04 DIAGNOSIS — G43909 Migraine, unspecified, not intractable, without status migrainosus: Secondary | ICD-10-CM | POA: Insufficient documentation

## 2015-12-04 DIAGNOSIS — M419 Scoliosis, unspecified: Secondary | ICD-10-CM | POA: Diagnosis not present

## 2015-12-04 DIAGNOSIS — F419 Anxiety disorder, unspecified: Secondary | ICD-10-CM | POA: Diagnosis not present

## 2015-12-04 DIAGNOSIS — Z87448 Personal history of other diseases of urinary system: Secondary | ICD-10-CM | POA: Diagnosis not present

## 2015-12-04 DIAGNOSIS — F17210 Nicotine dependence, cigarettes, uncomplicated: Secondary | ICD-10-CM | POA: Insufficient documentation

## 2015-12-04 DIAGNOSIS — Z791 Long term (current) use of non-steroidal anti-inflammatories (NSAID): Secondary | ICD-10-CM | POA: Insufficient documentation

## 2015-12-04 DIAGNOSIS — Z79899 Other long term (current) drug therapy: Secondary | ICD-10-CM | POA: Diagnosis not present

## 2015-12-04 MED ORDER — ONDANSETRON HCL 4 MG/2ML IJ SOLN
4.0000 mg | Freq: Once | INTRAMUSCULAR | Status: AC
  Administered 2015-12-05: 4 mg via INTRAVENOUS
  Filled 2015-12-04: qty 2

## 2015-12-04 MED ORDER — KETOROLAC TROMETHAMINE 30 MG/ML IJ SOLN
30.0000 mg | Freq: Once | INTRAMUSCULAR | Status: AC
  Administered 2015-12-05: 30 mg via INTRAVENOUS
  Filled 2015-12-04: qty 1

## 2015-12-04 MED ORDER — SODIUM CHLORIDE 0.9 % IV BOLUS (SEPSIS)
1000.0000 mL | Freq: Once | INTRAVENOUS | Status: AC
  Administered 2015-12-05: 1000 mL via INTRAVENOUS

## 2015-12-04 NOTE — ED Notes (Signed)
Pt w/ hx of chronic UTI's.  Pt c/o b/l flank pain, nausea, burning w/ urination and fever/chills.  Pt is afebrile in triage.

## 2015-12-04 NOTE — ED Provider Notes (Signed)
By signing my name below, I, Ronney LionSuzanne Le, attest that this documentation has been prepared under the direction and in the presence of Oleta Gunnoe N Yamilette Garretson, DO. Electronically Signed: Ronney LionSuzanne Le, ED Scribe. 12/05/2015. 2:58 AM.   TIME SEEN: 11:53 PM   CHIEF COMPLAINT: Flank Pain  HPI:  Elaine [name] is a 34 y.o. female with a history of chronic UTIs, who presents to the Emergency Department complaining of sharp, shooting, 9/10 bilateral flank pain, left greater than right, that began 2 days ago. Associated symptoms include hot and cold flashes, as well as nausea. Bending at the waist exacerbates her pain. No injury to her back that she can recall. Patient states she last had a UTI on 12/5 that was treated with Keflex and then again on 12/21 which was treated with Cipro, both medications which have historically been successful for past infections. Urine culture from 12/21 showed insignificant growth. She states she is still taking Cipro but stopped taking pyridium because it upset her stomach. She also notes recent BV that was treated with Metrogel, as well as a yeast infection that is suspected to have resulted from the Keflex. She denies a history of kidney stones. She notes a surgical hystory of cholecystectomy and D&C.  She denies vomiting or diarrhea. She reports her LMP began on 11/30/2015. She is sectioned active with one partner and reports she has never had any history of STDs other than HPV seen on Pap smear and was recently negative for STDs during a screen.  ROS: See HPI Constitutional: no fever  Eyes: no drainage  ENT: no runny nose   Cardiovascular:  no chest pain  Resp: no SOB  GI: no vomiting GU: dysuria Integumentary: no rash  Allergy: no hives  Musculoskeletal: no leg swelling  Neurological: no slurred speech ROS otherwise negative  PAST MEDICAL HISTORY/PAST SURGICAL HISTORY:  Past Medical History  Diagnosis Date  . Gall stones   . Mastoiditis   . Ovarian cyst   . Pancreatitis    . Jaundice   . Dysrhythmia   . Anxiety   . Headache   . Psoriasis   . Hypermobility of joint     Ehler Danlos Syndrome  . Infection     bladder  . Anorexia   . Migraine   . Cirrhosis (HCC)   . Morton neuroma   . Scoliosis     MEDICATIONS:  Prior to Admission medications   Medication Sig Start Date End Date Taking? Authorizing Provider  cetirizine (ZYRTEC) 10 MG tablet Take 10 mg by mouth daily.    Historical Provider, MD  ciprofloxacin (CIPRO) 500 MG tablet Take 1 tablet (500 mg total) by mouth 2 (two) times daily. X 7 days 11/30/15   Domenick GongAshley Mortenson, MD  citalopram (CELEXA) 20 MG tablet Take 20 mg by mouth daily.    Historical Provider, MD  clonazePAM (KLONOPIN) 1 MG tablet Take 1 mg by mouth 2 (two) times daily as needed for anxiety.     Historical Provider, MD  diphenhydrAMINE (BENADRYL) 25 MG tablet Take 1 tablet (25 mg total) by mouth every 6 (six) hours. 11/11/15   Arby BarretteMarcy Pfeiffer, MD  fluconazole (DIFLUCAN) 150 MG tablet 1 tab po x 1. May repeat in 72 hours if no improvement 11/30/15   Domenick GongAshley Mortenson, MD  ibuprofen (ADVIL,MOTRIN) 800 MG tablet Take 1 tablet (800 mg total) by mouth 3 (three) times daily. 11/11/15   Arby BarretteMarcy Pfeiffer, MD  Melatonin 10 MG CAPS Take 10 mg by mouth daily.  Historical Provider, MD  metoCLOPramide (REGLAN) 10 MG tablet Take 1 tablet (10 mg total) by mouth every 6 (six) hours. 11/11/15   Arby Barrette, MD  phenazopyridine (PYRIDIUM) 200 MG tablet Take 1 tablet (200 mg total) by mouth 3 (three) times daily as needed for pain. 11/30/15   Domenick Gong, MD    ALLERGIES:  Allergies  Allergen Reactions  . Sulfa Antibiotics Nausea Only  . Morphine And Related Nausea And Vomiting and Other (See Comments)    Chest pain    SOCIAL HISTORY:  Social History  Substance Use Topics  . Smoking status: Current Every Day Smoker -- 1.00 packs/day    Types: Cigarettes  . Smokeless tobacco: Never Used  . Alcohol Use: Yes     Comment: once monthly     FAMILY HISTORY: Family History  Problem Relation Age of Onset  . Heart disease Father   . Cancer Father     skin  . Osteoarthritis Father     EXAM: BP 125/97 mmHg  Pulse 77  Temp(Src) 97.7 F (36.5 C) (Oral)  Resp 16  Ht  (1.727 m)  Wt 140 lb (63.504 kg)  BMI 21.29 kg/m2  SpO2 100%  LMP 11/30/2015 CONSTITUTIONAL: Alert and oriented and responds appropriately to questions. Well-appearing; well-nourished HEAD: Normocephalic EYES: Conjunctivae clear, PERRL ENT: normal nose; no rhinorrhea; moist mucous membranes; pharynx without lesions noted NECK: Supple, no meningismus, no LAD  CARD: RRR; S1 and S2 appreciated; no murmurs, no clicks, no rubs, no gallops RESP: Normal chest excursion without splinting or tachypnea; breath sounds clear and equal bilaterally; no wheezes, no rhonchi, no rales, no hypoxia or respiratory distress, speaking full sentences ABD/GI: Normal bowel sounds; non-distended; soft, non-tender, no rebound, no guarding, no peritoneal signs GU: Normal external genitalia. No lesions, rashes noted. Patient has a small amount of dark brown discharge consistent with recent vaginal bleeding on exam. No foul odor. No adnexal tenderness or fullness.  Patient does have mild cervical motion tenderness. Cervix is not appear friable.   BACK:  The back appears normal and is diffusely tender throughout lower lumbar area to palpation, without step-off or deformity, there is no CVA tenderness;  EXT: Normal ROM in all joints; non-tender to palpation; no edema; normal capillary refill; no cyanosis, no calf tenderness or swelling    SKIN: Normal color for age and race; warm NEURO: Moves all extremities equally, sensation to light touch intact diffusely, cranial nerves II through XII intact PSYCH: The patient's mood and manner are appropriate. Grooming and personal hygiene are appropriate.  MEDICAL DECISION MAKING: Patient here with dysuria, flank pain. Differential includes UTI,  pyelonephritis, kidney stones. Have recommended obtaining another urinalysis and urine culture. Patient was requesting a CT scan to evaluate for stones she has a family history of the same. We'll proceed with labs, CT imaging, urinalysis. She declines pelvic exam at the moment. We'll give Toradol, IV fluids and Zofran and reassess.  ED PROGRESS: Patient has had some improvement in symptoms with Toradol and Zofran. Her labs are unremarkable. Urine shows no blood and no sign of infection.  Urine pregnancy is negative. CT of her abdomen shows left extrarenal pelvis versus mild pelviectasis but no hydronephrosis or nephrolithiasis. She does have constipation but no bowel obstruction. Normal appendix. Otherwise normal CT scan. She does have a small hemangioma versus cyst in the liver that has been present since 2009. Have discussed this finding with patient. Given her persistent lower abdominal pain will proceed pelvic exam and cultures.  Pelvic exam reveals mild cervical motion tenderness without adnexal tenderness or fullness. She recently ended her menstrual cycle and does have a small amount of dark red blood in the vaginal canal but no other significant discharge. Will send gonorrhea and Chlamydia cultures. Wet prep is negative. Will treat her empirically for PID with ceftriaxone 250 mg IM today and doxycycline 100 mg twice a day for 2 weeks. We'll discharge with pain and nausea medication. I have advised her to follow-up with OB/GYN if symptoms continue. Given her otherwise benign pelvic exam I do not think that she has ovarian torsion, TOA. Do not feel she needs emergent transvaginal ultrasound at this time. No mass seen on her ovaries on CT scan. Discussed return precautions with patient and significant other. They verbalize understanding and are comfortable with this plan.     I personally performed the services described in this documentation, which was scribed in my presence. The recorded  information has been reviewed and is accurate.   KristenLayla Mawd, DO 12/05/15 [phone]

## 2015-12-04 NOTE — ED Notes (Signed)
Pt reports Nausea and Bilateral flank pain that has been going on for 2 days now.  Pt reports past recent UTIs when she was on Keflex and then Cipro.  Pt states the Cipro is not helping and neither is the pyridium this past time she has had the UTI.  She denies any fever but states she is having chills at times.

## 2015-12-05 ENCOUNTER — Emergency Department (HOSPITAL_COMMUNITY): Payer: 59

## 2015-12-05 LAB — URINALYSIS, ROUTINE W REFLEX MICROSCOPIC
Bilirubin Urine: NEGATIVE
GLUCOSE, UA: NEGATIVE mg/dL
HGB URINE DIPSTICK: NEGATIVE
Ketones, ur: NEGATIVE mg/dL
Leukocytes, UA: NEGATIVE
Nitrite: NEGATIVE
PH: 7 (ref 5.0–8.0)
Protein, ur: NEGATIVE mg/dL
SPECIFIC GRAVITY, URINE: 1.005 (ref 1.005–1.030)

## 2015-12-05 LAB — WET PREP, GENITAL
CLUE CELLS WET PREP: NONE SEEN
SPERM: NONE SEEN
TRICH WET PREP: NONE SEEN
Yeast Wet Prep HPF POC: NONE SEEN

## 2015-12-05 LAB — I-STAT CHEM 8, ED
BUN: 10 mg/dL (ref 6–20)
CALCIUM ION: 1.12 mmol/L (ref 1.12–1.23)
CHLORIDE: 102 mmol/L (ref 101–111)
CREATININE: 0.7 mg/dL (ref 0.44–1.00)
Glucose, Bld: 90 mg/dL (ref 65–99)
HCT: 42 % (ref 36.0–46.0)
Hemoglobin: 14.3 g/dL (ref 12.0–15.0)
Potassium: 3.8 mmol/L (ref 3.5–5.1)
SODIUM: 140 mmol/L (ref 135–145)
TCO2: 27 mmol/L (ref 0–100)

## 2015-12-05 LAB — POC URINE PREG, ED: PREG TEST UR: NEGATIVE

## 2015-12-05 MED ORDER — DOXYCYCLINE HYCLATE 100 MG PO TABS
100.0000 mg | ORAL_TABLET | Freq: Once | ORAL | Status: AC
  Administered 2015-12-05: 100 mg via ORAL
  Filled 2015-12-05: qty 1

## 2015-12-05 MED ORDER — DOXYCYCLINE HYCLATE 100 MG PO CAPS: 100.0000 mg | ORAL_CAPSULE | Freq: Two times a day (BID) | ORAL | Status: DC

## 2015-12-05 MED ORDER — HYDROCODONE-ACETAMINOPHEN 5-325 MG PO TABS
2.0000 | ORAL_TABLET | Freq: Once | ORAL | Status: AC
  Administered 2015-12-05: 2 via ORAL
  Filled 2015-12-05: qty 2

## 2015-12-05 MED ORDER — LIDOCAINE HCL 1 % IJ SOLN
INTRAMUSCULAR | Status: AC
  Administered 2015-12-05: 04:00:00
  Filled 2015-12-05: qty 20

## 2015-12-05 MED ORDER — CEFTRIAXONE SODIUM 250 MG IJ SOLR
250.0000 mg | Freq: Once | INTRAMUSCULAR | Status: AC
  Administered 2015-12-05: 250 mg via INTRAMUSCULAR
  Filled 2015-12-05: qty 250

## 2015-12-05 MED ORDER — FENTANYL CITRATE (PF) 100 MCG/2ML IJ SOLN
50.0000 ug | Freq: Once | INTRAMUSCULAR | Status: AC
  Administered 2015-12-05: 50 ug via INTRAVENOUS
  Filled 2015-12-05: qty 2

## 2015-12-05 MED ORDER — ONDANSETRON 4 MG PO TBDP: 4.0000 mg | ORAL_TABLET | Freq: Three times a day (TID) | ORAL | Status: DC | PRN

## 2015-12-05 MED ORDER — HYDROCODONE-ACETAMINOPHEN 5-325 MG PO TABS: 1.0000 | ORAL_TABLET | Freq: Four times a day (QID) | ORAL | Status: DC | PRN

## 2015-12-05 MED ORDER — ONDANSETRON HCL 4 MG/2ML IJ SOLN
4.0000 mg | Freq: Once | INTRAMUSCULAR | Status: AC
  Administered 2015-12-05: 4 mg via INTRAVENOUS
  Filled 2015-12-05: qty 2

## 2015-12-05 MED ORDER — FLUCONAZOLE 150 MG PO TABS: 150.0000 mg | ORAL_TABLET | Freq: Once | ORAL | Status: DC

## 2015-12-05 NOTE — ED Notes (Signed)
Per Dr. Elesa MassedWard, pt to continue taking Cipro with new scripts. Pt verbalized understanding of this and d/c instructions. Ambulatory to WR with steady gait accompanied by female visitor. NAD

## 2015-12-05 NOTE — Discharge Instructions (Signed)
Pelvic Inflammatory Disease °Pelvic inflammatory disease (PID) refers to an infection in some or all of the female organs. The infection can be in the uterus, ovaries, fallopian tubes, or the surrounding tissues in the pelvis. PID can cause abdominal or pelvic pain that comes on suddenly (acute pelvic pain). PID is a serious infection because it can lead to lasting (chronic) pelvic pain or the inability to have children (infertility). °CAUSES °This condition is most often caused by an infection that is spread during sexual contact. However, the infection can also be caused by the normal bacteria that are found in the vaginal tissues if these bacteria travel upward into the reproductive organs. PID can also occur following: °· The birth of a baby. °· A miscarriage. °· An abortion. °· Major pelvic surgery. °· The use of an intrauterine device (IUD). °· A sexual assault. °RISK FACTORS °This condition is more likely to develop in women who: °· Are younger than 35 years of age. °· Are sexually active at a young age. °· Use nonbarrier contraception. °· Have multiple sexual partners. °· Have sex with someone who has symptoms of an STD (sexually transmitted disease). °· Use oral contraception. °At times, certain behaviors can also increase the possibility of getting PID, such as: °· Using a vaginal douche. °· Having an IUD in place. °SYMPTOMS °Symptoms of this condition include: °· Abdominal or pelvic pain. °· Fever. °· Chills. °· Abnormal vaginal discharge. °· Abnormal uterine bleeding. °· Unusual pain shortly after the end of a menstrual period. °· Painful urination. °· Pain with sexual intercourse. °· Nausea and vomiting. °DIAGNOSIS °To diagnose this condition, your health care provider will do a physical exam and take your medical history. A pelvic exam typically reveals great tenderness in the uterus and the surrounding pelvic tissues. You may also have tests, such as: °· Lab tests, including a pregnancy test, blood  tests, and urine test. °· Culture tests of the vagina and cervix to check for an STD. °· Ultrasound. °· A laparoscopic procedure to look inside the pelvis. °· Examining vaginal secretions under a microscope. °TREATMENT °Treatment for this condition may involve one or more approaches. °· Antibiotic medicines may be prescribed to be taken by mouth. °· Sexual partners may need to be treated if the infection is caused by an STD. °· For more severe cases, hospitalization may be needed to give antibiotics directly into a vein through an IV tube. °· Surgery may be needed if other treatments do not help, but this is rare. °It may take weeks until you are completely well. If you are diagnosed with PID, you should also be checked for human immunodeficiency virus (HIV). Your health care provider may test you for infection again 3 months after treatment. You should not have unprotected sex. °HOME CARE INSTRUCTIONS °· Take over-the-counter and prescription medicines only as told by your health care provider. °· If you were prescribed an antibiotic medicine, take it as told by your health care provider. Do not stop taking the antibiotic even if you start to feel better. °· Do not have sexual intercourse until treatment is completed or as told by your health care provider. If PID is confirmed, your recent sexual partners will need treatment, especially if you had unprotected sex. °· Keep all follow-up visits as told by your health care provider. This is important. °SEEK MEDICAL CARE IF: °· You have increased or abnormal vaginal discharge. °· Your pain does not improve. °· You vomit. °· You have a fever. °· You   cannot tolerate your medicines.  Your partner has an STD.  You have pain when you urinate. SEEK IMMEDIATE MEDICAL CARE IF:  You have increased abdominal or pelvic pain.  You have chills.  Your symptoms are not better in 72 hours even with treatment.   This information is not intended to replace advice given to  you by your health care provider. Make sure you discuss any questions you have with your health care provider.   Document Released: 11/26/2005 Document Revised: 08/17/2015 Document Reviewed: 01/03/2015 Elsevier Interactive Patient Education 2016 [employer]  [email or web address] [address] [place], [place] [phone](336) [phone]    [facility] [email or web address] [address] [place], [place] [phone]    [facility] [address] [place], [place] [phone]) [phone]   [facility] [email or web address] [address] [place], [place] [phone](336) [phone]   [facility] [email or web address] [address] [place], [place] [phone]) [phone]   [facility] [email or web address] [address] [place], [place] [phone](336) [phone]

## 2015-12-06 LAB — URINE CULTURE: Culture: NO GROWTH

## 2015-12-09 LAB — GC/CHLAMYDIA PROBE AMP (~~LOC~~) NOT AT ARMC
Chlamydia: NEGATIVE
Neisseria Gonorrhea: NEGATIVE

## 2016-04-07 ENCOUNTER — Emergency Department (HOSPITAL_COMMUNITY): Payer: 59

## 2016-04-07 ENCOUNTER — Emergency Department (HOSPITAL_COMMUNITY)
Admission: EM | Admit: 2016-04-07 | Discharge: 2016-04-07 | Disposition: A | Discharge status: Home / Self Care | Payer: 59 | Attending: Emergency Medicine | Admitting: Emergency Medicine

## 2016-04-07 ENCOUNTER — Encounter (HOSPITAL_COMMUNITY): Payer: Self-pay | Admitting: Emergency Medicine

## 2016-04-07 DIAGNOSIS — F17210 Nicotine dependence, cigarettes, uncomplicated: Secondary | ICD-10-CM | POA: Insufficient documentation

## 2016-04-07 DIAGNOSIS — K7460 Unspecified cirrhosis of liver: Secondary | ICD-10-CM | POA: Insufficient documentation

## 2016-04-07 DIAGNOSIS — S069X1A Unspecified intracranial injury with loss of consciousness of 30 minutes or less, initial encounter: Secondary | ICD-10-CM | POA: Insufficient documentation

## 2016-04-07 DIAGNOSIS — Z79891 Long term (current) use of opiate analgesic: Secondary | ICD-10-CM | POA: Insufficient documentation

## 2016-04-07 DIAGNOSIS — Z792 Long term (current) use of antibiotics: Secondary | ICD-10-CM | POA: Insufficient documentation

## 2016-04-07 DIAGNOSIS — Y999 Unspecified external cause status: Secondary | ICD-10-CM | POA: Insufficient documentation

## 2016-04-07 DIAGNOSIS — S0990XA Unspecified injury of head, initial encounter: Secondary | ICD-10-CM

## 2016-04-07 DIAGNOSIS — Y939 Activity, unspecified: Secondary | ICD-10-CM | POA: Insufficient documentation

## 2016-04-07 DIAGNOSIS — Z79899 Other long term (current) drug therapy: Secondary | ICD-10-CM | POA: Insufficient documentation

## 2016-04-07 DIAGNOSIS — W01198A Fall on same level from slipping, tripping and stumbling with subsequent striking against other object, initial encounter: Secondary | ICD-10-CM | POA: Insufficient documentation

## 2016-04-07 DIAGNOSIS — Y929 Unspecified place or not applicable: Secondary | ICD-10-CM | POA: Insufficient documentation

## 2016-04-07 MED ORDER — ACETAMINOPHEN 500 MG PO TABS
1000.0000 mg | ORAL_TABLET | Freq: Once | ORAL | Status: AC
  Administered 2016-04-07: 1000 mg via ORAL
  Filled 2016-04-07: qty 2
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
## 2016-04-07 NOTE — ED Notes (Signed)
Patient transported to CT 
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
## 2016-04-07 NOTE — Discharge Instructions (Signed)
Head and neck CT were negative for acute injuries. You may have a headache for a little while.  You may take tylenol or motrin for this as needed. Recommend to rest today, take it easy. Follow-up with your primary care physician. Return here for new concerns.

## 2016-05-25 ENCOUNTER — Emergency Department (HOSPITAL_COMMUNITY)
Admission: EM | Admit: 2016-05-25 | Discharge: 2016-05-25 | Disposition: A | Discharge status: Home / Self Care | Payer: Self-pay | Attending: Emergency Medicine | Admitting: Emergency Medicine

## 2016-05-25 ENCOUNTER — Emergency Department (HOSPITAL_COMMUNITY): Payer: Self-pay

## 2016-05-25 ENCOUNTER — Encounter (HOSPITAL_COMMUNITY): Payer: Self-pay | Admitting: *Deleted

## 2016-05-25 DIAGNOSIS — M79632 Pain in left forearm: Secondary | ICD-10-CM | POA: Insufficient documentation

## 2016-05-25 DIAGNOSIS — R51 Headache: Secondary | ICD-10-CM | POA: Insufficient documentation

## 2016-05-25 DIAGNOSIS — R519 Headache, unspecified: Secondary | ICD-10-CM

## 2016-05-25 DIAGNOSIS — Z791 Long term (current) use of non-steroidal anti-inflammatories (NSAID): Secondary | ICD-10-CM | POA: Insufficient documentation

## 2016-05-25 DIAGNOSIS — Y929 Unspecified place or not applicable: Secondary | ICD-10-CM | POA: Insufficient documentation

## 2016-05-25 DIAGNOSIS — Y939 Activity, unspecified: Secondary | ICD-10-CM | POA: Insufficient documentation

## 2016-05-25 DIAGNOSIS — Z79899 Other long term (current) drug therapy: Secondary | ICD-10-CM | POA: Insufficient documentation

## 2016-05-25 DIAGNOSIS — Y999 Unspecified external cause status: Secondary | ICD-10-CM | POA: Insufficient documentation

## 2016-05-25 DIAGNOSIS — F17210 Nicotine dependence, cigarettes, uncomplicated: Secondary | ICD-10-CM | POA: Insufficient documentation

## 2016-05-25 DIAGNOSIS — M25551 Pain in right hip: Secondary | ICD-10-CM | POA: Insufficient documentation

## 2016-05-25 LAB — I-STAT BETA HCG BLOOD, ED (MC, WL, AP ONLY): I-stat hCG, quantitative: <5 m[IU]/mL (ref <5)

## 2016-05-25 MED ORDER — ONDANSETRON HCL 4 MG/2ML IJ SOLN
4.0000 mg | Freq: Once | INTRAMUSCULAR | Status: AC
  Administered 2016-05-25: 4 mg via INTRAVENOUS
  Filled 2016-05-25: qty 2

## 2016-05-25 MED ORDER — SODIUM CHLORIDE 0.9 % IV BOLUS (SEPSIS)
1000.0000 mL | Freq: Once | INTRAVENOUS | Status: AC
  Administered 2016-05-25: 1000 mL via INTRAVENOUS

## 2016-05-25 MED ORDER — ACETAMINOPHEN 500 MG PO TABS
1000.0000 mg | ORAL_TABLET | Freq: Once | ORAL | Status: AC
  Administered 2016-05-25: 1000 mg via ORAL
  Filled 2016-05-25: qty 2

## 2016-05-25 NOTE — ED Notes (Signed)
Patient left AMA.
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
## 2016-05-25 NOTE — ED Notes (Signed)
At the time writer arrived to start IV, pt on phone begging person to come up here, "I love you" I" I need you"  "Please come" she proceeded to state"Marcus don't be this way, help me, please come, I am hurting." Pt continues to to beg as person on phone uses continuous cursing, "GD and F**K" multiple times. Writer asked pt to hang up phone as no one deserves to be talked to in that manner. Pt reluctant, other person finally hung up.
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
## 2016-05-25 NOTE — ED Notes (Signed)
Pt stating she wants him to come back when her arrives, it has been explained that he will not be allowed back to see her due to reason she is here. Clearly stated to writer, I will leave when he gets here, she is aware we will notify of his presence in the facility although he will not be allowed back to see her. She is aware she can sign and leave AMA.  Writer updated EDP of conversation.

## 2016-05-25 NOTE — ED Notes (Signed)
Patient left without updated vitals and walked out the doors with IV still in;however, we was able to get her outside the hospital entrance to remove her IV.
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
## 2016-05-25 NOTE — ED Notes (Signed)
Patient transported to CT 

## 2016-06-12 ENCOUNTER — Encounter (HOSPITAL_COMMUNITY): Payer: Self-pay | Admitting: *Deleted

## 2016-06-12 ENCOUNTER — Ambulatory Visit (HOSPITAL_COMMUNITY)
Admission: EM | Admit: 2016-06-12 | Discharge: 2016-06-12 | Disposition: A | Discharge status: Home / Self Care | Payer: 59 | Attending: Family Medicine | Admitting: Family Medicine

## 2016-06-12 DIAGNOSIS — T148 Other injury of unspecified body region: Secondary | ICD-10-CM | POA: Diagnosis not present

## 2016-06-12 DIAGNOSIS — W57XXXA Bitten or stung by nonvenomous insect and other nonvenomous arthropods, initial encounter: Secondary | ICD-10-CM

## 2016-06-12 DIAGNOSIS — R600 Localized edema: Secondary | ICD-10-CM

## 2016-06-12 MED ORDER — FLUTICASONE PROPIONATE 0.05 % EX CREA: TOPICAL_CREAM | Freq: Two times a day (BID) | CUTANEOUS | Status: DC

## 2016-06-12 MED ORDER — FUROSEMIDE 40 MG PO TABS: 40.0000 mg | ORAL_TABLET | Freq: Every day | ORAL | Status: DC

## 2016-06-12 NOTE — ED Provider Notes (Signed)
CSN: [id number]     Arrival date & time 06/12/16  1736 History   First MD Initiated Contact with Patient 06/12/16 1746     Chief Complaint  Patient presents with  . Edema   (Consider location/radiation/quality/duration/timing/severity/associated sxs/prior Treatment) Patient is a 35 y.o. female presenting with rash. The history is provided by the patient and the spouse.  Rash Location:  Leg Leg rash location:  L upper leg Quality: itchiness and redness   Severity:  Mild Onset quality:  Sudden Duration:  2 days Progression:  Unchanged Chronicity:  New Context: insect bite/sting   Relieved by:  None tried Ineffective treatments:  None tried   Past Medical History  Diagnosis Date  . Gall stones   . Mastoiditis   . Ovarian cyst   . Pancreatitis   . Jaundice   . Dysrhythmia   . Anxiety   . Headache   . Psoriasis   . Hypermobility of joint     Ehler Danlos Syndrome  . Infection     bladder  . Anorexia   . Migraine   . Cirrhosis (HCC)   . Morton neuroma   . Scoliosis    Past Surgical History  Procedure Laterality Date  . Dilation and curettage of uterus  2002  . Laparoscopic cholecystectomy  2006   Family History  Problem Relation Age of Onset  . Heart disease Father   . Cancer Father     skin  . Osteoarthritis Father    Social History  Substance Use Topics  . Smoking status: Current Every Day Smoker -- 1.00 packs/day    Types: Cigarettes  . Smokeless tobacco: Never Used  . Alcohol Use: Yes     Comment: once monthly   OB History    Gravida Para Term Preterm AB TAB SAB Ectopic Multiple Living   1    1  1         Review of Systems  Cardiovascular: Positive for leg swelling.  Skin: Positive for rash.  All other systems reviewed and are negative.   Allergies  Sulfa antibiotics and Morphine and related  Home Medications   Prior to Admission medications   Medication Sig Start Date End Date Taking? Authorizing Provider  cetirizine (ZYRTEC) 10 MG tablet  Take 10 mg by mouth daily.    Historical Provider, MD  ciprofloxacin (CIPRO) 500 MG tablet Take 1 tablet (500 mg total) by mouth 2 (two) times daily. X 7 days 11/30/15   Domenick GongAshley Mortenson, MD  clonazePAM (KLONOPIN) 1 MG tablet Take 1 mg by mouth 2 (two) times daily as needed for anxiety.     Historical Provider, MD  doxycycline (VIBRAMYCIN) 100 MG capsule Take 1 capsule (100 mg total) by mouth 2 (two) times daily. Patient not taking: Reported on 04/07/2016 12/05/15   Kristen N Ward, DO  fluconazole (DIFLUCAN) 150 MG tablet Take 1 tablet (150 mg total) by mouth once. Patient not taking: Reported on 04/07/2016 12/05/15   Kristen N Ward, DO  fluticasone (CUTIVATE) 0.05 % cream Apply topically 2 (two) times daily. 06/12/16   Linna HoffJames D Damiyah Ditmars, MD  furosemide (LASIX) 40 MG tablet Take 1 tablet (40 mg total) by mouth daily. 06/12/16   Linna HoffJames D Ahlia Lemanski, MD  HYDROcodone-acetaminophen (NORCO/VICODIN) 5-325 MG tablet Take 1-2 tablets by mouth every 6 (six) hours as needed. Patient not taking: Reported on 04/07/2016 12/05/15   Kristen N Ward, DO  ibuprofen (ADVIL,MOTRIN) 200 MG tablet Take 200-400 mg by mouth every 6 (six) hours as needed  for fever or moderate pain.    Historical Provider, MD  Melatonin 10 MG CAPS Take 10 mg by mouth at bedtime.     Historical Provider, MD  ondansetron (ZOFRAN ODT) 4 MG disintegrating tablet Take 1 tablet (4 mg total) by mouth every 8 (eight) hours as needed for nausea or vomiting. Patient not taking: Reported on 04/07/2016 12/05/15   Layla MawKristen N Ward, DO   Meds Ordered and Administered this Visit  Medications - No data to display  BP 138/96 mmHg  Pulse 78  Temp(Src) 98.6 F (37 C) (Oral)  Resp 18  SpO2 98%  LMP 06/12/2016 No data found.   Physical Exam  Constitutional: She is oriented to person, place, and time. She appears well-developed and well-nourished. No distress.  Cardiovascular: Normal rate and normal heart sounds.   Pulmonary/Chest: Effort normal and breath sounds  normal.  Musculoskeletal: She exhibits edema.  Neurological: She is alert and oriented to person, place, and time.  Skin: Skin is warm and dry. Rash noted. There is erythema.  Nursing note and vitals reviewed.   ED Course  Procedures (including critical care time)  Labs Review Labs Reviewed - No data to display  Imaging Review No results found.   Visual Acuity Review  Right Eye Distance:   Left Eye Distance:   Bilateral Distance:    Right Eye Near:   Left Eye Near:    Bilateral Near:         MDM   1. Insect bite   2. Localized edema       Linna HoffJames D Amardeep Beckers, MD 06/12/16 [phone]

## 2016-06-12 NOTE — ED Notes (Signed)
Pt reports  Swelling  Of  r  Foot    With   An   Area      To  Her  l  Upper thigh      That  Appears to   Is  Tender and  Slightly  Swollen        Pt  denys  Any  Chest pain or  Any  Shortness  Of  Breath   She  Is  Sitting  Upright  On the exam table  Speaking in  Complete  sentances

## 2016-07-25 IMAGING — CT CT ABD-PELV W/O CM
2 of 3 series · 16 of 33 positions shown, 18 images · non-contrast
Comparison: CT dated 09/06/2008

CLINICAL DATA: 34-year-old female with bilateral flank pain, left
greater right

EXAM:
CT ABDOMEN AND PELVIS WITHOUT CONTRAST
TECHNIQUE: Multidetector CT imaging of the abdomen and pelvis was performed
following the standard protocol without IV contrast.

[Series 3: lung · axial · 0.74mm/px · z∈[-18,+62]mm · 13 of 19 slices shown, 15 images]
[im 2/19  soft-tissue]
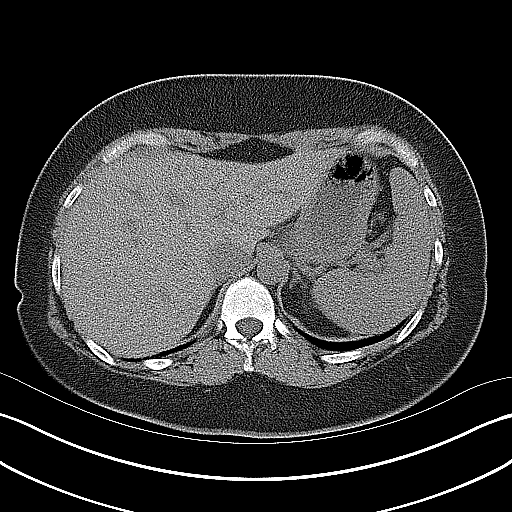
[im 2/19  bone]
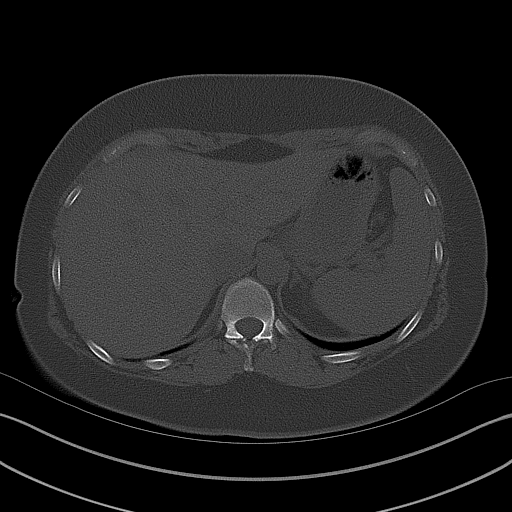
[im 3/19  soft-tissue]
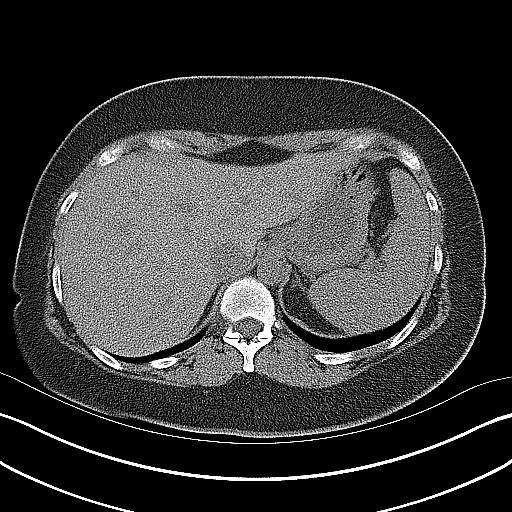
[im 5/19  soft-tissue]
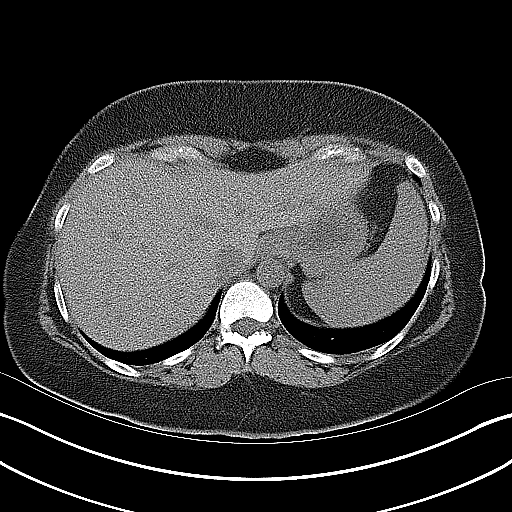
[im 6/19  soft-tissue]
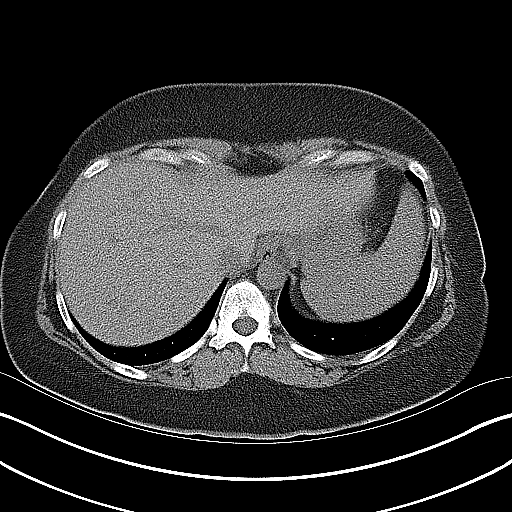
[im 7/19  soft-tissue]
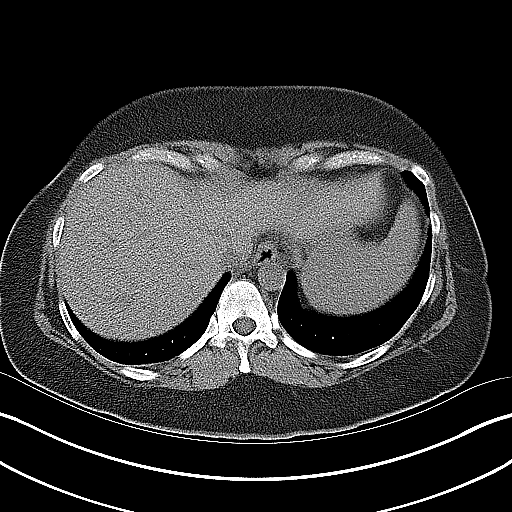
[im 9/19  soft-tissue]
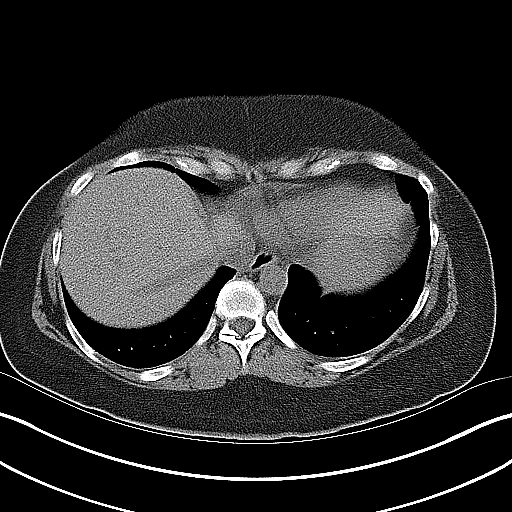
[im 10/19  soft-tissue]
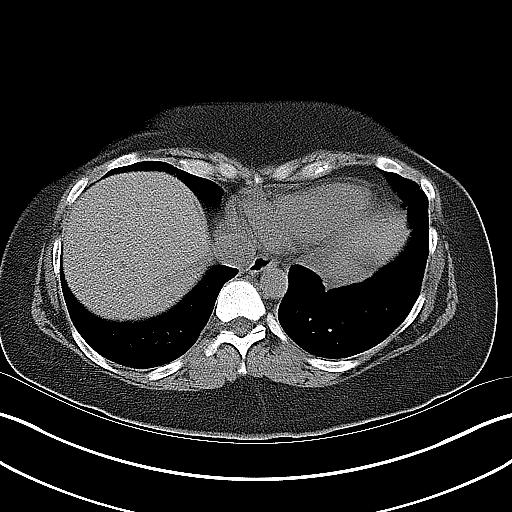
[im 11/19  soft-tissue]
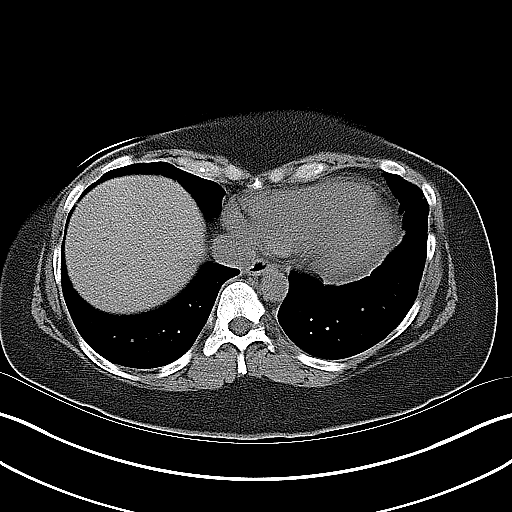
[im 13/19  soft-tissue]
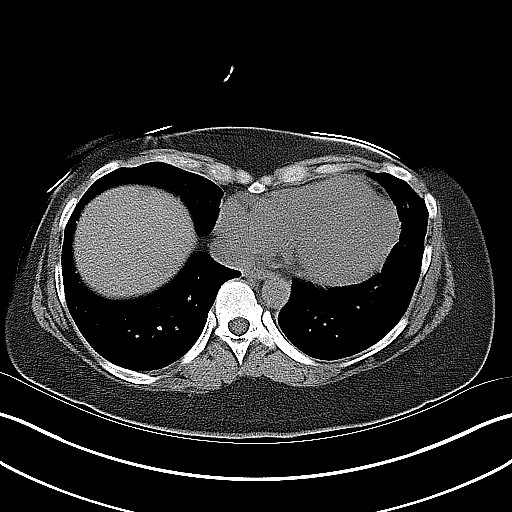
[im 13/19  bone]
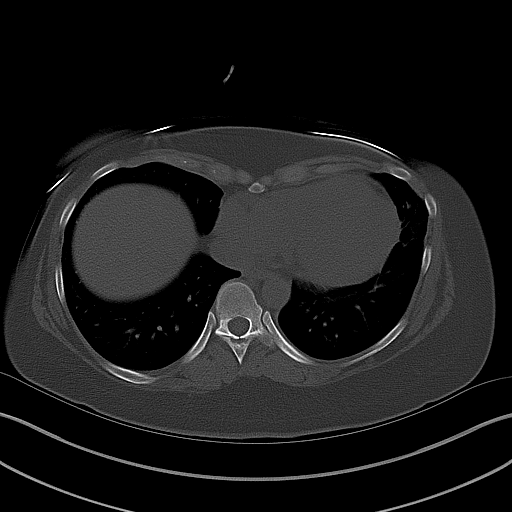
[im 14/19  soft-tissue]
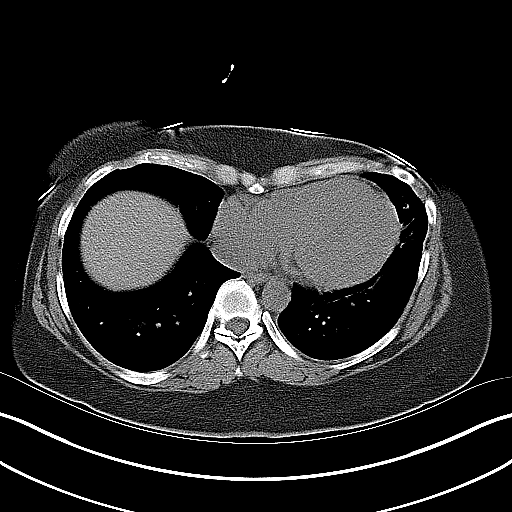
[im 15/19  soft-tissue]
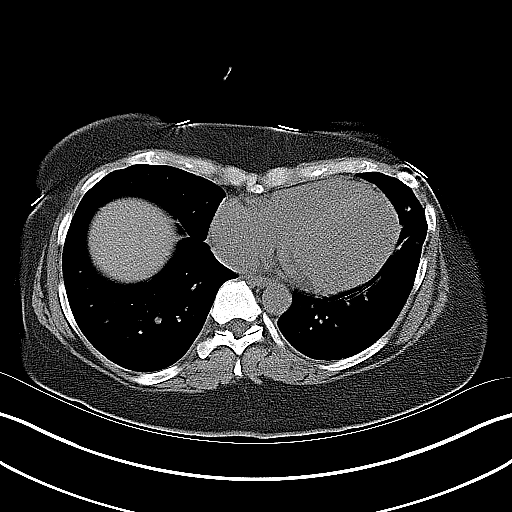
[im 17/19  soft-tissue]
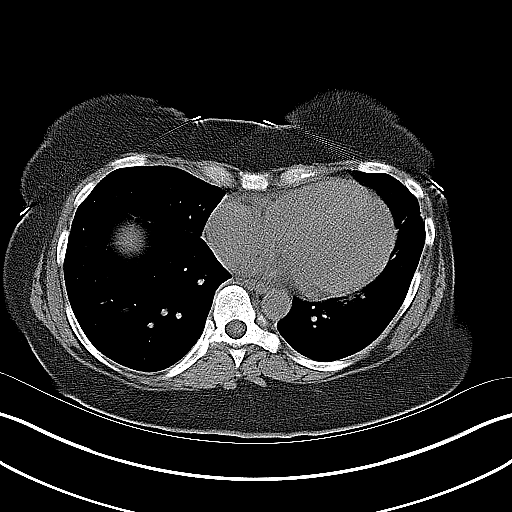
[im 18/19  soft-tissue]
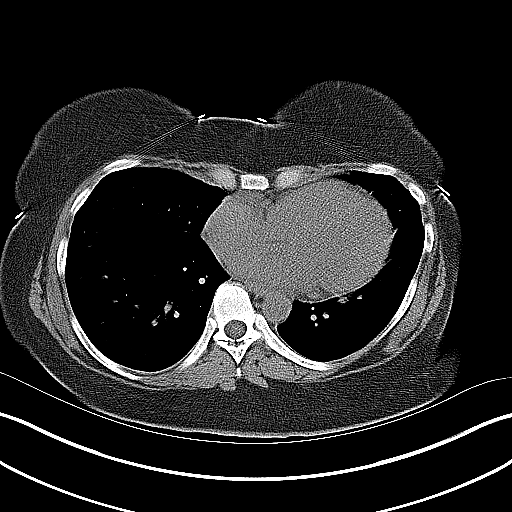

[Series 4: coronal · coronal · 0.71mm/px · 3 of 86 slices shown]
[im 29/86  soft-tissue]
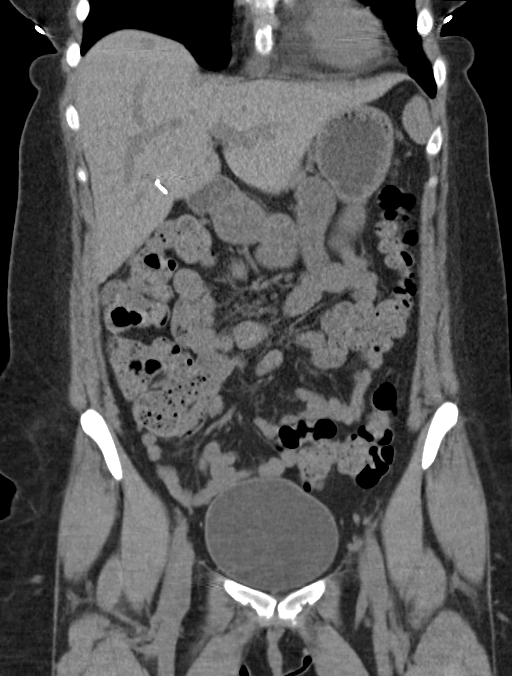
[im 38/86  soft-tissue]
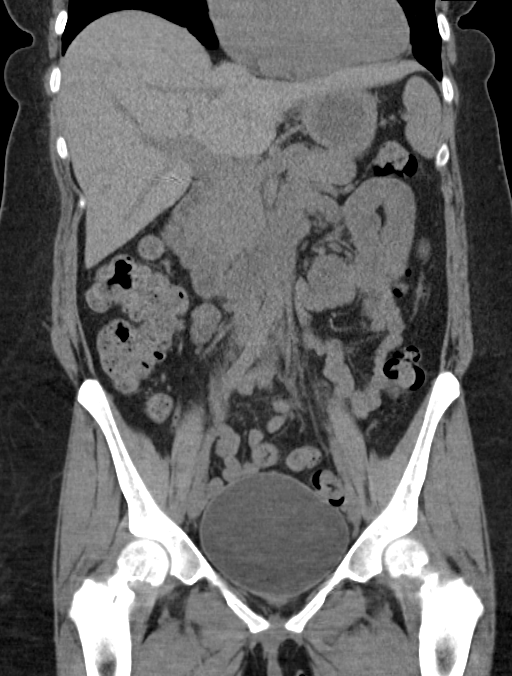
[im 48/86  soft-tissue]
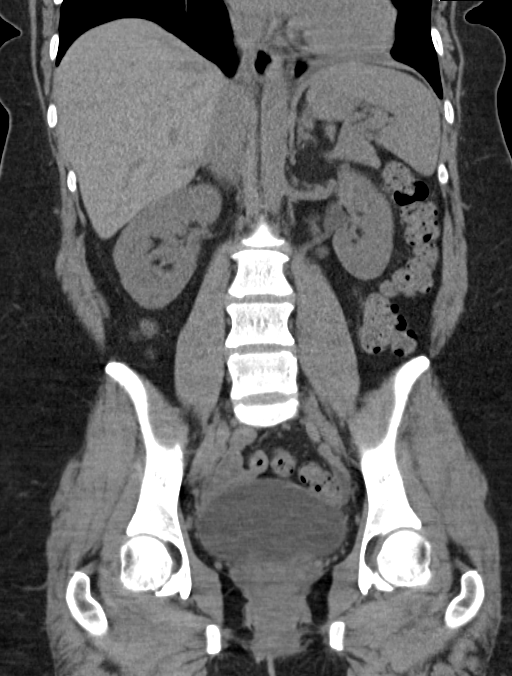

[16 of 33 positions shown; findings below may reference images not displayed]

FINDINGS: Evaluation of this exam is limited in the absence of intravenous
contrast.

The visualized lung bases are clear. No intra-abdominal free air or
free fluid.

Cholecystectomy. An ill-defined subcentimeter hypodense lesion in
the dome of the liver (series 2, image 7) is not well characterized
but may have been present on the CT dated 09/06/2008 and may
represent a small hemangioma or cyst. MRI may provide better
characterization. The liver is otherwise unremarkable. The pancreas,
spleen, and adrenal glands appear unremarkable. There is an
extrarenal pelvis on the left. There is mild fullness of the left
renal pelvis. No stone identified. The right kidney is unremarkable.
The urinary bladder is unremarkable. The uterus and the visualized
ovaries are grossly unremarkable.

Constipation. No evidence of bowel obstruction or inflammation.
Normal appendix. New the

The visualized abdominal aorta and IVC appear grossly unremarkable
on this noncontrast study. No portal venous gas identified. There is
no adenopathy. The abdominal wall soft tissues appear unremarkable.
The soft tissues are intact.
IMPRESSION: Left extrarenal pelvis versus mild pelviectasis. No hydronephrosis
or nephrolithiasis.

Constipation. No evidence of bowel obstruction or inflammation.
Normal appendix.

## 2016-10-23 ENCOUNTER — Encounter (HOSPITAL_COMMUNITY): Payer: Self-pay | Admitting: Emergency Medicine

## 2016-10-23 ENCOUNTER — Ambulatory Visit (HOSPITAL_COMMUNITY)
Admission: EM | Admit: 2016-10-23 | Discharge: 2016-10-23 | Disposition: A | Discharge status: Home / Self Care | Payer: 59 | Attending: Family Medicine | Admitting: Family Medicine

## 2016-10-23 DIAGNOSIS — L03116 Cellulitis of left lower limb: Secondary | ICD-10-CM | POA: Diagnosis not present

## 2016-10-23 DIAGNOSIS — L089 Local infection of the skin and subcutaneous tissue, unspecified: Secondary | ICD-10-CM | POA: Diagnosis not present

## 2016-10-23 DIAGNOSIS — T148XXA Other injury of unspecified body region, initial encounter: Secondary | ICD-10-CM

## 2016-10-23 MED ORDER — FLUCONAZOLE 150 MG PO TABS: ORAL_TABLET | ORAL | 0 refills | Status: DC

## 2016-10-23 MED ORDER — CEPHALEXIN 500 MG PO CAPS: 500.0000 mg | ORAL_CAPSULE | Freq: Four times a day (QID) | ORAL | 0 refills | Status: DC

## 2016-10-23 NOTE — Discharge Instructions (Signed)
Keep clean and dry. Wash with mild soap and water at least once daily, and then pad dry. Do not need to apply ointments or creams at this time. May leave open when resting at home. When wearing shoes or about keep it covered with a large Band-Aid or small dressing. Watch for any signs of infection, increased pain, swelling, redness, red streaks, pus draining and looking worse. If these occur recheck promptly. May return.

## 2016-10-23 NOTE — ED Provider Notes (Signed)
CSN: [id number]     Arrival date & time 10/23/16  1842 History   First MD Initiated Contact with Patient 10/23/16 1919     Chief Complaint  Patient presents with  . Wound Check   (Consider location/radiation/quality/duration/timing/severity/associated sxs/prior Treatment) 35 year old female states that she scraped her right foot last week. She states since that time she has developed some swelling and minor erythema around the wound. She is concerned about infection.  Her second complaint is that of weight gain, hair loss and concern about possible hyperthyroidism as being the cause. She is requesting a workup evaluation for all of the symptoms which have been occurring for several months.      Past Medical History:  Diagnosis Date  . Anorexia   . Anxiety   . Cirrhosis (HCC)   . Dysrhythmia   . Gall stones   . Headache   . Hypermobility of joint    Ehler Danlos Syndrome  . Infection    bladder  . Jaundice   . Mastoiditis   . Migraine   . Morton neuroma   . Ovarian cyst   . Pancreatitis   . Psoriasis   . Scoliosis    Past Surgical History:  Procedure Laterality Date  . DILATION AND CURETTAGE OF UTERUS  2002  . LAPAROSCOPIC CHOLECYSTECTOMY  2006   Family History  Problem Relation Age of Onset  . Heart disease Father   . Cancer Father     skin  . Osteoarthritis Father    Social History  Substance Use Topics  . Smoking status: Current Every Day Smoker    Packs/day: 1.00    Types: Cigarettes  . Smokeless tobacco: Never Used  . Alcohol use Yes     Comment: once monthly   OB History    Gravida Para Term Preterm AB Living   1       1     SAB TAB Ectopic Multiple Live Births   1             Review of Systems  Constitutional: Negative for activity change and fever.  HENT: Negative.   Respiratory: Negative.  Negative for shortness of breath and wheezing.   Cardiovascular: Negative for chest pain.  Endocrine: Positive for cold intolerance.  Genitourinary:  Negative.   Skin: Positive for color change and wound.  Neurological: Negative.   All other systems reviewed and are negative.   Allergies  Sulfa antibiotics and Morphine and related  Home Medications   Prior to Admission medications   Medication Sig Start Date End Date Taking? Authorizing Provider  cephALEXin (KEFLEX) 500 MG capsule Take 1 capsule (500 mg total) by mouth 4 (four) times daily. 10/23/16   Hayden Rasmussenavid Brysin Towery, NP  cetirizine (ZYRTEC) 10 MG tablet Take 10 mg by mouth daily.    Historical Provider, MD  clonazePAM (KLONOPIN) 1 MG tablet Take 1 mg by mouth 2 (two) times daily as needed for anxiety.     Historical Provider, MD  fluconazole (DIFLUCAN) 150 MG tablet 1 tab po x 1. May repeat in 72 hours if no improvement 10/23/16   Hayden Rasmussenavid Alizaya Oshea, NP  fluticasone (CUTIVATE) 0.05 % cream Apply topically 2 (two) times daily. 06/12/16   Linna HoffJames D Kindl, MD  furosemide (LASIX) 40 MG tablet Take 1 tablet (40 mg total) by mouth daily. 06/12/16   Linna HoffJames D Kindl, MD  ibuprofen (ADVIL,MOTRIN) 200 MG tablet Take 200-400 mg by mouth every 6 (six) hours as needed for fever or moderate pain.  Historical Provider, MD  Melatonin 10 MG CAPS Take 10 mg by mouth at bedtime.     Historical Provider, MD   Meds Ordered and Administered this Visit  Medications - No data to display  BP 143/98 (BP Location: Left Arm)   Pulse 75   Temp 97.9 F (36.6 C) (Oral)   Resp 16   LMP 09/25/2016 (Approximate)   SpO2 100%  No data found.   Physical Exam  Constitutional: She is oriented to person, place, and time. She appears well-developed and well-nourished. No distress.  HENT:  Head: Normocephalic and atraumatic.  Neck: Normal range of motion. Neck supple.  Cardiovascular: Normal rate.   Pulmonary/Chest: Effort normal.  Musculoskeletal: She exhibits edema.  Neurological: She is alert and oriented to person, place, and time.  Skin: Skin is warm and dry.  There is a 3 cm superficial when he or abrasion to the lateral  aspect of the right foot. It involves the dermis only. Currently there is no drainage. It is dry. There is minor erythema to the lateral aspect of the foot and there is swelling involving the foot and toes. No lymphangitis. Positive for local tenderness.  Nursing note and vitals reviewed.   Urgent Care Course   Clinical Course     Procedures (including critical care time)  Labs Review Labs Reviewed - No data to display  Imaging Review No results found.   Visual Acuity Review  Right Eye Distance:   Left Eye Distance:   Bilateral Distance:    Right Eye Near:   Left Eye Near:    Bilateral Near:         MDM   1. Wound infection   2. Cellulitis of left foot    Keep clean and dry. Wash with mild soap and water at least once daily, and then pad dry. Do not need to apply ointments or creams at this time. May leave open when resting at home. When wearing shoes or about keep it covered with a large Band-Aid or small dressing. Watch for any signs of infection, increased pain, swelling, redness, red streaks, pus draining and looking worse. If these occur recheck promptly. May return. Meds ordered this encounter  Medications  . cephALEXin (KEFLEX) 500 MG capsule    Sig: Take 1 capsule (500 mg total) by mouth 4 (four) times daily.    Dispense:  28 capsule    Refill:  0    Order Specific Question:   Supervising Provider    Answer:   Linna HoffKINDL, JAMES D [phone]]  . fluconazole (DIFLUCAN) 150 MG tablet    Sig: 1 tab po x 1. May repeat in 72 hours if no improvement    Dispense:  2 tablet    Refill:  0    Order Specific Question:   Supervising Provider    Answer:   Linna HoffKINDL, JAMES D [[id number]]  wound cleaning and dressing     Hayden Rasmussenavid Teshawn Moan, NP 10/23/16 2019

## 2016-10-23 NOTE — ED Triage Notes (Signed)
[facility]. The patient reported that she had a wound on her right foot for over 1 week and was afraid that it may have become infected.

## 2016-12-30 ENCOUNTER — Ambulatory Visit (HOSPITAL_COMMUNITY)
Admission: EM | Admit: 2016-12-30 | Discharge: 2016-12-30 | Disposition: A | Discharge status: Home / Self Care | Payer: 59 | Attending: Emergency Medicine | Admitting: Emergency Medicine

## 2016-12-30 ENCOUNTER — Encounter (HOSPITAL_COMMUNITY): Payer: Self-pay | Admitting: *Deleted

## 2016-12-30 DIAGNOSIS — Z87440 Personal history of urinary (tract) infections: Secondary | ICD-10-CM | POA: Insufficient documentation

## 2016-12-30 DIAGNOSIS — Z882 Allergy status to sulfonamides status: Secondary | ICD-10-CM | POA: Insufficient documentation

## 2016-12-30 DIAGNOSIS — Z87891 Personal history of nicotine dependence: Secondary | ICD-10-CM | POA: Insufficient documentation

## 2016-12-30 DIAGNOSIS — R3589 Other polyuria: Secondary | ICD-10-CM

## 2016-12-30 DIAGNOSIS — R300 Dysuria: Secondary | ICD-10-CM | POA: Insufficient documentation

## 2016-12-30 DIAGNOSIS — Z885 Allergy status to narcotic agent status: Secondary | ICD-10-CM | POA: Insufficient documentation

## 2016-12-30 DIAGNOSIS — R358 Other polyuria: Secondary | ICD-10-CM | POA: Insufficient documentation

## 2016-12-30 LAB — POCT URINALYSIS DIP (DEVICE)
BILIRUBIN URINE: NEGATIVE
Glucose, UA: NEGATIVE mg/dL
Ketones, ur: NEGATIVE mg/dL
Nitrite: NEGATIVE
PH: 7 (ref 5.0–8.0)
Protein, ur: NEGATIVE mg/dL
SPECIFIC GRAVITY, URINE: 1.01 (ref 1.005–1.030)
Urobilinogen, UA: 0.2 mg/dL (ref 0.0–1.0)

## 2016-12-30 LAB — POCT PREGNANCY, URINE: Preg Test, Ur: NEGATIVE

## 2016-12-30 MED ORDER — PHENAZOPYRIDINE HCL 200 MG PO TABS: 200.0000 mg | ORAL_TABLET | Freq: Three times a day (TID) | ORAL | 0 refills | Status: DC

## 2016-12-30 NOTE — ED Triage Notes (Signed)
C/O bloating, dysuria, polyuria x 3 days.  Has felt feverish.  Denies any flank or back pain.

## 2016-12-30 NOTE — ED Provider Notes (Signed)
CSN: [id number]     Arrival date & time 12/30/16  1516 History   First MD Initiated Contact with Patient 12/30/16 1747     Chief Complaint  Patient presents with  . Dysuria   (Consider location/radiation/quality/duration/timing/severity/associated sxs/prior Treatment) HPI Elaine [name] is a 36 y.o. female presenting to UC with c/o 3 days of polyuria, dysuria and bloating. Hx of recurrent UTIs but notes the last one was over 1 year ago. Denies vaginal symptoms. Denies fever, chills, n/v/d/ no back pain.  She has not tried anything for her symptoms.   Past Medical History:  Diagnosis Date  . Anorexia   . Anxiety   . Dysrhythmia   . Gall stones   . Headache   . Hypermobility of joint    Ehler Danlos Syndrome  . Infection    bladder  . Jaundice   . Mastoiditis   . Migraine   . Morton neuroma   . Ovarian cyst   . Pancreatitis   . Psoriasis   . Psoriasis   . Scoliosis    Past Surgical History:  Procedure Laterality Date  . DILATION AND CURETTAGE OF UTERUS  2002  . LAPAROSCOPIC CHOLECYSTECTOMY  2006   Family History  Problem Relation Age of Onset  . Heart disease Father   . Cancer Father     skin  . Osteoarthritis Father    Social History  Substance Use Topics  . Smoking status: Former Smoker    Packs/day: 1.00    Types: Cigarettes  . Smokeless tobacco: Never Used  . Alcohol use No   OB History    Gravida Para Term Preterm AB Living   1       1     SAB TAB Ectopic Multiple Live Births   1             Review of Systems  Constitutional: Negative for chills and fever.  Gastrointestinal: Negative for abdominal pain, diarrhea, nausea and vomiting.  Genitourinary: Positive for dysuria, frequency and urgency. Negative for hematuria, pelvic pain, vaginal bleeding, vaginal discharge and vaginal pain.  Musculoskeletal: Negative for back pain and myalgias.    Allergies  Sulfa antibiotics and Morphine and related  Home Medications   Prior to Admission medications    Medication Sig Start Date End Date Taking? Authorizing Provider  cetirizine (ZYRTEC) 10 MG tablet Take 10 mg by mouth daily.   Yes Historical Provider, MD  ibuprofen (ADVIL,MOTRIN) 200 MG tablet Take 200-400 mg by mouth every 6 (six) hours as needed for fever or moderate pain.   Yes Historical Provider, MD  Melatonin 10 MG CAPS Take 10 mg by mouth at bedtime.    Yes Historical Provider, MD  cephALEXin (KEFLEX) 500 MG capsule Take 1 capsule (500 mg total) by mouth 4 (four) times daily. 10/23/16   Hayden Rasmussenavid Mabe, NP  clonazePAM (KLONOPIN) 1 MG tablet Take 1 mg by mouth 2 (two) times daily as needed for anxiety.     Historical Provider, MD  fluconazole (DIFLUCAN) 150 MG tablet 1 tab po x 1. May repeat in 72 hours if no improvement 10/23/16   Hayden Rasmussenavid Mabe, NP  fluticasone (CUTIVATE) 0.05 % cream Apply topically 2 (two) times daily. 06/12/16   Linna HoffJames D Kindl, MD  furosemide (LASIX) 40 MG tablet Take 1 tablet (40 mg total) by mouth daily. 06/12/16   Linna HoffJames D Kindl, MD  phenazopyridine (PYRIDIUM) 200 MG tablet Take 1 tablet (200 mg total) by mouth 3 (three) times daily. 12/30/16  Junius Finner, PA-C   Meds Ordered and Administered this Visit  Medications - No data to display  BP 129/80   Pulse 68   Temp 98.6 F (37 C) (Oral)   Resp 16   LMP 12/24/2016 (Approximate)   SpO2 100%  No data found.   Physical Exam  Constitutional: She is oriented to person, place, and time. She appears well-developed and well-nourished. No distress.  HENT:  Head: Normocephalic and atraumatic.  Mouth/Throat: Oropharynx is clear and moist.  Eyes: EOM are normal.  Neck: Normal range of motion.  Cardiovascular: Normal rate and regular rhythm.   Pulmonary/Chest: Effort normal and breath sounds normal. No respiratory distress. She has no wheezes. She has no rales.  Abdominal: Soft. She exhibits no distension. There is no tenderness. There is no CVA tenderness.  Musculoskeletal: Normal range of motion.  Neurological: She is  alert and oriented to person, place, and time.  Skin: Skin is warm and dry. She is not diaphoretic.  Psychiatric: She has a normal mood and affect. Her behavior is normal.  Nursing note and vitals reviewed.   Urgent Care Course     Procedures (including critical care time)  Labs Review Labs Reviewed  POCT URINALYSIS DIP (DEVICE) - Abnormal; Notable for the following:       Result Value   Hgb urine dipstick TRACE (*)    Leukocytes, UA TRACE (*)    All other components within normal limits  URINE CULTURE  POCT PREGNANCY, URINE    Imaging Review No results found.    MDM   1. Dysuria   2. Polyuria    Pt c/o 3 days of urinary symptoms including dysuria and polyuria. Hx of recurrent UTIs per pt.    UA no strong evidence of UTI.   Will send urine culture.   Rx: Pyridium Encouraged f/u with OB/GYN if not improving, especially if urine culture comes back negative. Patient verbalized understanding and agreement with treatment plan.    Junius Finner, PA-C 12/30/16 1902

## 2016-12-30 NOTE — Discharge Instructions (Signed)
°  The rapid urine test today did not indicate an infection, however, a more detailed test known as a urine culture has been sent to the lab.  This test usually takes about 2-3 days to come back.  If an antibiotic is indicated, you will be notified and medication will be called into your pharmacy.  If you do not hear back by Wednesday of this week, please call this office to check on your lab results.  If the culture comes back normal and you are still having symptoms, it is recommended you follow up with an OB/GYN (women's doctor) for further evaluation of symptoms.  Pyridium has been prescribed to help with urinary discomfort and bladder spasms.  This medication can cause your urine to be orange, this is normal.

## 2017-01-01 LAB — URINE CULTURE: Culture: <10000 — AB

## 2017-01-05 ENCOUNTER — Encounter (HOSPITAL_COMMUNITY): Payer: Self-pay

## 2017-01-05 ENCOUNTER — Emergency Department (HOSPITAL_COMMUNITY)
Admission: EM | Admit: 2017-01-05 | Discharge: 2017-01-05 | Disposition: A | Discharge status: Home / Self Care | Payer: Self-pay | Attending: Emergency Medicine | Admitting: Emergency Medicine

## 2017-01-05 DIAGNOSIS — R3915 Urgency of urination: Secondary | ICD-10-CM | POA: Insufficient documentation

## 2017-01-05 DIAGNOSIS — R350 Frequency of micturition: Secondary | ICD-10-CM | POA: Insufficient documentation

## 2017-01-05 DIAGNOSIS — Z87891 Personal history of nicotine dependence: Secondary | ICD-10-CM | POA: Insufficient documentation

## 2017-01-05 DIAGNOSIS — N3000 Acute cystitis without hematuria: Secondary | ICD-10-CM | POA: Insufficient documentation

## 2017-01-05 DIAGNOSIS — R300 Dysuria: Secondary | ICD-10-CM

## 2017-01-05 LAB — CBC
HEMATOCRIT: 43 % (ref 36.0–46.0)
Hemoglobin: 14.3 g/dL (ref 12.0–15.0)
MCH: 29.6 pg (ref 26.0–34.0)
MCHC: 33.3 g/dL (ref 30.0–36.0)
MCV: 89 fL (ref 78.0–100.0)
Platelets: 274 10*3/uL (ref 150–400)
RBC: 4.83 MIL/uL (ref 3.87–5.11)
RDW: 14 % (ref 11.5–15.5)
WBC: 6.8 10*3/uL (ref 4.0–10.5)

## 2017-01-05 LAB — URINALYSIS, ROUTINE W REFLEX MICROSCOPIC
BILIRUBIN URINE: NEGATIVE
Glucose, UA: NEGATIVE mg/dL
Hgb urine dipstick: NEGATIVE
KETONES UR: NEGATIVE mg/dL
NITRITE: NEGATIVE
PROTEIN: NEGATIVE mg/dL
SPECIFIC GRAVITY, URINE: 1.003 — AB (ref 1.005–1.030)
pH: 7 (ref 5.0–8.0)

## 2017-01-05 LAB — BASIC METABOLIC PANEL
Anion gap: 9 (ref 5–15)
CHLORIDE: 106 mmol/L (ref 101–111)
CO2: 22 mmol/L (ref 22–32)
Calcium: 9.2 mg/dL (ref 8.9–10.3)
Creatinine, Ser: 0.68 mg/dL (ref 0.44–1.00)
GFR calc Af Amer: >60 mL/min (ref >60)
GFR calc non Af Amer: >60 mL/min (ref >60)
GLUCOSE: 97 mg/dL (ref 65–99)
POTASSIUM: 4.1 mmol/L (ref 3.5–5.1)
SODIUM: 137 mmol/L (ref 135–145)

## 2017-01-05 LAB — POC URINE PREG, ED: Preg Test, Ur: NEGATIVE

## 2017-01-05 MED ORDER — CEPHALEXIN 250 MG PO CAPS
500.0000 mg | ORAL_CAPSULE | Freq: Once | ORAL | Status: AC
  Administered 2017-01-05: 500 mg via ORAL
  Filled 2017-01-05: qty 2

## 2017-01-05 MED ORDER — KETOROLAC TROMETHAMINE 30 MG/ML IJ SOLN
15.0000 mg | Freq: Once | INTRAMUSCULAR | Status: AC
Start: 2017-01-05 — End: 2017-01-05
  Administered 2017-01-05: 15 mg via INTRAMUSCULAR

## 2017-01-05 MED ORDER — CEPHALEXIN 500 MG PO CAPS
500.0000 mg | ORAL_CAPSULE | Freq: Two times a day (BID) | ORAL | 0 refills | Status: AC
Start: 2017-01-05 — End: 2017-01-12

## 2017-01-05 MED ORDER — KETOROLAC TROMETHAMINE 30 MG/ML IJ SOLN
15.0000 mg | Freq: Once | INTRAMUSCULAR | Status: DC
  Filled 2017-01-05: qty 1

## 2017-01-05 NOTE — ED Notes (Signed)
Pt states [facility] also [facility] at [facility].

## 2017-01-05 NOTE — ED Notes (Signed)
Pt reports UTI X10 days, states she was not diagnosed and was not given antibiotics because her urine was clean at urgent care. [facility] gave her 3 days of Cipro. Pt states she took them but never got better. Starting last night pt reports pain in sides and fever.

## 2017-01-05 NOTE — ED Provider Notes (Signed)
[facility]-EMERGENCY DEPT Provider Note  CSN: [id number] Arrival date & time: 01/05/17  1519  History   Chief Complaint Chief Complaint  Patient presents with  . Flank Pain   HPI Elaine [name] is a 36 y.o. female.  The history is provided by the patient and medical records. No language interpreter was used.  Illness  This is a new problem. The current episode started more than 1 week ago. The problem occurs constantly. The problem has not changed since onset.Pertinent negatives include no chest pain, no abdominal pain, no headaches and no shortness of breath. Nothing aggravates the symptoms. Nothing relieves the symptoms.    Past Medical History:  Diagnosis Date  . Anorexia   . Anxiety   . Dysrhythmia   . Gall stones   . Headache   . Hypermobility of joint    Ehler Danlos Syndrome  . Infection    bladder  . Jaundice   . Mastoiditis   . Migraine   . Morton neuroma   . Ovarian cyst   . Pancreatitis   . Psoriasis   . Psoriasis   . Scoliosis    There are no active problems to display for this patient.  Past Surgical History:  Procedure Laterality Date  . DILATION AND CURETTAGE OF UTERUS  2002  . LAPAROSCOPIC CHOLECYSTECTOMY  2006   OB History    Gravida Para Term Preterm AB Living   1       1     SAB TAB Ectopic Multiple Live Births   1              Home Medications    Prior to Admission medications   Medication Sig Start Date End Date Taking? Authorizing Provider  cephALEXin (KEFLEX) 500 MG capsule Take 1 capsule (500 mg total) by mouth 4 (four) times daily. 10/23/16   Hayden Rasmussen, NP  cetirizine (ZYRTEC) 10 MG tablet Take 10 mg by mouth daily.    Historical Provider, MD  clonazePAM (KLONOPIN) 1 MG tablet Take 1 mg by mouth 2 (two) times daily as needed for anxiety.     Historical Provider, MD  fluconazole (DIFLUCAN) 150 MG tablet 1 tab po x 1. May repeat in 72 hours if no improvement 10/23/16   Hayden Rasmussen, NP  fluticasone (CUTIVATE) 0.05 % cream Apply topically 2  (two) times daily. 06/12/16   Linna Hoff, MD  furosemide (LASIX) 40 MG tablet Take 1 tablet (40 mg total) by mouth daily. 06/12/16   Linna Hoff, MD  ibuprofen (ADVIL,MOTRIN) 200 MG tablet Take 200-400 mg by mouth every 6 (six) hours as needed for fever or moderate pain.    Historical Provider, MD  Melatonin 10 MG CAPS Take 10 mg by mouth at bedtime.     Historical Provider, MD  phenazopyridine (PYRIDIUM) 200 MG tablet Take 1 tablet (200 mg total) by mouth 3 (three) times daily. 12/30/16   Junius Finner, PA-C   Family History Family History  Problem Relation Age of Onset  . Heart disease Father   . Cancer Father     skin  . Osteoarthritis Father    Social History Social History  Substance Use Topics  . Smoking status: Former Smoker    Packs/day: 1.00    Types: Cigarettes  . Smokeless tobacco: Never Used  . Alcohol use No    Allergies   Sulfa antibiotics and Morphine and related  Review of Systems Review of Systems  Constitutional: Positive for chills.  Respiratory: Negative  for shortness of breath.   Cardiovascular: Negative for chest pain.  Gastrointestinal: Negative for abdominal pain.  Neurological: Negative for headaches.  All other systems reviewed and are negative.   Physical Exam Updated Vital Signs BP 130/98   Pulse 86   Temp 99.4 F (37.4 C) (Oral)   Resp 16   LMP 12/24/2016 (Approximate)   SpO2 98%   Physical Exam  Constitutional: She is oriented to person, place, and time. No distress.  Overweight young female  HENT:  Head: Normocephalic and atraumatic.  Eyes: EOM are normal. Pupils are equal, round, and reactive to light.  Neck: Normal range of motion. Neck supple.  Cardiovascular: Normal rate, regular rhythm and normal heart sounds.   Pulmonary/Chest: Effort normal and breath sounds normal.  Abdominal: Soft. Bowel sounds are normal. She exhibits no distension. There is tenderness (mild, suprapubic). There is no rebound and no guarding.    Musculoskeletal: Normal range of motion.  Neurological: She is alert and oriented to person, place, and time.  Skin: Skin is warm and dry. Capillary refill takes less than 2 seconds. She is not diaphoretic.  Nursing note and vitals reviewed.   ED Treatments / Results  Labs (all labs ordered are listed, but only abnormal results are displayed) Labs Reviewed  BASIC METABOLIC PANEL - Abnormal; Notable for the following:       Result Value   BUN <5 (*)    All other components within normal limits  URINALYSIS, ROUTINE W REFLEX MICROSCOPIC - Abnormal; Notable for the following:    Color, Urine STRAW (*)    Specific Gravity, Urine 1.003 (*)    Leukocytes, UA TRACE (*)    Bacteria, UA RARE (*)    Squamous Epithelial / LPF 0-5 (*)    All other components within normal limits  URINE CULTURE  CBC  POC URINE PREG, ED   EKG  EKG Interpretation None      Radiology No results found.  Procedures Procedures (including critical care time)  Medications Ordered in ED Medications - No data to display  Initial Impression / Assessment and Plan / ED Course  I have reviewed the triage vital signs and the nursing notes.  36 y.o. female with above stated PMHx, HPI, and physical. Symptoms onset 10 days you. Dysuria, urinary frequency, urinary urgency, cloudy urine appearance. Patient now with chills, nausea, flank pain. Patient denies headache, bodyaches, cough, vaginal bleeding, vaginal discharge, vaginal itching. Patient married with one sexual partner 2 years. No history of STDs. Took Cipro x3 days without relief.  UA showing trace leuks and rare bacteria. Given the above symptoms, will treat patient with course of keflex (given in past with success) for acute cystitis.  Laboratory results were personally reviewed by myself and used in the medical decision making of this patient's treatment and disposition.  Pt discharged home in stable condition. Strict ED return precautions dicussed. Pt  understands and agrees with the plan and has no further questions or concerns.   Pt care discussed with and followed by my attending, Dr. Gerhard Munchobert [name]  Elaine Okyan Atlee Kluth, MD Pager [phone]  Final Clinical Impressions(s) / ED Diagnoses   Final diagnoses:  Acute cystitis without hematuria  Dysuria  Urinary frequency  Urinary urgency   New Prescriptions New Prescriptions   No medications on file     Elaine Okyan Gracelin Weisberg, MD 01/05/17 2055    Elaine Munchobert Lockwood, MD 01/06/17 [id number]

## 2017-01-05 NOTE — ED Triage Notes (Signed)
Pt reports hx of kidney infection. Recent UTI treated with cipro with no improvement. Pt now reports running a fever at home with flank pain.

## 2017-01-07 LAB — URINE CULTURE

## 2017-01-14 ENCOUNTER — Encounter (HOSPITAL_COMMUNITY): Payer: Self-pay | Admitting: Emergency Medicine

## 2017-01-14 ENCOUNTER — Emergency Department (HOSPITAL_COMMUNITY)
Admission: EM | Admit: 2017-01-14 | Discharge: 2017-01-15 | Disposition: A | Discharge status: Home / Self Care | Payer: Self-pay | Attending: Emergency Medicine | Admitting: Emergency Medicine

## 2017-01-14 DIAGNOSIS — R109 Unspecified abdominal pain: Secondary | ICD-10-CM | POA: Insufficient documentation

## 2017-01-14 DIAGNOSIS — M549 Dorsalgia, unspecified: Secondary | ICD-10-CM

## 2017-01-14 DIAGNOSIS — M545 Low back pain: Secondary | ICD-10-CM | POA: Insufficient documentation

## 2017-01-14 DIAGNOSIS — Z87891 Personal history of nicotine dependence: Secondary | ICD-10-CM | POA: Insufficient documentation

## 2017-01-14 LAB — CBC WITH DIFFERENTIAL/PLATELET
BASOS ABS: 0 10*3/uL (ref 0.0–0.1)
BASOS PCT: 1 %
EOS ABS: 0.2 10*3/uL (ref 0.0–0.7)
EOS PCT: 3 %
HCT: 40.7 % (ref 36.0–46.0)
Hemoglobin: 13.5 g/dL (ref 12.0–15.0)
LYMPHS ABS: 1.6 10*3/uL (ref 0.7–4.0)
Lymphocytes Relative: 24 %
MCH: 29.2 pg (ref 26.0–34.0)
MCHC: 33.2 g/dL (ref 30.0–36.0)
MCV: 87.9 fL (ref 78.0–100.0)
Monocytes Absolute: 0.4 10*3/uL (ref 0.1–1.0)
Monocytes Relative: 5 %
Neutro Abs: 4.4 10*3/uL (ref 1.7–7.7)
Neutrophils Relative %: 67 %
PLATELETS: 257 10*3/uL (ref 150–400)
RBC: 4.63 MIL/uL (ref 3.87–5.11)
RDW: 14 % (ref 11.5–15.5)
WBC: 6.6 10*3/uL (ref 4.0–10.5)

## 2017-01-14 LAB — URINALYSIS, ROUTINE W REFLEX MICROSCOPIC
Bilirubin Urine: NEGATIVE
Glucose, UA: NEGATIVE mg/dL
HGB URINE DIPSTICK: NEGATIVE
Ketones, ur: NEGATIVE mg/dL
Leukocytes, UA: NEGATIVE
NITRITE: NEGATIVE
PROTEIN: NEGATIVE mg/dL
Specific Gravity, Urine: 1.001 — ABNORMAL LOW (ref 1.005–1.030)
pH: 7 (ref 5.0–8.0)

## 2017-01-14 LAB — COMPREHENSIVE METABOLIC PANEL
ALK PHOS: 50 U/L (ref 38–126)
ALT: 11 U/L — AB (ref 14–54)
ANION GAP: 9 (ref 5–15)
AST: 16 U/L (ref 15–41)
Albumin: 3.6 g/dL (ref 3.5–5.0)
BILIRUBIN TOTAL: 0.8 mg/dL (ref 0.3–1.2)
BUN: 6 mg/dL (ref 6–20)
CALCIUM: 8.9 mg/dL (ref 8.9–10.3)
CO2: 25 mmol/L (ref 22–32)
CREATININE: 0.71 mg/dL (ref 0.44–1.00)
Chloride: 103 mmol/L (ref 101–111)
Glucose, Bld: 97 mg/dL (ref 65–99)
Potassium: 3.6 mmol/L (ref 3.5–5.1)
Sodium: 137 mmol/L (ref 135–145)
TOTAL PROTEIN: 6.8 g/dL (ref 6.5–8.1)

## 2017-01-14 LAB — I-STAT CG4 LACTIC ACID, ED: LACTIC ACID, VENOUS: 0.75 mmol/L (ref 0.5–1.9)

## 2017-01-14 MED ORDER — SODIUM CHLORIDE 0.9 % IV BOLUS (SEPSIS)
1000.0000 mL | Freq: Once | INTRAVENOUS | Status: AC
  Administered 2017-01-15: 1000 mL via INTRAVENOUS

## 2017-01-14 MED ORDER — ACETAMINOPHEN 325 MG PO TABS
650.0000 mg | ORAL_TABLET | Freq: Once | ORAL | Status: AC
  Administered 2017-01-15: 650 mg via ORAL
  Filled 2017-01-14: qty 2

## 2017-01-14 NOTE — ED Provider Notes (Signed)
[facility]EMER[facility]ENCY DEPT Provider Note   CSN: [id number] Arrival date & time: 01/14/17  1719   History   Chief Complaint Chief Complaint  Patient presents with  . Back Pain  . Fever    HPI Elaine DillonSarah L [name] is a 36 y.o. female.  HPI   36 year old presents today with complaints of back pain and fever. Patient notes that approximately 2 weeks ago she started to develop dysuria typical of previous urinary tract infection. Patient notes that she was seen at urgent care and had no signs of urinary tract infection. Urine culture sent. She was started on Pyridium. Patient notes following up and being started on Levaquin. Patient notes symptoms mildly improved, was seen again with bilateral flank and back pain and started on Keflex (01/05/17). Patient notes no symptomatic improvement, reports pain to her mid low back, worse with palpation, worse with movement. She reports radiation of symptoms into the bilateral upper and lower abdominal regions. Patient notes today she started to have a fever with her associated burning with urination. She denies any vaginal bleeding or discharge. She reports very minor upper respiratory complaints, no other infectious etiology. Patient is status post cholecystectomy   Past Medical History:  Diagnosis Date  . Anorexia   . Anxiety   . Dysrhythmia   . Gall stones   . Headache   . Hypermobility of joint    Ehler Danlos Syndrome  . Infection    bladder  . Jaundice   . Mastoiditis   . Migraine   . Morton neuroma   . Ovarian cyst   . Pancreatitis   . Psoriasis   . Psoriasis   . Scoliosis     There are no active problems to display for this patient.   Past Surgical History:  Procedure Laterality Date  . DILATION AND CURETTAGE OF UTERUS  2002  . LAPAROSCOPIC CHOLECYSTECTOMY  2006    OB History    Gravida Para Term Preterm AB Living   1       1     SAB TAB Ectopic Multiple Live Births   1               Home Medications    Prior to Admission  medications   Medication Sig Start Date End Date Taking? Authorizing Provider  cetirizine (ZYRTEC) 10 MG tablet Take 10 mg by mouth daily.   Yes Historical Provider, MD  ibuprofen (ADVIL,MOTRIN) 200 MG tablet Take 200-400 mg by mouth every 6 (six) hours as needed for fever or moderate pain.   Yes Historical Provider, MD  Melatonin 10 MG CAPS Take 10 mg by mouth at bedtime.    Yes Historical Provider, MD  OVER THE COUNTER MEDICATION Take 1,000 mg by mouth daily. demanos   Yes Historical Provider, MD  phenazopyridine (PYRIDIUM) 200 MG tablet Take 1 tablet (200 mg total) by mouth 3 (three) times daily. Patient taking differently: Take 200 mg by mouth 3 (three) times daily as needed for pain.  12/30/16  Yes Junius FinnerErin O'Malley, PA-C    Family History Family History  Problem Relation Age of Onset  . Heart disease Father   . Cancer Father     skin  . Osteoarthritis Father     Social History Social History  Substance Use Topics  . Smoking status: Former Smoker    Packs/day: 1.00    Types: Cigarettes  . Smokeless tobacco: Never Used  . Alcohol use No     Allergies   Sulfa antibiotics and  Morphine and related   Review of Systems Review of Systems  All other systems reviewed and are negative.    Physical Exam Updated Vital Signs BP 121/80 (BP Location: Right Arm)   Pulse 89   Temp 98.8 F (37.1 C) (Oral)   Resp 17   Ht 5' 8.5" (1.74 m)   Wt 113.4 kg   LMP 12/24/2016 (Approximate)   SpO2 100%   BMI 37.46 kg/m   Physical Exam  Constitutional: She is oriented to person, place, and time. She appears well-developed and well-nourished.  HENT:  Head: Normocephalic and atraumatic.  Eyes: Conjunctivae are normal. Pupils are equal, round, and reactive to light. Right eye exhibits no discharge. Left eye exhibits no discharge. No scleral icterus.  Neck: Normal range of motion. No JVD present. No tracheal deviation present.  Pulmonary/Chest: Effort normal. No stridor.  Abdominal:   Very minor tenderness to palpation of the left upper abdomen  Musculoskeletal:  Tenderness to palpation of the bilateral upper lumbar and lower thoracic soft tissue. No CVA tenderness. No midline TTP or signs of infection   Neurological: She is alert and oriented to person, place, and time. Coordination normal.  Psychiatric: She has a normal mood and affect. Her behavior is normal. Judgment and thought content normal.  Nursing note and vitals reviewed.    ED Treatments / Results  Labs (all labs ordered are listed, but only abnormal results are displayed) Labs Reviewed  COMPREHENSIVE METABOLIC PANEL - Abnormal; Notable for the following:       Result Value   ALT 11 (*)    All other components within normal limits  URINALYSIS, ROUTINE W REFLEX MICROSCOPIC - Abnormal; Notable for the following:    Color, Urine COLORLESS (*)    Specific Gravity, Urine 1.001 (*)    All other components within normal limits  URINE CULTURE  CBC WITH DIFFERENTIAL/PLATELET  LIPASE, BLOOD  I-STAT CG4 LACTIC ACID, ED  I-STAT CG4 LACTIC ACID, ED  POC URINE PREG, ED    EKG  EKG Interpretation None       Radiology Ct Abdomen Pelvis W Contrast  Result Date: 01/15/2017 CLINICAL DATA:  Abdominal and back pain for 9 days EXAM: CT ABDOMEN AND PELVIS WITH CONTRAST TECHNIQUE: Multidetector CT imaging of the abdomen and pelvis was performed using the standard protocol following bolus administration of intravenous contrast. CONTRAST:  ISOVUE-300 IOPAMIDOL (ISOVUE-300) INJECTION 61% COMPARISON:  12/05/2015 FINDINGS: Lower chest: Mild lingular atelectasis. Lung bases are otherwise clear. Normal heart size. Hepatobiliary: Subcentimeter hypodense lesion anterior dome of the liver unchanged. Surgical clips in the gallbladder fossa. No biliary dilatation. Pancreas: Unremarkable. No pancreatic ductal dilatation or surrounding inflammatory changes. Spleen: Normal in size without focal abnormality. Adrenals/Urinary  Tract: Adrenal glands are unremarkable. Kidneys are normal, without renal calculi, focal lesion, or hydronephrosis. Bladder is unremarkable. Stomach/Bowel: Stomach is within normal limits. Appendix appears normal. No evidence of bowel wall thickening, distention, or inflammatory changes. Vascular/Lymphatic: No significant vascular findings are present. No enlarged abdominal or pelvic lymph nodes. Reproductive: Possible 1.6 cm rim enhancing cyst in the left ovary. Uterus unremarkable. Small free fluid in the left adnexa and pelvis Other: No free air. Musculoskeletal: No acute or significant osseous findings. IMPRESSION: No CT evidence for acute intra-abdominal pathology. Trace free fluid in the pelvis and left adnexal with 1.6 cm rim enhancing cyst in the left ovary Electronically Signed   By: Jasmine Pang M.D.   On: 01/15/2017 02:13    Procedures Procedures (including critical care  time)  Medications Ordered in ED Medications  sodium chloride 0.9 % bolus 1,000 mL (0 mLs Intravenous Stopped 01/15/17 0257)  acetaminophen (TYLENOL) tablet 650 mg (650 mg Oral Given 01/15/17 0008)  iopamidol (ISOVUE-300) 61 % injection (100 mLs Intravenous Contrast Given 01/15/17 0150)     Initial Impression / Assessment and Plan / ED Course  I have reviewed the triage vital signs and the nursing notes.  Pertinent labs & imaging results that were available during my care of the patient were reviewed by me and considered in my medical decision making (see chart for details).      Final Clinical Impressions(s) / ED Diagnoses   Final diagnoses:  Back pain   Labs: Urine culture, urinalysis, i-STAT lactic acid, CMP, CBC  Imaging: CT abdomen and pelvis.  Consults:  Therapeutics:  Discharge Meds:   Assessment/Plan:  36 year old female presents today with complaints of back pain and fever. Patient also has very minor left upper quadrant abdominal tenderness. Patient is afebrile here with no antipyretics prior to  arrival. Patient has what appears to be tenderness to the musculature and soft tissue of back, no midline tenderness. She has no elevation in white blood cells. Patient was complaining of left upper quadrant pain. Her CT shows no significant findings other than ovarian cyst which she was informed of. Patient very well appearing in no acute distress with no significant findings today. Have low suspicion for significant infectious etiology including epidural abscess or infection. Patient has no signs of urinary tract infection, she has no lower abdominal or pelvic pain, no vaginal discharge, no significant findings that require further evaluation or management here in the ED setting. She'll be discharged home with primary care follow-up, and strict return precautions. She verbalized understanding and agreement to today's plan had no further questions or concerns at the time discharge.   New Prescriptions New Prescriptions   No medications on file     Eyvonne Mechanic, PA-C 01/15/17 0250    Eyvonne Mechanic, PA-C 01/15/17 0251    Eyvonne Mechanic, PA-C 01/15/17 0303    Layla Maw Ward, DO 01/15/17 1610

## 2017-01-14 NOTE — ED Triage Notes (Addendum)
PT REP[ORT SEEN HERE sEVERAL TIMES FOR KIDNEY INFECTION and finished her meds but still having back pain, states has no $ to see dr

## 2017-01-15 ENCOUNTER — Emergency Department (HOSPITAL_COMMUNITY): Payer: Self-pay

## 2017-01-15 LAB — POC URINE PREG, ED: PREG TEST UR: NEGATIVE

## 2017-01-15 LAB — LIPASE, BLOOD: Lipase: 22 U/L (ref 11–51)

## 2017-01-15 MED ORDER — IOPAMIDOL (ISOVUE-300) INJECTION 61%
INTRAVENOUS | Status: AC
  Administered 2017-01-15: 100 mL via INTRAVENOUS
  Filled 2017-01-15: qty 100

## 2017-01-15 NOTE — ED Notes (Signed)
Pt verbalized understanding discharge instructions and denies any further needs or questions at this time. VS stable, ambulatory and steady gait.   

## 2017-01-15 NOTE — Discharge Instructions (Signed)
Please read attached information. If you experience any new or worsening signs or symptoms please return to the emergency room for evaluation. Please follow-up with your primary care provider or specialist as discussed.  °

## 2017-01-16 LAB — URINE CULTURE: CULTURE: NO GROWTH

## 2017-03-04 ENCOUNTER — Emergency Department (HOSPITAL_COMMUNITY): Payer: Self-pay

## 2017-03-04 ENCOUNTER — Encounter (HOSPITAL_COMMUNITY): Payer: Self-pay | Admitting: *Deleted

## 2017-03-04 ENCOUNTER — Emergency Department (HOSPITAL_COMMUNITY)
Admission: EM | Admit: 2017-03-04 | Discharge: 2017-03-05 | Disposition: A | Discharge status: Home / Self Care | Payer: Self-pay | Attending: Emergency Medicine | Admitting: Emergency Medicine

## 2017-03-04 DIAGNOSIS — R1013 Epigastric pain: Secondary | ICD-10-CM | POA: Insufficient documentation

## 2017-03-04 DIAGNOSIS — R1011 Right upper quadrant pain: Secondary | ICD-10-CM

## 2017-03-04 DIAGNOSIS — Z87891 Personal history of nicotine dependence: Secondary | ICD-10-CM | POA: Insufficient documentation

## 2017-03-04 HISTORY — DX: Irritable bowel syndrome, unspecified: K58.9

## 2017-03-04 LAB — COMPREHENSIVE METABOLIC PANEL
ALBUMIN: 3.6 g/dL (ref 3.5–5.0)
ALT: 11 U/L — ABNORMAL LOW (ref 14–54)
AST: 16 U/L (ref 15–41)
Alkaline Phosphatase: 60 U/L (ref 38–126)
Anion gap: 10 (ref 5–15)
BILIRUBIN TOTAL: 0.5 mg/dL (ref 0.3–1.2)
BUN: <5 mg/dL — ABNORMAL LOW (ref 6–20)
CHLORIDE: 105 mmol/L (ref 101–111)
CO2: 26 mmol/L (ref 22–32)
Calcium: 9.3 mg/dL (ref 8.9–10.3)
Creatinine, Ser: 0.63 mg/dL (ref 0.44–1.00)
GFR calc Af Amer: >60 mL/min (ref >60)
GFR calc non Af Amer: >60 mL/min (ref >60)
GLUCOSE: 106 mg/dL — AB (ref 65–99)
POTASSIUM: 3.7 mmol/L (ref 3.5–5.1)
SODIUM: 141 mmol/L (ref 135–145)
TOTAL PROTEIN: 7 g/dL (ref 6.5–8.1)

## 2017-03-04 LAB — I-STAT TROPONIN, ED
TROPONIN I, POC: 0 ng/mL (ref 0.00–0.08)
Troponin i, poc: 0 ng/mL (ref 0.00–0.08)

## 2017-03-04 LAB — CBC
HEMATOCRIT: 40.1 % (ref 36.0–46.0)
HEMOGLOBIN: 13.2 g/dL (ref 12.0–15.0)
MCH: 29.5 pg (ref 26.0–34.0)
MCHC: 32.9 g/dL (ref 30.0–36.0)
MCV: 89.5 fL (ref 78.0–100.0)
Platelets: 269 10*3/uL (ref 150–400)
RBC: 4.48 MIL/uL (ref 3.87–5.11)
RDW: 13.8 % (ref 11.5–15.5)
WBC: 9.1 10*3/uL (ref 4.0–10.5)

## 2017-03-04 LAB — I-STAT BETA HCG BLOOD, ED (MC, WL, AP ONLY): I-stat hCG, quantitative: <5 m[IU]/mL (ref <5)

## 2017-03-04 LAB — LIPASE, BLOOD: LIPASE: 12 U/L (ref 11–51)

## 2017-03-04 MED ORDER — IOPAMIDOL (ISOVUE-370) INJECTION 76%
INTRAVENOUS | Status: AC
  Administered 2017-03-04: 100 mL
  Filled 2017-03-04: qty 100

## 2017-03-04 MED ORDER — SUCRALFATE 1 G PO TABS
1.0000 g | ORAL_TABLET | Freq: Once | ORAL | Status: DC
  Filled 2017-03-04: qty 1

## 2017-03-04 MED ORDER — OXYCODONE-ACETAMINOPHEN 5-325 MG PO TABS
ORAL_TABLET | ORAL | Status: AC
  Filled 2017-03-04: qty 1

## 2017-03-04 MED ORDER — SODIUM CHLORIDE 0.9 % IV BOLUS (SEPSIS)
1000.0000 mL | Freq: Once | INTRAVENOUS | Status: AC
  Administered 2017-03-04: 1000 mL via INTRAVENOUS

## 2017-03-04 MED ORDER — KETOROLAC TROMETHAMINE 30 MG/ML IJ SOLN
30.0000 mg | Freq: Once | INTRAMUSCULAR | Status: DC
  Filled 2017-03-04: qty 1

## 2017-03-04 MED ORDER — OXYCODONE-ACETAMINOPHEN 5-325 MG PO TABS
1.0000 | ORAL_TABLET | ORAL | Status: AC | PRN
  Administered 2017-03-04 (×2): 1 via ORAL
  Filled 2017-03-04: qty 1

## 2017-03-04 NOTE — ED Notes (Signed)
Patient transported to CT 

## 2017-03-04 NOTE — ED Notes (Signed)
IV attempted without success. 

## 2017-03-04 NOTE — ED Provider Notes (Signed)
[facility] Provider Note   CSN: [id number] Arrival date & time: 03/04/17  1614 By signing my name below, I, Levon Hedger, attest that this documentation has been prepared under the direction and in the presence of non-physician practitioner, Shanna Cisco, PA-C. Electronically Signed: Levon Hedger, Scribe. 03/04/2017. 8:21 PM.   History   Chief Complaint Chief Complaint  Patient presents with  . Back Pain  . Abdominal Pain   HPI Elaine [name] is a 36 y.o. female with a PMHx of Ehler Danlos syndrome,pancreatitis, IBS and prior cholecystectomy who presents to the Emergency Department complaining of gradually worsening, intermittent epigastric pain with radiation to her back and chest onset ten days ago. Per pt, this pain began as moderate, intermittent cramping and significantly worsened yesterday to severe, shooting, stabbing pain. She states that her epigastric pain is exacerbated by swallowing; no alleviating factors noted.  She reports associated chest tightness, difficulty swallowing, and subjective fever. She has taken omeprazole, Tylenol and antacids with no relief. Per pt, this does not feel like her usual GERD and feels similar to prior episodes of pancreatitis. She also reports diarrhea which pt states is baseline for her due to IBS. Pt is not currently followed by a gastroenterologist due to lack of insurance and financial concerns. She denies any cough, congestion, sore throat, SOB, melena, hematochezia, nausea or vomiting.    The history is provided by the patient. No language interpreter was used.   Past Medical History:  Diagnosis Date  . Anorexia   . Anxiety   . Dysrhythmia   . Gall stones   . Headache   . Hypermobility of joint    Ehler Danlos Syndrome  . IBS (irritable bowel syndrome)   . Infection    bladder  . Jaundice   . Mastoiditis   . Migraine   . Morton neuroma   . Ovarian cyst   . Pancreatitis   . Psoriasis   . Psoriasis   . Scoliosis    There  are no active problems to display for this patient.  Past Surgical History:  Procedure Laterality Date  . DILATION AND CURETTAGE OF UTERUS  2002  . LAPAROSCOPIC CHOLECYSTECTOMY  2006    OB History    Gravida Para Term Preterm AB Living   1       1     SAB TAB Ectopic Multiple Live Births   1              Home Medications    Prior to Admission medications   Medication Sig Start Date End Date Taking? Authorizing Provider  budesonide (RHINOCORT ALLERGY) 32 MCG/ACT nasal spray Place 1 spray into both nostrils at bedtime.   Yes Historical Provider, MD  cetirizine (ZYRTEC) 10 MG tablet Take 10 mg by mouth daily.   Yes Historical Provider, MD  Melatonin 10 MG CAPS Take 10 mg by mouth at bedtime.    Yes Historical Provider, MD  phenazopyridine (PYRIDIUM) 200 MG tablet Take 1 tablet (200 mg total) by mouth 3 (three) times daily. Patient not taking: Reported on 03/05/2017 12/30/16   Junius Finner, PA-C  sucralfate (CARAFATE) 1 g tablet Take 1 tablet (1 g total) by mouth 4 (four) times daily -  with meals and at bedtime. 03/05/17   Chase Picket Ward, PA-C    Family History Family History  Problem Relation Age of Onset  . Heart disease Father   . Cancer Father     skin  . Osteoarthritis Father  Social History Social History  Substance Use Topics  . Smoking status: Former Smoker    Packs/day: 1.00    Types: Cigarettes  . Smokeless tobacco: Never Used  . Alcohol use No    Allergies   Sulfa antibiotics and Morphine and related   Review of Systems Review of Systems  Constitutional: Positive for fever.  HENT: Positive for trouble swallowing. Negative for congestion and sore throat.   Respiratory: Positive for chest tightness. Negative for cough and shortness of breath.   Gastrointestinal: Positive for abdominal pain. Negative for blood in stool.  Musculoskeletal: Positive for back pain.  All other systems reviewed and are negative.   Physical Exam Updated Vital Signs BP  (!) 137/93   Pulse 83   Temp 99.5 F (37.5 C) (Oral)   Resp 18   Ht 5' 8.5" (1.74 m)   Wt 113.4 kg   LMP 02/06/2017   SpO2 96%   BMI 37.46 kg/m   Physical Exam  Constitutional: She appears well-developed and well-nourished. No distress.  Nontoxic appearing.   HENT:  Head: Normocephalic and atraumatic.  Mouth/Throat: Oropharynx is clear and moist.  Airway patent.   Neck: Neck supple.  Cardiovascular: Normal rate, regular rhythm and normal heart sounds.   No murmur heard. Pulmonary/Chest: Effort normal and breath sounds normal. No respiratory distress. She has no wheezes. She has no rales. She exhibits no tenderness.  Abdominal: Soft. Bowel sounds are normal. She exhibits no distension.  TTP of the epigastrium with guarding. No rebound tenderness.   Musculoskeletal: Normal range of motion.  Neurological: She is alert.  Skin: Skin is warm and dry.  Nursing note and vitals reviewed.  ED Treatments / Results  DIAGNOSTIC STUDIES:  Oxygen Saturation is 99% on RA, normal by my interpretation.    COORDINATION OF CARE:  7:55 PM Will order US abdomen limited RUQ. Discussed treatment plan with pt at bedside and pt agreed to plan.   Labs (all labs ordered are listed, but only abnormal results are displayed) Labs Reviewed  COMPREHENSIVE METABOLIC PANEL - Abnormal; Notable for the following:       Result Value   Glucose, Bld 106 (*)    BUN <5 (*)    ALT 11 (*)    All other components within normal limits  LIPASE, BLOOD  CBC  I-STAT BETA HCG BLOOD, ED ([facility], [facility], [facility] ONLY)  I-STAT TROPOININ, ED  I-STAT TROPOININ, ED    EKG  EKG Interpretation  Date/Time:  Monday March 04 2017 16:17:28 EDT Ventricular Rate:  107 PR Interval:  124 QRS Duration: 80 QT Interval:  344 QTC Calculation: 459 R Axis:   84 Text Interpretation:  Sinus tachycardia T wave abnormality, consider inferior ischemia Abnormal ECG No old tracing to compare TWI noted in inferior leads and V3, unclear if this  is old Confirmed by ISAACS MD, CAMERON [phone]) on 03/04/2017 8:26:12 PM       Radiology Dg Chest 2 View  Result Date: 03/04/2017 CLINICAL DATA:  Chest and low back pain for the past 3 days. Abdominal pain and headache for the past 10 days. History of cardiac dysrhythmia, former smoker, pancreatitis. EXAM: CHEST  2 VIEW COMPARISON:  PA and lateral chest x-ray of February 01, 2015 FINDINGS: The lungs are well-expanded. The lung markings are coarse in the retrocardiac region on the left but there is no discrete infiltrate. There is no pneumothorax or pleural effusion. The heart and pulmonary vascularity are normal. The mediastinum is normal in width. The  bony thorax exhibits no acute abnormality. IMPRESSION: Probable subsegmental atelectasis in the left lower lobe. No discrete pneumonia. Follow-up radiographs are recommended if the patient's symptoms persist or worsen. Electronically Signed   By: David  [place] M.D.   On: 03/04/2017 17:00   Ct Angio Chest/abd/pel For Dissection W And/or Wo Contrast  Result Date: 03/05/2017 CLINICAL DATA:  36 y/o F; history of Ehlers-Danlos syndrome presenting with epigastric pain. EXAM: CT ANGIOGRAPHY CHEST, ABDOMEN AND PELVIS TECHNIQUE: Multidetector CT imaging through the chest, abdomen and pelvis was performed using the standard protocol during bolus administration of intravenous contrast. Multiplanar reconstructed images and MIPs were obtained and reviewed to evaluate the vascular anatomy. CONTRAST:  100 cc Isovue 370 COMPARISON:  01/15/2017 CT of the abdomen and pelvis. FINDINGS: CTA CHEST FINDINGS Cardiovascular: Preferential opacification of the thoracic aorta. No evidence of thoracic aortic aneurysm or dissection. Normal heart size. No pericardial effusion. Mediastinum/Nodes: No enlarged mediastinal, hilar, or axillary lymph nodes. Thyroid gland, trachea, and esophagus demonstrate no significant findings. Lungs/Pleura: Lungs are clear. No pleural effusion or  pneumothorax. Musculoskeletal: Pectus excavatum, Haller index of 3.7. No acute or significant osseous findings. Review of the MIP images confirms the above findings. CTA ABDOMEN AND PELVIS FINDINGS VASCULAR Aorta: Normal caliber aorta without aneurysm, dissection, vasculitis or significant stenosis. Celiac: Patent without evidence of aneurysm, dissection, vasculitis or significant stenosis. SMA: Patent without evidence of aneurysm, dissection, vasculitis or significant stenosis. Renals: Both renal arteries are patent without evidence of aneurysm, dissection, vasculitis, fibromuscular dysplasia or significant stenosis. IMA: Patent without evidence of aneurysm, dissection, vasculitis or significant stenosis. Inflow: Patent without evidence of aneurysm, dissection, vasculitis or significant stenosis. Veins: No obvious venous abnormality within the limitations of this arterial phase study. Review of the MIP images confirms the above findings. NON-VASCULAR Hepatobiliary: No focal liver abnormality is seen. Status post cholecystectomy. No biliary dilatation. Pancreas: Unremarkable. No pancreatic ductal dilatation or surrounding inflammatory changes. Spleen: Normal in size without focal abnormality. Adrenals/Urinary Tract: Adrenal glands are unremarkable. Kidneys are normal, without renal calculi, focal lesion, or hydronephrosis. Bladder is unremarkable. Stomach/Bowel: Stomach is within normal limits. Appendix appears normal. No evidence of bowel wall thickening, distention, or inflammatory changes. Lymphatic: No significant vascular findings are present. No enlarged abdominal or pelvic lymph nodes. Reproductive: Uterus and bilateral adnexa are unremarkable. Other: No abdominal wall hernia or abnormality. No abdominopelvic ascites. Musculoskeletal: No fracture is seen. Mild S-shaped curvature of the spine. Review of the MIP images confirms the above findings. IMPRESSION: 1. No evidence of aortic dissection, aneurysm,  vasculitis, or significant stenosis. 2. No acute process of the chest, abdomen, or pelvis identified as explanation for pain. 3. Pectus excavatum, Haller index 3.7. 4. Mild S-shaped curvature of the spine. 5. Three tube shaped objects within the lumen of colon, 2 in the cecum, and 1 in the descending colon, measuring 12 mm in length and 7 mm in diameter, of uncertain significance, clinical correlation recommended. No bowel obstruction. Electronically Signed   By: Mitzi Hansen M.D.   On: 03/05/2017 00:00   US Abdomen Limited Ruq  Result Date: 03/04/2017 CLINICAL DATA:  Right upper quadrant pain EXAM: US ABDOMEN LIMITED - RIGHT UPPER QUADRANT COMPARISON:  None. FINDINGS: Gallbladder: Prior cholecystectomy. Common bile duct: Diameter: 3.6 mm Liver: No focal lesion identified. Within normal limits in parenchymal echogenicity. IMPRESSION: 1. Prior cholecystectomy. 2. No acute right upper quadrant abnormality. Electronically Signed   By: Elige Ko   On: 03/04/2017 21:28    Procedures Procedures (including critical care  time)  Medications Ordered in ED Medications  gi cocktail (Maalox,Lidocaine,Donnatal) (not administered)  HYDROcodone-acetaminophen (NORCO/VICODIN) 5-325 MG per tablet 1 tablet (not administered)  oxyCODONE-acetaminophen (PERCOCET/ROXICET) 5-325 MG per tablet 1 tablet (1 tablet Oral Given 03/04/17 2041)  sodium chloride 0.9 % bolus 1,000 mL (0 mLs Intravenous Stopped 03/04/17 2358)  iopamidol (ISOVUE-370) 76 % injection (100 mLs  Contrast Given 03/04/17 2253)  ketorolac (TORADOL) 30 MG/ML injection 30 mg (30 mg Intravenous Given 03/05/17 0021)  sucralfate (CARAFATE) tablet 1 g (1 g Oral Given 03/05/17 0021)    Initial Impression / Assessment and Plan / ED Course  I have reviewed the triage vital signs and the nursing notes.  Pertinent labs & imaging results that were available during my care of the patient were reviewed by me and considered in my medical decision making  (see chart for details).    Elaine [name] is a 36 y.o. female who presents to ED for epigastric abdominal pain x 10 days. On exam, patient is afebrile, hemodynamically stable with tenderness to palpation of her epigastrium - no peritoneal signs. She does have a history of Ehlers-Danlos syndrome. Given worsening sharp epigastric pain and hx of connective tissue disease, will proceed with CT angio to rule out dissection or PE as cause.   Labs reviewed and reassuring. Lipase negative.  CXR shows probably subsegmental atelectasis in the left lower lobe however no discrete infiltrate. Lungs are CTA bilaterally on exam. Patient with no cough, congestion, uri symptoms. Doubt PNA.  CT angio c/a/p negative for acute findings. CT angio does show three tube shaped objects within the lumen of the colon , 2 in the cecum and 1 in the descending colon. These do not correlate with area of pain on exam. Patient denies swallowing anything PO other than Tylenol capsules and omeprazole. Incidental finding which does not appear to be clinically relevant.  RUQ US negative.  Evaluation does not show pathology that would require ongoing emergent intervention or inpatient treatment. Patient is hemodynamically stable and mentating appropriately. Discussed findings and plan to follow up with GI with patient. Rx for Carafate given as this worked well in ED today. Continue omeprazole. Return precautions discussed and all questions answered.   Patient discussed with Dr. Erma HeritageIsaacs who agrees with treatment plan.    Final Clinical Impressions(s) / ED Diagnoses   Final diagnoses:  RUQ pain  Epigastric pain    New Prescriptions New Prescriptions   SUCRALFATE (CARAFATE) 1 G TABLET    Take 1 tablet (1 g total) by mouth 4 (four) times daily -  with meals and at bedtime.   I personally performed the services described in this documentation, which was scribed in my presence. The recorded information has been reviewed and is  accurate.      [facility] Coast Surgical CenterJaime Pilcher Ward, PA-C 03/05/17 0142    Shaune Pollackameron Isaacs, MD 03/05/17 [phone]

## 2017-03-04 NOTE — ED Notes (Addendum)
IV team at bedside 

## 2017-03-04 NOTE — ED Triage Notes (Signed)
Pt states 10 days ago epigastric pain.  Two days ago pain began radiating from between shoulder blades to center of chest.  Pain increased with eating.  Took omeprazole without improvement.  Hx of cholecystectomy.

## 2017-03-05 MED ORDER — KETOROLAC TROMETHAMINE 30 MG/ML IJ SOLN
30.0000 mg | Freq: Once | INTRAMUSCULAR | Status: AC
  Administered 2017-03-05: 30 mg via INTRAVENOUS
  Filled 2017-03-05: qty 1

## 2017-03-05 MED ORDER — GI COCKTAIL ~~LOC~~
30.0000 mL | Freq: Once | ORAL | Status: AC
  Administered 2017-03-05: 30 mL via ORAL
  Filled 2017-03-05: qty 30

## 2017-03-05 MED ORDER — SUCRALFATE 1 G PO TABS: 1.0000 g | ORAL_TABLET | Freq: Three times a day (TID) | ORAL | 0 refills | Status: DC

## 2017-03-05 MED ORDER — SUCRALFATE 1 G PO TABS
1.0000 g | ORAL_TABLET | Freq: Once | ORAL | Status: AC
  Administered 2017-03-05: 1 g via ORAL
  Filled 2017-03-05: qty 1

## 2017-03-05 MED ORDER — HYDROCODONE-ACETAMINOPHEN 5-325 MG PO TABS
1.0000 | ORAL_TABLET | Freq: Once | ORAL | Status: AC
Start: 2017-03-05 — End: 2017-03-05
  Administered 2017-03-05: 1 via ORAL
  Filled 2017-03-05: qty 1

## 2017-03-05 NOTE — Discharge Instructions (Signed)
Continue taking omeprazole daily. Carafate as directed.  Increase hydration.  Please call the GI clinic listed tomorrow to schedule a follow up appointment.   Please seek immediate care if you develop any of the following symptoms: The pain does not go away.  You have a fever.  You keep throwing up and can't keep fluids down.  You pass bloody or black tarry stools.  There is bright red blood in the stool.  There is rectal pain.  You do not seem to be getting better.  You have any questions or concerns.

## 2017-03-05 NOTE — ED Notes (Signed)
Patient Alert and oriented X4. Stable and ambulatory. Patient verbalized understanding of the discharge instructions.  Patient belongings were taken by the patient.  

## 2017-08-24 ENCOUNTER — Emergency Department (HOSPITAL_COMMUNITY): Payer: Self-pay

## 2017-08-24 ENCOUNTER — Encounter (HOSPITAL_COMMUNITY): Payer: Self-pay | Admitting: *Deleted

## 2017-08-24 ENCOUNTER — Emergency Department (HOSPITAL_COMMUNITY)
Admission: EM | Admit: 2017-08-24 | Discharge: 2017-08-24 | Disposition: A | Discharge status: Home / Self Care | Payer: Self-pay | Attending: Emergency Medicine | Admitting: Emergency Medicine

## 2017-08-24 DIAGNOSIS — R1013 Epigastric pain: Secondary | ICD-10-CM

## 2017-08-24 DIAGNOSIS — R509 Fever, unspecified: Secondary | ICD-10-CM

## 2017-08-24 DIAGNOSIS — Z87891 Personal history of nicotine dependence: Secondary | ICD-10-CM | POA: Insufficient documentation

## 2017-08-24 DIAGNOSIS — Z79899 Other long term (current) drug therapy: Secondary | ICD-10-CM | POA: Insufficient documentation

## 2017-08-24 LAB — URINALYSIS, ROUTINE W REFLEX MICROSCOPIC
Bilirubin Urine: NEGATIVE
GLUCOSE, UA: NEGATIVE mg/dL
HGB URINE DIPSTICK: NEGATIVE
Ketones, ur: 20 mg/dL — AB
LEUKOCYTES UA: NEGATIVE
Nitrite: NEGATIVE
PROTEIN: NEGATIVE mg/dL
SPECIFIC GRAVITY, URINE: 1.001 — AB (ref 1.005–1.030)
pH: 6 (ref 5.0–8.0)

## 2017-08-24 LAB — CBC WITH DIFFERENTIAL/PLATELET
Basophils Absolute: 0 10*3/uL (ref 0.0–0.1)
Basophils Relative: 0 %
EOS ABS: 0.3 10*3/uL (ref 0.0–0.7)
EOS PCT: 4 %
HCT: 38.7 % (ref 36.0–46.0)
Hemoglobin: 13 g/dL (ref 12.0–15.0)
LYMPHS ABS: 0.6 10*3/uL — AB (ref 0.7–4.0)
LYMPHS PCT: 7 %
MCH: 28.9 pg (ref 26.0–34.0)
MCHC: 33.6 g/dL (ref 30.0–36.0)
MCV: 86 fL (ref 78.0–100.0)
MONOS PCT: 6 %
Monocytes Absolute: 0.5 10*3/uL (ref 0.1–1.0)
Neutro Abs: 6.9 10*3/uL (ref 1.7–7.7)
Neutrophils Relative %: 83 %
PLATELETS: 224 10*3/uL (ref 150–400)
RBC: 4.5 MIL/uL (ref 3.87–5.11)
RDW: 14.4 % (ref 11.5–15.5)
WBC: 8.3 10*3/uL (ref 4.0–10.5)

## 2017-08-24 LAB — COMPREHENSIVE METABOLIC PANEL
ALBUMIN: 3.4 g/dL — AB (ref 3.5–5.0)
ALT: 9 U/L — AB (ref 14–54)
AST: 14 U/L — AB (ref 15–41)
Alkaline Phosphatase: 49 U/L (ref 38–126)
Anion gap: 7 (ref 5–15)
CHLORIDE: 106 mmol/L (ref 101–111)
CO2: 22 mmol/L (ref 22–32)
CREATININE: 0.67 mg/dL (ref 0.44–1.00)
Calcium: 8.7 mg/dL — ABNORMAL LOW (ref 8.9–10.3)
GFR calc Af Amer: >60 mL/min (ref >60)
Glucose, Bld: 100 mg/dL — ABNORMAL HIGH (ref 65–99)
POTASSIUM: 3.3 mmol/L — AB (ref 3.5–5.1)
Sodium: 135 mmol/L (ref 135–145)
Total Bilirubin: 1 mg/dL (ref 0.3–1.2)
Total Protein: 6.6 g/dL (ref 6.5–8.1)

## 2017-08-24 LAB — PREGNANCY, URINE: PREG TEST UR: NEGATIVE

## 2017-08-24 LAB — LIPASE, BLOOD: Lipase: 24 U/L (ref 11–51)

## 2017-08-24 MED ORDER — DICYCLOMINE HCL 20 MG PO TABS: 20.0000 mg | ORAL_TABLET | Freq: Two times a day (BID) | ORAL | 0 refills | Status: DC | PRN

## 2017-08-24 MED ORDER — IOPAMIDOL (ISOVUE-300) INJECTION 61%
INTRAVENOUS | Status: AC
  Administered 2017-08-24: 100 mL
  Filled 2017-08-24: qty 100

## 2017-08-24 MED ORDER — ACETAMINOPHEN 500 MG PO TABS
1000.0000 mg | ORAL_TABLET | Freq: Once | ORAL | Status: AC
  Administered 2017-08-24: 1000 mg via ORAL
  Filled 2017-08-24: qty 2

## 2017-08-24 MED ORDER — SODIUM CHLORIDE 0.9 % IV BOLUS (SEPSIS)
1000.0000 mL | Freq: Once | INTRAVENOUS | Status: AC
  Administered 2017-08-24: 1000 mL via INTRAVENOUS

## 2017-08-24 NOTE — ED Provider Notes (Signed)
[facility] Provider Note   CSN: [id number] Arrival date & time: 08/24/17  0501     History   Chief Complaint Chief Complaint  Patient presents with  . Generalized Body Aches    HPI Elaine [name] is a 36 y.o. female.  The history is provided by the patient and medical records. No language interpreter was used.   Elaine [name] is a 36 y.o. female  with a PMH of pancreatitis, IBS who presents to the Emergency Department complaining of suprapubic pressure. Patient states that symptoms began approximately 8 days ago. On that day, she was seen at [facility] where she had pelvic exam and urine sample. She was told that pelvic exam was fine and that her urine was possibly infected. She was given rx for Macrobid. She woke up the next morning to get rx filled, however states that she felt fine that morning, therefore did not start taking medication. Yesterday afternoon, she developed chills. She checked her temperature which was 101. She thought maybe her UTI came back, therefore took a dose of prescribed Macrobid. She also took a dose this morning. No other medications taken prior to arrival for symptoms. Patient endorses still feeling suprapubic pressure. No dysuria, urgency or frequency. No vaginal discharge. She also endorses intermittent epigastric pain. No nausea or vomiting. She does endorses diarrhea last night as well, but notes with her history of IBS this will occasionally happen. No blood in the stool.   Past Medical History:  Diagnosis Date  . Anorexia   . Anxiety   . Dysrhythmia   . Gall stones   . Headache   . Hypermobility of joint    Ehler Danlos Syndrome  . IBS (irritable bowel syndrome)   . Infection    bladder  . Jaundice   . Mastoiditis   . Migraine   . Morton neuroma   . Ovarian cyst   . Pancreatitis   . Psoriasis   . Psoriasis   . Scoliosis     There are no active problems to display for this patient.   Past Surgical History:  Procedure  Laterality Date  . DILATION AND CURETTAGE OF UTERUS  2002  . LAPAROSCOPIC CHOLECYSTECTOMY  2006    OB History    Gravida Para Term Preterm AB Living   1       1     SAB TAB Ectopic Multiple Live Births   1               Home Medications    Prior to Admission medications   Medication Sig Start Date End Date Taking? Authorizing Provider  budesonide (RHINOCORT ALLERGY) 32 MCG/ACT nasal spray Place 1 spray into both nostrils at bedtime.   Yes [provider]  cetirizine (ZYRTEC) 10 MG tablet Take 10 mg by mouth daily.   Yes [provider]  Melatonin 10 MG CAPS Take 10 mg by mouth at bedtime.    Yes [provider]  nitrofurantoin, macrocrystal-monohydrate, (MACROBID) 100 MG capsule Take 100 mg by mouth 2 (two) times daily.   Yes [provider]  dicyclomine (BENTYL) 20 MG tablet Take 1 tablet (20 mg total) by mouth 2 (two) times daily as needed for spasms. 08/24/17   Belma Dyches, Chase Picket, PA-C  sucralfate (CARAFATE) 1 g tablet Take 1 tablet (1 g total) by mouth 4 (four) times daily -  with meals and at bedtime. Patient not taking: Reported on 08/24/2017 03/05/17   Taeden Geller, Chase Picket, PA-C  Family History Family History  Problem Relation Age of Onset  . Heart disease Father   . Cancer Father        skin  . Osteoarthritis Father     Social History Social History  Substance Use Topics  . Smoking status: Former Smoker    Packs/day: 1.00    Types: Cigarettes  . Smokeless tobacco: Never Used  . Alcohol use No     Allergies   Sulfa antibiotics and Morphine and related   Review of Systems Review of Systems  Gastrointestinal: Positive for abdominal pain and diarrhea.  Genitourinary: Negative for dysuria, frequency, urgency and vaginal discharge.       + Suprapubic pressure  All other systems reviewed and are negative.    Physical Exam Updated Vital Signs BP 121/82   Pulse 82   Temp 98.8 F (37.1 C) (Oral)   Resp 16   Ht 5' 8.5"  (1.74 m)   Wt 107 kg (236 lb)   LMP 07/24/2017   SpO2 95%   BMI 35.36 kg/m   Physical Exam  Constitutional: She is oriented to person, place, and time. She appears well-developed and well-nourished. No distress.  HENT:  Head: Normocephalic and atraumatic.  Cardiovascular: Normal heart sounds.   No murmur heard. Tachycardic but regular.  Pulmonary/Chest: Effort normal and breath sounds normal. No respiratory distress.  Abdominal: Soft. Bowel sounds are normal. She exhibits no distension.  Tenderness to palpation of epigastrium. Negative Murphy's. Mild suprapubic tenderness. No CVA enderness. No rebound or guarding.  Musculoskeletal: She exhibits no edema.  Neurological: She is alert and oriented to person, place, and time.  Skin: Skin is warm and dry.  Nursing note and vitals reviewed.    ED Treatments / Results  Labs (all labs ordered are listed, but only abnormal results are displayed) Labs Reviewed  URINALYSIS, ROUTINE W REFLEX MICROSCOPIC - Abnormal; Notable for the following:       Result Value   Color, Urine COLORLESS (*)    Specific Gravity, Urine 1.001 (*)    Ketones, ur 20 (*)    All other components within normal limits  COMPREHENSIVE METABOLIC PANEL - Abnormal; Notable for the following:    Potassium 3.3 (*)    Glucose, Bld 100 (*)    BUN <5 (*)    Calcium 8.7 (*)    Albumin 3.4 (*)    AST 14 (*)    ALT 9 (*)    All other components within normal limits  CBC WITH DIFFERENTIAL/PLATELET - Abnormal; Notable for the following:    Lymphs Abs 0.6 (*)    All other components within normal limits  PREGNANCY, URINE  LIPASE, BLOOD    EKG  EKG Interpretation None       Radiology Ct Abdomen Pelvis W Contrast  Result Date: 08/24/2017 CLINICAL DATA:  Epigastric pain after eating. Possibly urinary tract infection. EXAM: CT ABDOMEN AND PELVIS WITH CONTRAST TECHNIQUE: Multidetector CT imaging of the abdomen and pelvis was performed using the standard protocol  following bolus administration of intravenous contrast. CONTRAST:  ISOVUE-300 IOPAMIDOL (ISOVUE-300) INJECTION 61% COMPARISON:  03/04/2017 ultrasound.  01/15/2017 CT. FINDINGS: Lower chest: Left base scarring. Heart size accentuated by a pectus excavatum deformity. Hepatobiliary: Normal liver. Cholecystectomy, without biliary ductal dilatation. Pancreas: Normal, without mass or ductal dilatation. Spleen: Normal in size, without focal abnormality. Adrenals/Urinary Tract: Normal adrenal glands. Normal kidneys, without hydronephrosis. Normal urinary bladder. Stomach/Bowel: Normal stomach, without wall thickening. Normal colon, appendix, and terminal ileum. Normal small  bowel. Vascular/Lymphatic: Normal caliber of the aorta and branch vessels. No abdominopelvic adenopathy. Reproductive: Normal uterus. Right ovarian corpus luteal cyst on image 71/series 3 Other: Trace free pelvic fluid is likely physiologic. Musculoskeletal: No acute osseous abnormality. IMPRESSION: 1.  No acute process in the abdomen or pelvis. 2. Right ovarian corpus luteal cyst. Electronically Signed   By: Jeronimo Greaves M.D.   On: 08/24/2017 08:39    Procedures Procedures (including critical care time)  Medications Ordered in ED Medications  sodium chloride 0.9 % bolus 1,000 mL (0 mLs Intravenous Stopped 08/24/17 0915)  acetaminophen (TYLENOL) tablet 1,000 mg (1,000 mg Oral Given 08/24/17 0726)  iopamidol (ISOVUE-300) 61 % injection (100 mLs  Contrast Given 08/24/17 0759)     Initial Impression / Assessment and Plan / ED Course  I have reviewed the triage vital signs and the nursing notes.  Pertinent labs & imaging results that were available during my care of the patient were reviewed by me and considered in my medical decision making (see chart for details).    Elaine [name] is a 36 y.o. female who presents to ED for epigastric pain and suprapubic pressure which began last night. Associated with diarrhea. Upon arrival,  temperature of 100.4 and slightly tachycardic. Tylenol and fluids given. Urine without signs of infection. Normal white count. Normal lipase. No vaginal discharge. She had pelvic exam as outpatient last week which was reassuring. Offered another pelvic for thoroughness however patient declined. Hx of cholecystectomy. CT abd/pelvis obtained with no acute process. Upon re-evaluation, temp and HR normalized. Patient feels better following fluids. Discussed reassuring work up and home care evaluation but spoke at length about strict return precautions and low threshold to return given fever. All questions answered.   Patient discussed with Dr. Patria Mane who agrees with treatment plan.     Final Clinical Impressions(s) / ED Diagnoses   Final diagnoses:  Epigastric pain  Fever, unspecified fever cause    New Prescriptions Discharge Medication List as of 08/24/2017  9:10 AM    START taking these medications   Details  dicyclomine (BENTYL) 20 MG tablet Take 1 tablet (20 mg total) by mouth 2 (two) times daily as needed for spasms., Starting Sat 08/24/2017, Print         Athenia Rys, Chase Picket, PA-C 08/24/17 1018    Azalia Bilis, MD 08/24/17 2302

## 2017-08-24 NOTE — ED Triage Notes (Signed)
The pt has multiple complaints  She had a urine done one week ago had some white cells   She was given a rx for macrobid that she did not take until yesterday  She has flank pain chest pain  Headache hx migraine headaches  lmp aufg

## 2017-08-24 NOTE — ED Notes (Signed)
Patient transported to CT 

## 2017-08-24 NOTE — ED Notes (Signed)
Pt alert no distress 

## 2017-08-24 NOTE — Discharge Instructions (Signed)
It was my pleasure taking care of you today!   Fortunately, your lab work and imaging were very reassuring.   At this time, there does not appear to be an acute, emergent cause for your abdominal pain. However, this does not mean that your abdominal pain may not become an emergency in the future. It is VERY important that you monitor your symptoms and return to the Emergency Department if you develop any of the following symptoms:  If your fever does not go away in 2-3 days.  Pain worsens.  You keep throwing up and can't keep fluids down. You pass bloody or black tarry stools.  There is bright red blood in the stool. You do not seem to be getting better.  New or worsening symptoms develop, any additional concerns.  You have any questions or concerns.

## 2017-12-05 ENCOUNTER — Other Ambulatory Visit: Payer: Self-pay

## 2017-12-05 ENCOUNTER — Encounter (HOSPITAL_COMMUNITY): Payer: Self-pay | Admitting: Emergency Medicine

## 2017-12-05 ENCOUNTER — Ambulatory Visit (HOSPITAL_COMMUNITY)
Admission: EM | Admit: 2017-12-05 | Discharge: 2017-12-05 | Disposition: A | Discharge status: Home / Self Care | Payer: Self-pay

## 2017-12-05 DIAGNOSIS — R0982 Postnasal drip: Secondary | ICD-10-CM

## 2017-12-05 DIAGNOSIS — J069 Acute upper respiratory infection, unspecified: Secondary | ICD-10-CM

## 2017-12-05 NOTE — ED Triage Notes (Signed)
Symptoms for 2 weeks, worsened today.  Patient says she feels like a lump in throat, sinus congestion and drainage.

## 2017-12-05 NOTE — Discharge Instructions (Signed)
The throat and lung exam are completely normal in regards to patency. Back of your throat has cobblestoning which is a result of drainage. There is no significant swelling. No enlarged tonsils or other tissues. Continue to take the Zyrtec. May also take Chlor-Trimeton 2 or 4 mg before bedtime or every 4 hours as needed for drainage. May cause drowsiness. Drink plenty of fluids and water to stay hydrated and keep the drainage off the throat.

## 2017-12-05 NOTE — ED Provider Notes (Signed)
[facility]    CSN: [id number] Arrival date & time: 12/05/17  1624     History   Chief Complaint Chief Complaint  Patient presents with  . URI    HPI Elaine [name] is a 36 y.o. female.   36 year old female with a history of allergies takes Zyrtec and Rhinocort daily. For the past couple weeks she feels like there is been some swelling in her throat. She is complaining of PND. She also feels slight her hearing is a little muffled. No fever or chills. No trouble breathing.      Past Medical History:  Diagnosis Date  . Anorexia   . Anxiety   . Dysrhythmia   . Gall stones   . Headache   . Hypermobility of joint    Ehler Danlos Syndrome  . IBS (irritable bowel syndrome)   . Infection    bladder  . Jaundice   . Mastoiditis   . Migraine   . Morton neuroma   . Ovarian cyst   . Pancreatitis   . Psoriasis   . Psoriasis   . Scoliosis     There are no active problems to display for this patient.   Past Surgical History:  Procedure Laterality Date  . DILATION AND CURETTAGE OF UTERUS  2002  . LAPAROSCOPIC CHOLECYSTECTOMY  2006    OB History    Gravida Para Term Preterm AB Living   1       1     SAB TAB Ectopic Multiple Live Births   1               Home Medications    Prior to Admission medications   Medication Sig Start Date End Date Taking? Authorizing Provider  budesonide (RHINOCORT ALLERGY) 32 MCG/ACT nasal spray Place 1 spray into both nostrils at bedtime.   Yes [provider]  cetirizine (ZYRTEC) 10 MG tablet Take 10 mg by mouth daily.   Yes [provider]  Melatonin 10 MG CAPS Take 10 mg by mouth at bedtime.    Yes [provider]  dicyclomine (BENTYL) 20 MG tablet Take 1 tablet (20 mg total) by mouth 2 (two) times daily as needed for spasms. 08/24/17   Ward, Chase PicketJaime Pilcher, PA-C  nitrofurantoin, macrocrystal-monohydrate, (MACROBID) 100 MG capsule Take 100 mg by mouth 2 (two) times daily.    [provider]  sucralfate (CARAFATE) 1 g tablet Take 1 tablet (1 g total) by mouth 4 (four) times daily -  with meals and at bedtime. Patient not taking: Reported on 08/24/2017 03/05/17   Ward, Chase PicketJaime Pilcher, PA-C    Family History Family History  Problem Relation Age of Onset  . Heart disease Father   . Cancer Father        skin  . Osteoarthritis Father     Social History Social History   Tobacco Use  . Smoking status: Former Smoker    Packs/day: 1.00    Types: Cigarettes  . Smokeless tobacco: Never Used  Substance Use Topics  . Alcohol use: No  . Drug use: No     Allergies   Sulfa antibiotics and Morphine and related   Review of Systems Review of Systems  Constitutional: Negative for activity change, fatigue and fever.  HENT:       As per history of present illness  Respiratory: Negative for cough and shortness of breath.   Neurological: Negative.      Physical Exam Triage Vital Signs ED  Triage Vitals  Enc Vitals Group     BP 12/05/17 1822 (!) 154/108     Pulse Rate 12/05/17 1822 (!) 105     Resp 12/05/17 1822 20     Temp 12/05/17 1822 99 F (37.2 C)     Temp Source 12/05/17 1822 Oral     SpO2 12/05/17 1822 100 %     Weight --      Height --      Head Circumference --      Peak Flow --      Pain Score 12/05/17 1820 3     Pain Loc --      Pain Edu? --      Excl. in GC? --    No data found.  Updated Vital Signs BP (!) 150/94 (BP Location: Left Arm) Comment (BP Location): verified sizing of cuff  Pulse (!) 105   Temp 99 F (37.2 C) (Oral)   Resp 20   LMP 12/02/2017   SpO2 100%   Visual Acuity Right Eye Distance:   Left Eye Distance:   Bilateral Distance:    Right Eye Near:   Left Eye Near:    Bilateral Near:     Physical Exam  Constitutional: She is oriented to person, place, and time. She appears well-developed and well-nourished. No distress.  Generally healthy-appearing 36 year old female with multiple tattoos to the neck and  extremities, multiple piercings to the face. Relaxed posturing. Respirations even and nonlabored. Speech is clear, normal voice.  HENT:  Mouth/Throat: No oropharyngeal exudate.  Bilateral TMs are clear, mild retraction.  Oropharynx with light erythema and cobblestoning.  Neck: Neck supple.  Cardiovascular: Normal rate, regular rhythm, normal heart sounds and intact distal pulses.  Pulmonary/Chest: Effort normal and breath sounds normal. No stridor. No respiratory distress. She has no wheezes. She has no rales.  Neurological: She is alert and oriented to person, place, and time.  Skin: Skin is warm and dry. She is not diaphoretic.  Nursing note and vitals reviewed.    UC Treatments / Results  Labs (all labs ordered are listed, but only abnormal results are displayed) Labs Reviewed - No data to display  EKG  EKG Interpretation None       Radiology No results found.  Procedures Procedures (including critical care time)  Medications Ordered in UC Medications - No data to display   Initial Impression / Assessment and Plan / UC Course  I have reviewed the triage vital signs and the nursing notes.  Pertinent labs & imaging results that were available during my care of the patient were reviewed by me and considered in my medical decision making (see chart for details).    The throat and lung exam are completely normal in regards to patency. Back of your throat has cobblestoning which is a result of drainage. There is no significant swelling. No enlarged tonsils or other tissues. Continue to take the Zyrtec. May also take Chlor-Trimeton 2 or 4 mg before bedtime or every 4 hours as needed for drainage. May cause drowsiness. Drink plenty of fluids and water to stay hydrated and keep the drainage off the throat.     Final Clinical Impressions(s) / UC Diagnoses   Final diagnoses:  PND (post-nasal drip)  Viral upper respiratory tract infection    ED Discharge Orders    None         Controlled Substance Prescriptions [place] Controlled Substance Registry consulted? Not Applicable   Hayden RasmussenMabe, Juliya Magill, NP 12/05/17 2235

## 2018-01-16 ENCOUNTER — Other Ambulatory Visit: Payer: Self-pay

## 2018-01-16 ENCOUNTER — Encounter (HOSPITAL_COMMUNITY): Payer: Self-pay | Admitting: Emergency Medicine

## 2018-01-16 ENCOUNTER — Emergency Department (HOSPITAL_COMMUNITY): Payer: Self-pay

## 2018-01-16 DIAGNOSIS — Z79899 Other long term (current) drug therapy: Secondary | ICD-10-CM | POA: Insufficient documentation

## 2018-01-16 DIAGNOSIS — R002 Palpitations: Secondary | ICD-10-CM | POA: Insufficient documentation

## 2018-01-16 DIAGNOSIS — Z87891 Personal history of nicotine dependence: Secondary | ICD-10-CM | POA: Insufficient documentation

## 2018-01-16 LAB — CBC
HCT: 39.5 % (ref 36.0–46.0)
HEMOGLOBIN: 13 g/dL (ref 12.0–15.0)
MCH: 30.5 pg (ref 26.0–34.0)
MCHC: 32.9 g/dL (ref 30.0–36.0)
MCV: 92.7 fL (ref 78.0–100.0)
Platelets: 186 10*3/uL (ref 150–400)
RBC: 4.26 MIL/uL (ref 3.87–5.11)
RDW: 14.9 % (ref 11.5–15.5)
WBC: 4.9 10*3/uL (ref 4.0–10.5)

## 2018-01-16 LAB — I-STAT TROPONIN, ED: Troponin i, poc: 0 ng/mL (ref 0.00–0.08)

## 2018-01-16 LAB — BASIC METABOLIC PANEL
ANION GAP: 12 (ref 5–15)
BUN: <5 mg/dL — ABNORMAL LOW (ref 6–20)
CO2: 19 mmol/L — ABNORMAL LOW (ref 22–32)
Calcium: 8.8 mg/dL — ABNORMAL LOW (ref 8.9–10.3)
Chloride: 109 mmol/L (ref 101–111)
Creatinine, Ser: 0.66 mg/dL (ref 0.44–1.00)
GLUCOSE: 93 mg/dL (ref 65–99)
Potassium: 3.6 mmol/L (ref 3.5–5.1)
Sodium: 140 mmol/L (ref 135–145)

## 2018-01-16 LAB — I-STAT BETA HCG BLOOD, ED (MC, WL, AP ONLY): I-stat hCG, quantitative: <5 m[IU]/mL (ref <5)

## 2018-01-16 NOTE — ED Triage Notes (Signed)
Pt states that heart has been racing starting last night-- has hx of "arrythmias" -- pt does take zyrtec alacor for sore throat--  Pt denies fever-- c/o chest pain radiating to back

## 2018-01-17 ENCOUNTER — Emergency Department (HOSPITAL_COMMUNITY)
Admission: EM | Admit: 2018-01-17 | Discharge: 2018-01-17 | Disposition: A | Discharge status: Home / Self Care | Payer: Self-pay | Attending: Emergency Medicine | Admitting: Emergency Medicine

## 2018-01-17 DIAGNOSIS — R002 Palpitations: Secondary | ICD-10-CM

## 2018-01-17 LAB — I-STAT TROPONIN, ED: TROPONIN I, POC: 0 ng/mL (ref 0.00–0.08)

## 2018-01-17 LAB — HEPATIC FUNCTION PANEL
ALBUMIN: 3.5 g/dL (ref 3.5–5.0)
ALT: 9 U/L — ABNORMAL LOW (ref 14–54)
AST: 13 U/L — ABNORMAL LOW (ref 15–41)
Alkaline Phosphatase: 44 U/L (ref 38–126)
Bilirubin, Direct: <0.1 mg/dL — ABNORMAL LOW (ref 0.1–0.5)
TOTAL PROTEIN: 6.4 g/dL — AB (ref 6.5–8.1)
Total Bilirubin: 0.9 mg/dL (ref 0.3–1.2)

## 2018-01-17 LAB — D-DIMER, QUANTITATIVE: D-Dimer, Quant: 0.43 ug/mL-FEU (ref 0.00–0.50)

## 2018-01-17 LAB — T4, FREE: FREE T4: 0.88 ng/dL (ref 0.61–1.12)

## 2018-01-17 LAB — LIPASE, BLOOD: LIPASE: 21 U/L (ref 11–51)

## 2018-01-17 LAB — TSH: TSH: 3.838 u[IU]/mL (ref 0.350–4.500)

## 2018-01-17 MED ORDER — SODIUM CHLORIDE 0.9 % IV BOLUS (SEPSIS)
1000.0000 mL | Freq: Once | INTRAVENOUS | Status: AC
  Administered 2018-01-17: 1000 mL via INTRAVENOUS

## 2018-01-17 NOTE — ED Notes (Signed)
Attempted IV access x2, without success.  

## 2018-01-17 NOTE — Discharge Instructions (Signed)
Work up in the emergency department was normal today. Your hemoglobin, electrolytes, thyroid, blood counts, EKG, heart enzymes and chest x-rays were normal. You were slightly dehydrated which could be contributing to your palpitations. Long term tobacco use also can cause increased heart rate and palpitations. Ultimately, you need to see a cardiologist. Call cardiologist above to inquire about cost and resources.   Return for exertional chest pain, shortness of breath, feeling faint, passing out, fevers, cough, worsening palpitations with sustained heart rate of greater than 110. Your blood pressure was intermittently elevated today. Keep an eye on your blood pressure, if your blood pressure is higher than 140/90 persistently you may need to start blood pressure medications. Contact [facility] to establish care with a primary care provider for regular, routine medical care.  This clinic accepts patients without medical insurance. A primary care provider can adjust your daily medications and give you refills and refer you to specialists.

## 2018-01-17 NOTE — ED Notes (Signed)
IV team at bedside 

## 2018-01-17 NOTE — ED Provider Notes (Signed)
[facility] EMERGENCY DEPARTMENT Provider Note   CSN: [id number] Arrival date & time: 01/16/18  1715  History   Chief Complaint Chief Complaint  Patient presents with  . Chest Pain  . palpitations    HPI Elaine [name] is a 37 y.o. female with history of anxiety, gallstones, pancreatitis, ulcers, migraines here for evaluation of palpitations and feeling like her heart skipping really fast and out of rhythm for the last 2 days. Symptoms began while she was doing laundry and have persisted. Associated symptoms include a stabbing middle, left-sided chest pain that radiates into her middle upper back and shortness of breath . Her fitbit told her her HR was 120s and stayed like that for about two hours. Her resting HR is in the 70s. These symptoms did not worsen with exertion and also felt palpitations while at rest. Has continued to felt like her heart is skipping in the last 13 hours since she has been in the ED. Had palpitations and had a holter monitor by cardiologist in her mid 55s but was told she had arrhythmia but unsure of what type. Her cousin died of heart disease in his 30s. Smokes 4-5 cigarettes daily. No recent vomiting or diarrhea to cause her to be dehydrated. Drinks green tea but no other sources of caffeine. Denies illicit drug use, thyroid disorders, recent medication changes, increased stress/anxiety.   HPI  Past Medical History:  Diagnosis Date  . Anorexia   . Anxiety   . Dysrhythmia   . Gall stones   . Headache   . Hypermobility of joint    Ehler Danlos Syndrome  . IBS (irritable bowel syndrome)   . Infection    bladder  . Jaundice   . Mastoiditis   . Migraine   . Morton neuroma   . Ovarian cyst   . Pancreatitis   . Psoriasis   . Psoriasis   . Scoliosis     There are no active problems to display for this patient.   Past Surgical History:  Procedure Laterality Date  . DILATION AND CURETTAGE OF UTERUS  2002  . LAPAROSCOPIC  CHOLECYSTECTOMY  2006    OB History    Gravida Para Term Preterm AB Living   1       1     SAB TAB Ectopic Multiple Live Births   1               Home Medications    Prior to Admission medications   Medication Sig Start Date End Date Taking? Authorizing Provider  Brompheniramine-Phenylephrine (ALACOL PO) Take 1 tablet by mouth every evening.   Yes [provider]  budesonide (RHINOCORT ALLERGY) 32 MCG/ACT nasal spray Place 1 spray into both nostrils at bedtime.   Yes [provider]  cetirizine (ZYRTEC) 10 MG tablet Take 10 mg by mouth daily.   Yes [provider]  Melatonin 10 MG CAPS Take 10 mg by mouth at bedtime.    Yes [provider]  omeprazole (PRILOSEC) 20 MG capsule Take 20 mg by mouth daily.   Yes [provider]  dicyclomine (BENTYL) 20 MG tablet Take 1 tablet (20 mg total) by mouth 2 (two) times daily as needed for spasms. Patient not taking: Reported on 01/17/2018 08/24/17   Ward, Chase Picket, PA-C  sucralfate (CARAFATE) 1 g tablet Take 1 tablet (1 g total) by mouth 4 (four) times daily -  with meals and at bedtime. Patient not taking: Reported on 08/24/2017  03/05/17   Ward, Chase PicketJaime Pilcher, PA-C    Family History Family History  Problem Relation Age of Onset  . Heart disease Father   . Cancer Father        skin  . Osteoarthritis Father     Social History Social History   Tobacco Use  . Smoking status: Former Smoker    Packs/day: 1.00    Types: Cigarettes  . Smokeless tobacco: Never Used  Substance Use Topics  . Alcohol use: No  . Drug use: No     Allergies   Sulfa antibiotics and Morphine and related   Review of Systems Review of Systems  Cardiovascular: Positive for chest pain and palpitations.  All other systems reviewed and are negative.    Physical Exam Updated Vital Signs BP 124/89 (BP Location: Left Arm)   Pulse 79   Temp 98.7 F (37.1 C) (Oral)   Resp 16   Ht 5\' 9"  (1.753 m)   Wt 80.7 kg  (178 lb)   LMP 12/28/2017   SpO2 98%   BMI 26.29 kg/m   Physical Exam  Constitutional: She appears well-developed and well-nourished.  NAD. Non toxic.   HENT:  Head: Normocephalic and atraumatic.  Nose: Nose normal.  Moist mucous membranes. Tonsils and oropharynx normal  Eyes: Conjunctivae, EOM and lids are normal.  Neck: Trachea normal and normal range of motion.  Neck is supple. Trachea midline.   Cardiovascular: Normal rate, regular rhythm, S1 normal, S2 normal and normal heart sounds.  Pulses:      Carotid pulses are 2+ on the right side, and 2+ on the left side.      Radial pulses are 2+ on the right side, and 2+ on the left side.       Dorsalis pedis pulses are 2+ on the right side, and 2+ on the left side.  RRR. No clicks or murmurs. No LE edema or calf tenderness.   Pulmonary/Chest: Effort normal and breath sounds normal. No respiratory distress. She has no decreased breath sounds. She has no rhonchi.  No reproducible chest wall tenderness. CP not reproducible with AROM of upper extremities. No rales or wheezing.  Abdominal: Soft. Bowel sounds are normal. There is tenderness.  Mild epigastric tenderness. No distention. No G/R/R.  Neurological: She is alert. GCS eye subscore is 4. GCS verbal subscore is 5. GCS motor subscore is 6.  Skin: Skin is warm and dry. Capillary refill takes less than 2 seconds.  No rash to chest wall  Psychiatric: She has a normal mood and affect. Her speech is normal and behavior is normal. Judgment and thought content normal. Cognition and memory are normal.     ED Treatments / Results  Labs (all labs ordered are listed, but only abnormal results are displayed) Labs Reviewed  BASIC METABOLIC PANEL - Abnormal; Notable for the following components:      Result Value   CO2 19 (*)    BUN <5 (*)    Calcium 8.8 (*)    All other components within normal limits  HEPATIC FUNCTION PANEL - Abnormal; Notable for the following components:   Total  Protein 6.4 (*)    AST 13 (*)    ALT 9 (*)    Bilirubin, Direct <0.1 (*)    All other components within normal limits  CBC  D-DIMER, QUANTITATIVE (NOT AT [facility])  LIPASE, BLOOD  TSH  T4, FREE  I-STAT TROPONIN, ED  I-STAT BETA HCG BLOOD, ED ([facility], [facility], [facility] ONLY)  I-STAT TROPONIN, ED    EKG  EKG Interpretation  Date/Time:  Thursday January 16 2018 17:38:56 EST Ventricular Rate:  106 PR Interval:  136 QRS Duration: 80 QT Interval:  338 QTC Calculation: 448 R Axis:   88 Text Interpretation:  Sinus tachycardia Cannot rule out Anterior infarct , age undetermined Abnormal ECG since last tracing no significant change Confirmed by Rolan Bucco [phone]) on 01/17/2018 7:29:31 AM       Radiology Dg Chest 2 View  Result Date: 01/16/2018 CLINICAL DATA:  Central chest pain radiating posteriorly beginning today, shortness of breath with exertion, history of Ehlers-Danlos syndrome, former smoker EXAM: CHEST  2 VIEW COMPARISON:  03/04/2017 FINDINGS: Normal heart size, mediastinal contours, and pulmonary vascularity. Lungs clear. No pleural effusion or pneumothorax. Bones unremarkable. IMPRESSION: No acute abnormalities. Electronically Signed   By: Ulyses Southward M.D.   On: 01/16/2018 19:54    Procedures Procedures (including critical care time)  Medications Ordered in ED Medications  sodium chloride 0.9 % bolus 1,000 mL (0 mLs Intravenous Stopped 01/17/18 0926)     Initial Impression / Assessment and Plan / ED Course  I have reviewed the triage vital signs and the nursing notes.  Pertinent labs & imaging results that were available during my care of the patient were reviewed by me and considered in my medical decision making (see chart for details).    37 year old female with history of tobacco abuse and reported history of arrhythmias is here for evaluation of palpitations. She has history of ulcers and pancreatitis but has been compliant with medications for this, ulcer pain is different. She  smokes cigarettes daily but denies recent medication changes, thyroid disorders, illicit drug use, increased stress or anxiety, rectal or vaginal bleeding. In this young pt w/o heart disease suspicion highest for anxiety, tobacco use causing symptoms. Less likely SVT, thyroid, PE, cardiac etiology. She does not look dehydrated on exam. No clicks or murmurs on heart auscultation. Exam unremarkable.  She has been in ED waiting room for 13 hours. Her labs including troponin 2, hemoglobin, WBCs are unremarkable. Given h/o pancreatitis/ulcers/gallstones, will r/o this contributing to CP and add LFT and lipase. Will get repeat EKG, orthostatics, add TSH/T4. Given radiating CP will add d-dimer, tachycardic initially and cannot PERC out however I have low suspicion for PE in this patient.   Final Clinical Impressions(s) / ED Diagnoses   Remaining lab work including d-dimer, lipase, LFTs unremarkable. Patient was given 1 L IV fluids as she didn't be hypovolemic based on orthostatics. Tachycardia resolved, vital signs normalized. Repeat EKG unchanged. At this time, patient will be discharged with cardiology follow-up. Discussed return precautions, advised her increased stress, dehydration and chronic tobacco use may be contributing to her symptoms. Final diagnoses:  Palpitations    ED Discharge Orders    None       Jerrell Mylar 01/17/18 6045    Gilda Crease, MD 01/18/18 [phone]

## 2018-03-28 ENCOUNTER — Encounter (HOSPITAL_COMMUNITY): Payer: Self-pay | Admitting: Emergency Medicine

## 2018-03-28 ENCOUNTER — Ambulatory Visit (HOSPITAL_COMMUNITY)
Admission: EM | Admit: 2018-03-28 | Discharge: 2018-03-28 | Disposition: A | Discharge status: Home / Self Care | Payer: Self-pay | Attending: Internal Medicine | Admitting: Internal Medicine

## 2018-03-28 ENCOUNTER — Other Ambulatory Visit: Payer: Self-pay

## 2018-03-28 DIAGNOSIS — Z882 Allergy status to sulfonamides status: Secondary | ICD-10-CM | POA: Insufficient documentation

## 2018-03-28 DIAGNOSIS — R300 Dysuria: Secondary | ICD-10-CM | POA: Insufficient documentation

## 2018-03-28 DIAGNOSIS — Z881 Allergy status to other antibiotic agents status: Secondary | ICD-10-CM | POA: Insufficient documentation

## 2018-03-28 DIAGNOSIS — Z885 Allergy status to narcotic agent status: Secondary | ICD-10-CM | POA: Insufficient documentation

## 2018-03-28 DIAGNOSIS — Z87891 Personal history of nicotine dependence: Secondary | ICD-10-CM | POA: Insufficient documentation

## 2018-03-28 LAB — POCT URINALYSIS DIP (DEVICE)
Bilirubin Urine: NEGATIVE
Glucose, UA: NEGATIVE mg/dL
HGB URINE DIPSTICK: NEGATIVE
Ketones, ur: 80 mg/dL — AB
LEUKOCYTES UA: NEGATIVE
Nitrite: NEGATIVE
PH: 6 (ref 5.0–8.0)
PROTEIN: NEGATIVE mg/dL
UROBILINOGEN UA: 0.2 mg/dL (ref 0.0–1.0)

## 2018-03-28 MED ORDER — FLUCONAZOLE 150 MG PO TABS: 150.0000 mg | ORAL_TABLET | Freq: Once | ORAL | 0 refills | Status: AC

## 2018-03-28 NOTE — ED Triage Notes (Signed)
C/o dysuria, urinary frequency with pelvic pain onset 2 days

## 2018-03-28 NOTE — ED Provider Notes (Signed)
[facility]    CSN: [id number] Arrival date & time: 03/28/18  1700     History   Chief Complaint Chief Complaint  Patient presents with  . Dysuria    HPI Elaine [name] is a 37 y.o. female Patient is presenting with symptoms of dysuria, increased frequency, incomplete voiding, abdominal pressure x 2 days.  She has felt some minor pelvic pain as well as feeling like her bladder is aching.  Last menstrual period was last week, she does note that it was a week early and was heavier than normal.  Patient is not on any form of birth control.  Denies hematuria, vaginal discharge. Denies fever, nausea, vomiting, flank/back pain.    HPI  Past Medical History:  Diagnosis Date  . Anorexia   . Anxiety   . Dysrhythmia   . Gall stones   . Headache   . Hypermobility of joint    Ehler Danlos Syndrome  . IBS (irritable bowel syndrome)   . Infection    bladder  . Jaundice   . Mastoiditis   . Migraine   . Morton neuroma   . Ovarian cyst   . Pancreatitis   . Psoriasis   . Psoriasis   . Scoliosis     There are no active problems to display for this patient.   Past Surgical History:  Procedure Laterality Date  . DILATION AND CURETTAGE OF UTERUS  2002  . LAPAROSCOPIC CHOLECYSTECTOMY  2006    OB History    Gravida  1   Para      Term      Preterm      AB  1   Living        SAB  1   TAB      Ectopic      Multiple      Live Births               Home Medications    Prior to Admission medications   Medication Sig Start Date End Date Taking? Authorizing Provider  famotidine (PEPCID) 10 MG tablet Take 10 mg by mouth 2 (two) times daily.   Yes [provider]  Brompheniramine-Phenylephrine (ALACOL PO) Take 1 tablet by mouth every evening.    [provider]  budesonide (RHINOCORT ALLERGY) 32 MCG/ACT nasal spray Place 1 spray into both nostrils at bedtime.    [provider]  cetirizine (ZYRTEC) 10 MG tablet Take 10 mg  by mouth daily.    [provider]  dicyclomine (BENTYL) 20 MG tablet Take 1 tablet (20 mg total) by mouth 2 (two) times daily as needed for spasms. Patient not taking: Reported on 01/17/2018 08/24/17   Ward, Chase PicketJaime Pilcher, PA-C  fluconazole (DIFLUCAN) 150 MG tablet Take 1 tablet (150 mg total) by mouth once for 1 dose. 03/28/18 03/28/18  Ashe Graybeal C, PA-C  Melatonin 10 MG CAPS Take 10 mg by mouth at bedtime.     [provider]  omeprazole (PRILOSEC) 20 MG capsule Take 20 mg by mouth daily.    [provider]  sucralfate (CARAFATE) 1 g tablet Take 1 tablet (1 g total) by mouth 4 (four) times daily -  with meals and at bedtime. Patient not taking: Reported on 08/24/2017 03/05/17   Ward, Chase PicketJaime Pilcher, PA-C    Family History Family History  Problem Relation Age of Onset  . Heart disease Father   . Cancer Father        skin  .  Osteoarthritis Father     Social History Social History   Tobacco Use  . Smoking status: Former Smoker    Packs/day: 1.00    Types: Cigarettes  . Smokeless tobacco: Never Used  Substance Use Topics  . Alcohol use: No  . Drug use: No     Allergies   Sulfa antibiotics; Ciprofloxacin; and Morphine and related   Review of Systems Review of Systems  Constitutional: Negative for fever.  Respiratory: Negative for shortness of breath.   Cardiovascular: Negative for chest pain.  Gastrointestinal: Negative for abdominal pain, diarrhea, nausea and vomiting.  Genitourinary: Positive for dysuria, frequency and urgency. Negative for flank pain, genital sores, hematuria, menstrual problem, vaginal bleeding, vaginal discharge and vaginal pain.  Musculoskeletal: Negative for back pain.  Skin: Negative for rash.  Neurological: Negative for dizziness, light-headedness and headaches.     Physical Exam Triage Vital Signs ED Triage Vitals  Enc Vitals Group     BP 03/28/18 1810 (!) 123/94     Pulse Rate 03/28/18 1810 93     Resp --       Temp 03/28/18 1810 98.8 F (37.1 C)     Temp Source 03/28/18 1810 Oral     SpO2 03/28/18 1810 100 %     Weight --      Height --      Head Circumference --      Peak Flow --      Pain Score 03/28/18 1806 4     Pain Loc --      Pain Edu? --      Excl. in GC? --    No data found.  Updated Vital Signs BP (!) 123/94 (BP Location: Left Arm)   Pulse 93   Temp 98.8 F (37.1 C) (Oral)   LMP 03/18/2018 Comment: vasectomy  SpO2 100%   Visual Acuity Right Eye Distance:   Left Eye Distance:   Bilateral Distance:    Right Eye Near:   Left Eye Near:    Bilateral Near:     Physical Exam  Constitutional: She appears well-developed and well-nourished. No distress.  HENT:  Head: Normocephalic and atraumatic.  Eyes: Conjunctivae are normal.  Neck: Neck supple.  Cardiovascular: Normal rate and regular rhythm.  No murmur heard. Pulmonary/Chest: Effort normal and breath sounds normal. No respiratory distress.  Abdominal: Soft. There is no tenderness.  Mild tenderness to lower abdomen bilaterally/suprapubic area  Musculoskeletal: She exhibits no edema.  Neurological: She is alert.  Skin: Skin is warm and dry.  Psychiatric: She has a normal mood and affect.  Nursing note and vitals reviewed.    UC Treatments / Results  Labs (all labs ordered are listed, but only abnormal results are displayed) Labs Reviewed  POCT URINALYSIS DIP (DEVICE) - Abnormal; Notable for the following components:      Result Value   Ketones, ur 80 (*)    All other components within normal limits  CERVICOVAGINAL ANCILLARY ONLY    EKG None Radiology No results found.  Procedures Procedures (including critical care time)  Medications Ordered in UC Medications - No data to display   Initial Impression / Assessment and Plan / UC Course  I have reviewed the triage vital signs and the nursing notes.  Pertinent labs & imaging results that were available during my care of the patient were reviewed by  me and considered in my medical decision making (see chart for details).     Patient with negative leuks and nitrites on  UA.  Discussed possible vaginal causes of dysuria.  Also discussed about possibility of interstitial cystitis.  Patient does not have insurance at this time and does not want to follow-up with urology due to financial reasons.  Provided information for community health and wellness.  We will go ahead and empirically treat for yeast, vaginal swab sent to check for BV and yeast.  Also discussed some behavior modification/lifestyle modifications for interstitial cystitis. Discussed strict return precautions. Patient verbalized understanding and is agreeable with plan.   Final Clinical Impressions(s) / UC Diagnoses   Final diagnoses:  Dysuria    ED Discharge Orders        Ordered    fluconazole (DIFLUCAN) 150 MG tablet   Once     03/28/18 1847       Controlled Substance Prescriptions [place] Controlled Substance Registry consulted? Not Applicable   Lew Dawes, [place] 03/28/18 2102

## 2018-03-28 NOTE — Discharge Instructions (Signed)
We will check to see if you have yeast or BV.  I have went ahead and sent in medication for yeast.  We will get these results in approximately 3-4 days and call you in approximately 1 week.  In the meantime please see his recommendations below, use Tylenol as well as Azo for discomfort.  Please use heating pads, Avoidance of activities or food or beverages that exacerbate symptoms - Common irritants include caffeine, alcohol, artificial sweeteners, hot pepper, and vitamin C-containing foods

## 2018-03-31 LAB — CERVICOVAGINAL ANCILLARY ONLY
BACTERIAL VAGINITIS: NEGATIVE
CHLAMYDIA, DNA PROBE: NEGATIVE
Candida vaginitis: NEGATIVE
NEISSERIA GONORRHEA: NEGATIVE
Trichomonas: NEGATIVE

## 2018-04-02 ENCOUNTER — Telehealth (HOSPITAL_COMMUNITY): Payer: Self-pay

## 2018-04-02 NOTE — Telephone Encounter (Signed)
Attempted to reach patient regarding negative STD results. No answer. Message sent in MyChart.

## 2018-06-10 ENCOUNTER — Encounter (HOSPITAL_COMMUNITY): Payer: Self-pay | Admitting: Emergency Medicine

## 2018-06-10 ENCOUNTER — Other Ambulatory Visit: Payer: Self-pay

## 2018-06-10 ENCOUNTER — Ambulatory Visit (HOSPITAL_COMMUNITY)
Admission: EM | Admit: 2018-06-10 | Discharge: 2018-06-10 | Disposition: A | Discharge status: Home / Self Care | Payer: Self-pay | Attending: Physician Assistant | Admitting: Physician Assistant

## 2018-06-10 DIAGNOSIS — L723 Sebaceous cyst: Secondary | ICD-10-CM

## 2018-06-10 DIAGNOSIS — L089 Local infection of the skin and subcutaneous tissue, unspecified: Secondary | ICD-10-CM

## 2018-06-10 MED ORDER — MUPIROCIN 2 % EX OINT: 1.0000 "application " | TOPICAL_OINTMENT | Freq: Two times a day (BID) | CUTANEOUS | 0 refills | Status: DC

## 2018-06-10 NOTE — Discharge Instructions (Signed)
Please place a warm compress on the area often.  Take over-the-counter pain reliever for pain.  Apply the mupirocin ointment about twice a day.  Okay to wash with soap and water.

## 2018-06-10 NOTE — ED Provider Notes (Signed)
06/10/2018 6:49 PM   DOB: 1981/09/10 / MRN: [id number]  SUBJECTIVE:  Elaine [name] is a 37 y.o. female presenting for bump on her back.  Been present since December and she is been trying to pop it.  States that can be painful at times.  Does not want to take p.o. Antibiotics.  She is allergic to sulfa antibiotics; ciprofloxacin; and morphine and related.   She  has a past medical history of Anorexia, Anxiety, Dysrhythmia, Gall stones, Headache, Hypermobility of joint, IBS (irritable bowel syndrome), Infection, Jaundice, Mastoiditis, Migraine, Morton neuroma, Ovarian cyst, Pancreatitis, Psoriasis, Psoriasis, and Scoliosis.    She  reports that she has quit smoking. Her smoking use included cigarettes. She smoked 1.00 pack per day. She has never used smokeless tobacco. She reports that she does not drink alcohol or use drugs. She  has no sexual activity history on file. The patient  has a past surgical history that includes Dilation and curettage of uterus (2002) and Laparoscopic cholecystectomy (2006).  Her family history includes Cancer in her father; Heart disease in her father; Osteoarthritis in her father.  ROS per HPI  OBJECTIVE:  BP (!) 133/95 (BP Location: Left Arm)   Pulse 86   Temp 98.4 F (36.9 C) (Oral)   Resp 18   SpO2 98%   Wt Readings from Last 3 Encounters:  01/16/18 178 lb (80.7 kg)  08/24/17 236 lb (107 kg)  03/04/17 250 lb (113.4 kg)   Temp Readings from Last 3 Encounters:  06/10/18 98.4 F (36.9 C) (Oral)  03/28/18 98.8 F (37.1 C) (Oral)  01/17/18 98.7 F (37.1 C) (Oral)   BP Readings from Last 3 Encounters:  06/10/18 (!) 133/95  03/28/18 (!) 123/94  01/17/18 124/89   Pulse Readings from Last 3 Encounters:  06/10/18 86  03/28/18 93  01/17/18 79    Physical Exam  Constitutional: She is oriented to person, place, and time. She appears well-nourished. No distress.  Eyes: Pupils are equal, round, and reactive to light. EOM are normal.  Cardiovascular:  Normal rate.  Pulmonary/Chest: Effort normal.  Abdominal: She exhibits no distension.  Neurological: She is alert and oriented to person, place, and time. No cranial nerve deficit. Gait normal.  Skin: Skin is dry. She is not diaphoretic.     Psychiatric: She has a normal mood and affect.  Vitals reviewed.   No results found for this or any previous visit (from the past 72 hour(s)).  No results found.  ASSESSMENT AND PLAN:   Infected sebaceous cyst of skin    Discharge Instructions     Please place a warm compress on the area often.  Take over-the-counter pain reliever for pain.  Apply the mupirocin ointment about twice a day.  Okay to wash with soap and water.        The patient is advised to call or return to clinic if she does not see an improvement in symptoms, or to seek the care of the closest emergency department if she worsens with the above plan.   Deliah BostonMichael Yarissa Reining, MHS, PA-C 06/10/2018 6:49 PM   Ofilia Neaslark, Elaine Smeltz L, PA-C 06/10/18 1849

## 2018-06-10 NOTE — ED Triage Notes (Signed)
[facility] a [facility] possible abscess on the left side of her lower back and on her right buttock.

## 2018-07-29 ENCOUNTER — Other Ambulatory Visit: Payer: Self-pay

## 2018-07-29 ENCOUNTER — Emergency Department (HOSPITAL_COMMUNITY): Payer: Self-pay | Admitting: Anesthesiology

## 2018-07-29 ENCOUNTER — Encounter (HOSPITAL_COMMUNITY): Payer: Self-pay | Admitting: Emergency Medicine

## 2018-07-29 ENCOUNTER — Encounter (HOSPITAL_COMMUNITY)
Admission: EM | Disposition: A | Discharge status: Home / Self Care | Payer: Self-pay | Source: Home / Self Care | Attending: Emergency Medicine

## 2018-07-29 ENCOUNTER — Emergency Department (HOSPITAL_COMMUNITY): Payer: Self-pay

## 2018-07-29 ENCOUNTER — Observation Stay (HOSPITAL_COMMUNITY)
Admission: EM | Admit: 2018-07-29 | Discharge: 2018-07-30 | Disposition: A | Discharge status: Home / Self Care | Payer: Self-pay | Attending: General Surgery | Admitting: General Surgery

## 2018-07-29 ENCOUNTER — Ambulatory Visit (HOSPITAL_COMMUNITY)
Admission: EM | Admit: 2018-07-29 | Discharge: 2018-07-29 | Disposition: A | Discharge status: Nursing Home (Includes ICF) | Payer: Self-pay

## 2018-07-29 ENCOUNTER — Encounter (HOSPITAL_COMMUNITY): Payer: Self-pay

## 2018-07-29 DIAGNOSIS — K381 Appendicular concretions: Secondary | ICD-10-CM | POA: Insufficient documentation

## 2018-07-29 DIAGNOSIS — F419 Anxiety disorder, unspecified: Secondary | ICD-10-CM | POA: Insufficient documentation

## 2018-07-29 DIAGNOSIS — Q796 Ehlers-Danlos syndrome: Secondary | ICD-10-CM | POA: Insufficient documentation

## 2018-07-29 DIAGNOSIS — Z881 Allergy status to other antibiotic agents status: Secondary | ICD-10-CM | POA: Insufficient documentation

## 2018-07-29 DIAGNOSIS — Z87891 Personal history of nicotine dependence: Secondary | ICD-10-CM | POA: Insufficient documentation

## 2018-07-29 DIAGNOSIS — L409 Psoriasis, unspecified: Secondary | ICD-10-CM | POA: Insufficient documentation

## 2018-07-29 DIAGNOSIS — Z885 Allergy status to narcotic agent status: Secondary | ICD-10-CM | POA: Insufficient documentation

## 2018-07-29 DIAGNOSIS — K353 Acute appendicitis with localized peritonitis, without perforation or gangrene: Principal | ICD-10-CM | POA: Insufficient documentation

## 2018-07-29 DIAGNOSIS — K37 Unspecified appendicitis: Secondary | ICD-10-CM | POA: Diagnosis present

## 2018-07-29 DIAGNOSIS — K589 Irritable bowel syndrome without diarrhea: Secondary | ICD-10-CM | POA: Insufficient documentation

## 2018-07-29 DIAGNOSIS — Z882 Allergy status to sulfonamides status: Secondary | ICD-10-CM | POA: Insufficient documentation

## 2018-07-29 DIAGNOSIS — K3580 Unspecified acute appendicitis: Secondary | ICD-10-CM

## 2018-07-29 DIAGNOSIS — M419 Scoliosis, unspecified: Secondary | ICD-10-CM | POA: Insufficient documentation

## 2018-07-29 DIAGNOSIS — R1031 Right lower quadrant pain: Secondary | ICD-10-CM

## 2018-07-29 HISTORY — PX: LAPAROSCOPIC APPENDECTOMY: SHX408

## 2018-07-29 LAB — URINALYSIS, ROUTINE W REFLEX MICROSCOPIC
Bilirubin Urine: NEGATIVE
GLUCOSE, UA: NEGATIVE mg/dL
Ketones, ur: NEGATIVE mg/dL
Leukocytes, UA: NEGATIVE
NITRITE: NEGATIVE
PH: 6 (ref 5.0–8.0)
PROTEIN: NEGATIVE mg/dL
SPECIFIC GRAVITY, URINE: 1.004 — AB (ref 1.005–1.030)

## 2018-07-29 LAB — COMPREHENSIVE METABOLIC PANEL
ALK PHOS: 55 U/L (ref 38–126)
ALT: 13 U/L (ref 0–44)
ANION GAP: 7 (ref 5–15)
AST: 16 U/L (ref 15–41)
Albumin: 3.8 g/dL (ref 3.5–5.0)
BUN: <5 mg/dL — ABNORMAL LOW (ref 6–20)
CALCIUM: 9.3 mg/dL (ref 8.9–10.3)
CO2: 30 mmol/L (ref 22–32)
CREATININE: 0.59 mg/dL (ref 0.44–1.00)
Chloride: 105 mmol/L (ref 98–111)
Glucose, Bld: 114 mg/dL — ABNORMAL HIGH (ref 70–99)
Potassium: 3.8 mmol/L (ref 3.5–5.1)
SODIUM: 142 mmol/L (ref 135–145)
TOTAL PROTEIN: 7 g/dL (ref 6.5–8.1)
Total Bilirubin: 0.8 mg/dL (ref 0.3–1.2)

## 2018-07-29 LAB — CBC WITH DIFFERENTIAL/PLATELET
ABS IMMATURE GRANULOCYTES: 0 10*3/uL (ref 0.0–0.1)
BASOS ABS: 0 10*3/uL (ref 0.0–0.1)
BASOS PCT: 0 %
EOS PCT: 2 %
Eosinophils Absolute: 0.2 10*3/uL (ref 0.0–0.7)
HCT: 39.5 % (ref 36.0–46.0)
HEMOGLOBIN: 12.6 g/dL (ref 12.0–15.0)
Immature Granulocytes: 0 %
LYMPHS PCT: 14 %
Lymphs Abs: 1.4 10*3/uL (ref 0.7–4.0)
MCH: 31.3 pg (ref 26.0–34.0)
MCHC: 31.9 g/dL (ref 30.0–36.0)
MCV: 98.3 fL (ref 78.0–100.0)
MONO ABS: 0.6 10*3/uL (ref 0.1–1.0)
MONOS PCT: 6 %
Neutro Abs: 7.9 10*3/uL — ABNORMAL HIGH (ref 1.7–7.7)
Neutrophils Relative %: 78 %
Platelets: 252 10*3/uL (ref 150–400)
RBC: 4.02 MIL/uL (ref 3.87–5.11)
RDW: 13.7 % (ref 11.5–15.5)
WBC: 10.2 10*3/uL (ref 4.0–10.5)

## 2018-07-29 LAB — PREGNANCY, URINE: Preg Test, Ur: NEGATIVE

## 2018-07-29 LAB — LIPASE, BLOOD: LIPASE: 29 U/L (ref 11–51)

## 2018-07-29 SURGERY — APPENDECTOMY, LAPAROSCOPIC
Anesthesia: General | Site: Abdomen

## 2018-07-29 MED ORDER — ONDANSETRON HCL 4 MG/2ML IJ SOLN
INTRAMUSCULAR | Status: DC | PRN
  Administered 2018-07-29: 4 mg via INTRAVENOUS

## 2018-07-29 MED ORDER — MORPHINE SULFATE (PF) 4 MG/ML IV SOLN
4.0000 mg | Freq: Once | INTRAVENOUS | Status: AC
  Administered 2018-07-29: 4 mg via INTRAVENOUS
  Filled 2018-07-29: qty 1

## 2018-07-29 MED ORDER — 0.9 % SODIUM CHLORIDE (POUR BTL) OPTIME
TOPICAL | Status: DC | PRN
  Administered 2018-07-29: 1000 mL

## 2018-07-29 MED ORDER — SODIUM CHLORIDE 0.9 % IR SOLN
Status: DC | PRN
  Administered 2018-07-29: 1000 mL

## 2018-07-29 MED ORDER — LIDOCAINE 2% (20 MG/ML) 5 ML SYRINGE
INTRAMUSCULAR | Status: AC
  Filled 2018-07-29: qty 5

## 2018-07-29 MED ORDER — SODIUM CHLORIDE 0.9 % IV SOLN
INTRAVENOUS | Status: DC
  Administered 2018-07-29: 23:00:00 via INTRAVENOUS

## 2018-07-29 MED ORDER — IOPAMIDOL (ISOVUE-300) INJECTION 61%
INTRAVENOUS | Status: AC
  Filled 2018-07-29: qty 100

## 2018-07-29 MED ORDER — FENTANYL CITRATE (PF) 250 MCG/5ML IJ SOLN
INTRAMUSCULAR | Status: AC
  Filled 2018-07-29: qty 5

## 2018-07-29 MED ORDER — IOPAMIDOL (ISOVUE-300) INJECTION 61%
100.0000 mL | Freq: Once | INTRAVENOUS | Status: AC | PRN
  Administered 2018-07-29: 100 mL via INTRAVENOUS

## 2018-07-29 MED ORDER — SUGAMMADEX SODIUM 200 MG/2ML IV SOLN
INTRAVENOUS | Status: DC | PRN
  Administered 2018-07-29: 120 mg via INTRAVENOUS

## 2018-07-29 MED ORDER — LIDOCAINE HCL (CARDIAC) PF 100 MG/5ML IV SOSY
PREFILLED_SYRINGE | INTRAVENOUS | Status: DC | PRN
  Administered 2018-07-29: 40 mg via INTRATRACHEAL

## 2018-07-29 MED ORDER — LACTATED RINGERS IV SOLN
INTRAVENOUS | Status: DC | PRN
  Administered 2018-07-29: 22:00:00 via INTRAVENOUS

## 2018-07-29 MED ORDER — PROPOFOL 10 MG/ML IV BOLUS
INTRAVENOUS | Status: AC
  Filled 2018-07-29: qty 20

## 2018-07-29 MED ORDER — ROCURONIUM BROMIDE 100 MG/10ML IV SOLN
INTRAVENOUS | Status: DC | PRN
  Administered 2018-07-29: 40 mg via INTRAVENOUS

## 2018-07-29 MED ORDER — BUPIVACAINE-EPINEPHRINE (PF) 0.25% -1:200000 IJ SOLN
INTRAMUSCULAR | Status: AC
  Filled 2018-07-29: qty 30

## 2018-07-29 MED ORDER — METRONIDAZOLE IN NACL 5-0.79 MG/ML-% IV SOLN
500.0000 mg | Freq: Once | INTRAVENOUS | Status: AC
  Administered 2018-07-29: 500 mg via INTRAVENOUS
  Filled 2018-07-29: qty 100

## 2018-07-29 MED ORDER — PROMETHAZINE HCL 25 MG/ML IJ SOLN: 6.2500 mg | INTRAMUSCULAR | Status: DC | PRN

## 2018-07-29 MED ORDER — DEXAMETHASONE SODIUM PHOSPHATE 10 MG/ML IJ SOLN
INTRAMUSCULAR | Status: DC | PRN
  Administered 2018-07-29: 10 mg via INTRAVENOUS

## 2018-07-29 MED ORDER — FENTANYL CITRATE (PF) 250 MCG/5ML IJ SOLN
INTRAMUSCULAR | Status: DC | PRN
  Administered 2018-07-29 (×2): 50 ug via INTRAVENOUS
  Administered 2018-07-29: 100 ug via INTRAVENOUS
  Administered 2018-07-29: 50 ug via INTRAVENOUS

## 2018-07-29 MED ORDER — ONDANSETRON 4 MG PO TBDP
4.0000 mg | ORAL_TABLET | Freq: Once | ORAL | Status: AC
  Administered 2018-07-29: 4 mg via ORAL
  Filled 2018-07-29: qty 1

## 2018-07-29 MED ORDER — BUPIVACAINE-EPINEPHRINE 0.25% -1:200000 IJ SOLN
INTRAMUSCULAR | Status: DC | PRN
  Administered 2018-07-29: 7 mL

## 2018-07-29 MED ORDER — SODIUM CHLORIDE 0.9 % IV SOLN
INTRAVENOUS | Status: DC | PRN
  Administered 2018-07-29: 22:00:00 via INTRAVENOUS

## 2018-07-29 MED ORDER — MIDAZOLAM HCL 2 MG/2ML IJ SOLN
INTRAMUSCULAR | Status: DC | PRN
  Administered 2018-07-29: 2 mg via INTRAVENOUS

## 2018-07-29 MED ORDER — ROCURONIUM BROMIDE 50 MG/5ML IV SOSY
PREFILLED_SYRINGE | INTRAVENOUS | Status: AC
  Filled 2018-07-29: qty 5

## 2018-07-29 MED ORDER — SUCCINYLCHOLINE CHLORIDE 200 MG/10ML IV SOSY
PREFILLED_SYRINGE | INTRAVENOUS | Status: AC
  Filled 2018-07-29: qty 10

## 2018-07-29 MED ORDER — PROPOFOL 10 MG/ML IV BOLUS
INTRAVENOUS | Status: DC | PRN
  Administered 2018-07-29: 160 mg via INTRAVENOUS

## 2018-07-29 MED ORDER — MIDAZOLAM HCL 2 MG/2ML IJ SOLN
INTRAMUSCULAR | Status: AC
  Filled 2018-07-29: qty 2

## 2018-07-29 MED ORDER — SODIUM CHLORIDE 0.9 % IV SOLN
2.0000 g | Freq: Once | INTRAVENOUS | Status: AC
  Administered 2018-07-29: 2 g via INTRAVENOUS
  Filled 2018-07-29: qty 20

## 2018-07-29 SURGICAL SUPPLY — 39 items
APPLIER CLIP ROT 10 11.4 M/L (STAPLE)
CANISTER SUCT 3000ML PPV (MISCELLANEOUS) ×2 IMPLANT
CHLORAPREP W/TINT 26ML (MISCELLANEOUS) ×2 IMPLANT
CLIP APPLIE ROT 10 11.4 M/L (STAPLE) IMPLANT
COVER SURGICAL LIGHT HANDLE (MISCELLANEOUS) ×2 IMPLANT
CUTTER FLEX LINEAR 45M (STAPLE) ×2 IMPLANT
DERMABOND ADVANCED (GAUZE/BANDAGES/DRESSINGS) ×1
DERMABOND ADVANCED .7 DNX12 (GAUZE/BANDAGES/DRESSINGS) ×1 IMPLANT
ELECT REM PT RETURN 9FT ADLT (ELECTROSURGICAL) ×2
ELECTRODE REM PT RTRN 9FT ADLT (ELECTROSURGICAL) ×1 IMPLANT
GLOVE BIO SURGEON STRL SZ7 (GLOVE) ×2 IMPLANT
GLOVE BIOGEL PI IND STRL 7.5 (GLOVE) ×1 IMPLANT
GLOVE BIOGEL PI INDICATOR 7.5 (GLOVE) ×1
GOWN STRL REUS W/ TWL LRG LVL3 (GOWN DISPOSABLE) ×3 IMPLANT
GOWN STRL REUS W/TWL LRG LVL3 (GOWN DISPOSABLE) ×3
GRASPER SUT TROCAR 14GX15 (MISCELLANEOUS) ×2 IMPLANT
KIT BASIN OR (CUSTOM PROCEDURE TRAY) ×2 IMPLANT
KIT TURNOVER KIT B (KITS) ×2 IMPLANT
NS IRRIG 1000ML POUR BTL (IV SOLUTION) ×2 IMPLANT
PAD ARMBOARD 7.5X6 YLW CONV (MISCELLANEOUS) ×4 IMPLANT
POUCH RETRIEVAL ECOSAC 10 (ENDOMECHANICALS) ×1 IMPLANT
POUCH RETRIEVAL ECOSAC 10MM (ENDOMECHANICALS) ×1
RELOAD 45 VASCULAR/THIN (ENDOMECHANICALS) IMPLANT
RELOAD STAPLE TA45 3.5 REG BLU (ENDOMECHANICALS) ×2 IMPLANT
SCISSORS LAP 5X35 DISP (ENDOMECHANICALS) ×2 IMPLANT
SET IRRIG TUBING LAPAROSCOPIC (IRRIGATION / IRRIGATOR) ×2 IMPLANT
SHEARS HARMONIC ACE PLUS 36CM (ENDOMECHANICALS) ×2 IMPLANT
SLEEVE ENDOPATH XCEL 5M (ENDOMECHANICALS) ×2 IMPLANT
SPECIMEN JAR SMALL (MISCELLANEOUS) ×2 IMPLANT
STRIP CLOSURE SKIN 1/2X4 (GAUZE/BANDAGES/DRESSINGS) ×2 IMPLANT
SUT MNCRL AB 4-0 PS2 18 (SUTURE) ×2 IMPLANT
SUT VICRYL 0 UR6 27IN ABS (SUTURE) ×4 IMPLANT
TOWEL OR 17X24 6PK STRL BLUE (TOWEL DISPOSABLE) ×2 IMPLANT
TOWEL OR 17X26 10 PK STRL BLUE (TOWEL DISPOSABLE) ×2 IMPLANT
TRAY LAPAROSCOPIC MC (CUSTOM PROCEDURE TRAY) ×2 IMPLANT
TROCAR XCEL BLUNT TIP 100MML (ENDOMECHANICALS) ×2 IMPLANT
TROCAR XCEL NON-BLD 5MMX100MML (ENDOMECHANICALS) ×2 IMPLANT
TUBING INSUFFLATION (TUBING) ×2 IMPLANT
WATER STERILE IRR 1000ML POUR (IV SOLUTION) ×2 IMPLANT

## 2018-07-29 NOTE — ED Provider Notes (Signed)
[facility] Provider Note   CSN: [id number] Arrival date & time: 07/29/18  1701     History   Chief Complaint Chief Complaint  Patient presents with  . Abdominal Pain    HPI Elaine [name] is a 37 y.o. female.  HPI    37 year old female presents today with complaints of abdominal pain.  Patient notes symptoms started slowly yesterday and have gradually worsened.  She notes they feel crampy and sharp.  She notes they are located in the right lower quadrant without radiation of symptoms.  She denies any nausea or vomiting, reports she has IBS and usually has diarrhea, no change to her baseline.  She denies any vaginal discharge notes she just recently came off her menstrual cycle, no urinary complaints.  Patient reports a surgical history of cholecystectomy.  She denies fever.   Past Medical History:  Diagnosis Date  . Anorexia   . Anxiety   . Dysrhythmia   . Gall stones   . Headache   . Hypermobility of joint    Ehler Danlos Syndrome  . IBS (irritable bowel syndrome)   . Infection    bladder  . Jaundice   . Mastoiditis   . Migraine   . Morton neuroma   . Ovarian cyst   . Pancreatitis   . Psoriasis   . Psoriasis   . Scoliosis     There are no active problems to display for this patient.   Past Surgical History:  Procedure Laterality Date  . DILATION AND CURETTAGE OF UTERUS  2002  . LAPAROSCOPIC CHOLECYSTECTOMY  2006     OB History    Gravida  1   Para      Term      Preterm      AB  1   Living        SAB  1   TAB      Ectopic      Multiple      Live Births               Home Medications    Prior to Admission medications   Medication Sig Start Date End Date Taking? Authorizing Provider  omeprazole (PRILOSEC) 20 MG capsule Take 20 mg by mouth daily.    [provider]    Family History Family History  Problem Relation Age of Onset  . Heart disease Father   . Cancer Father      skin  . Osteoarthritis Father     Social History Social History   Tobacco Use  . Smoking status: Former Smoker    Packs/day: 1.00    Types: Cigarettes  . Smokeless tobacco: Never Used  Substance Use Topics  . Alcohol use: No  . Drug use: No     Allergies   Sulfa antibiotics; Ciprofloxacin; and Morphine and related   Review of Systems Review of Systems  All other systems reviewed and are negative.  Physical Exam Updated Vital Signs BP 122/85   Pulse 71   Temp 99.4 F (37.4 C) (Oral)   Resp 16   Ht 5\' 9"  (1.753 m)   Wt 54.4 kg   LMP 07/28/2018   SpO2 96%   BMI 17.72 kg/m   Physical Exam  Constitutional: She is oriented to person, place, and time. She appears well-developed and well-nourished.  HENT:  Head: Normocephalic and atraumatic.  Eyes: Pupils are equal, round, and reactive to light. Conjunctivae are normal. Right eye  exhibits no discharge. Left eye exhibits no discharge. No scleral icterus.  Neck: Normal range of motion. No JVD present. No tracheal deviation present.  Pulmonary/Chest: Effort normal. No stridor.  Abdominal:  Exquisite tenderness to palpation her right lower quadrant, remainder of abdomen soft nontender no masses  Neurological: She is alert and oriented to person, place, and time. Coordination normal.  Psychiatric: She has a normal mood and affect. Her behavior is normal. Judgment and thought content normal.  Nursing note and vitals reviewed.    ED Treatments / Results  Labs (all labs ordered are listed, but only abnormal results are displayed) Labs Reviewed  CBC WITH DIFFERENTIAL/PLATELET - Abnormal; Notable for the following components:      Result Value   Neutro Abs 7.9 (*)    All other components within normal limits  COMPREHENSIVE METABOLIC PANEL - Abnormal; Notable for the following components:   Glucose, Bld 114 (*)    BUN <5 (*)    All other components within normal limits  URINALYSIS, ROUTINE W REFLEX MICROSCOPIC -  Abnormal; Notable for the following components:   Color, Urine STRAW (*)    Specific Gravity, Urine 1.004 (*)    Hgb urine dipstick SMALL (*)    Bacteria, UA RARE (*)    All other components within normal limits  LIPASE, BLOOD  PREGNANCY, URINE    EKG None  Radiology Ct Abdomen Pelvis W Contrast  Result Date: 07/29/2018 CLINICAL DATA:  Sharp RIGHT lower quadrant pain question appendicitis EXAM: CT ABDOMEN AND PELVIS WITH CONTRAST TECHNIQUE: Multidetector CT imaging of the abdomen and pelvis was performed using the standard protocol following bolus administration of intravenous contrast. Sagittal and coronal MPR images reconstructed from axial data set. CONTRAST:  100mL ISOVUE-300 IOPAMIDOL (ISOVUE-300) INJECTION 61% IV. No oral contrast. COMPARISON:  08/24/2017 FINDINGS: Lower chest: Lung bases clear Hepatobiliary: Gallbladder surgically absent. Subcapsular lesion at anterior RIGHT lobe liver 10 x 7 x 7 mm with nodular enhancement best visualized on sagittal imaging likely a tiny hemangioma. Delayed images were not obtained. Remainder of liver unremarkable. Pancreas: Normal appearance Spleen: Normal appearance Adrenals/Urinary Tract: Adrenal glands, kidneys, and bladder normal appearance. Ureters poorly localized. Stomach/Bowel: Thickened appendix with periappendiceal inflammatory changes consistent with acute appendicitis. Probable medication tablet within duodenum. Stomach and remaining bowel loops unremarkable. Appendix: Location: Extending inferior from cecum in RIGHT pelvis Diameter: 14 mm Appendicolith: 6 x 3 mm of appendicolith at base of appendix. Tiny additional appendicolith Mucosal hyper-enhancement: Present Extraluminal gas: Not identified Periappendiceal collection: Free fluid without focal defined collection Vascular/Lymphatic: Vascular structures unremarkable. No adenopathy. Reproductive: Question nabothian cyst at cervix otherwise unremarkable. Other: No free air or hernia.  Musculoskeletal: Unremarkable IMPRESSION: Acute appendicitis as above with adjacent free fluid in RIGHT pelvis but no defined focal abscess collection or free intraperitoneal air identified. Suspect small 10 mm hemangioma of liver. Electronically Signed   By: Ulyses SouthwardMark  Boles M.D.   On: 07/29/2018 19:51    Procedures Procedures (including critical care time)  Medications Ordered in ED Medications  iopamidol (ISOVUE-300) 61 % injection (has no administration in time range)  cefTRIAXone (ROCEPHIN) 2 g in sodium chloride 0.9 % 100 mL IVPB (2 g Intravenous New Bag/Given 07/29/18 2112)    And  metroNIDAZOLE (FLAGYL) IVPB 500 mg (has no administration in time range)  ondansetron (ZOFRAN-ODT) disintegrating tablet 4 mg (4 mg Oral Given 07/29/18 1721)  morphine 4 MG/ML injection 4 mg (4 mg Intravenous Given 07/29/18 1834)  iopamidol (ISOVUE-300) 61 % injection 100 mL (100  mLs Intravenous Contrast Given 07/29/18 1924)     Initial Impression / Assessment and Plan / ED Course  I have reviewed the triage vital signs and the nursing notes.  Pertinent labs & imaging results that were available during my care of the patient were reviewed by me and considered in my medical decision making (see chart for details).     Labs: CBC, CMP, lipase, urinalysis, urine drug  Imaging: ED abdomen pelvis with contrast  Consults: General surgery  Therapeutics: Ceftriaxone, metronidazole, morphine  Discharge Meds:   Assessment/Plan: 37 year old female presents today with acute appendicitis, no signs of severe systemic illness including tachycardia elevated white count or fever.  She will be started on metronidazole and ceftriaxone.  General surgery consulted who would evaluate patient here in the ED.      Final Clinical Impressions(s) / ED Diagnoses   Final diagnoses:  Acute appendicitis, unspecified acute appendicitis type    ED Discharge Orders    None       Rosalio Loud 07/29/18 2113      Virgina Norfolk, DO 07/29/18 2157

## 2018-07-29 NOTE — ED Notes (Signed)
Pt taken to CT.

## 2018-07-29 NOTE — Op Note (Signed)
Preoperative diagnosis: Acute appendicitis Postoperative diagnosis: Same as above Procedure: Laparoscopic appendectomy Surgeon: Dr. Harden MoMatt Mozetta Murfin Anesthesia: General Estimated blood loss: Minimal Complications: None Specimens: Appendix to pathology Sponge and count was correct at completion Disposition to recovery in stable condition  Indications: This is a 37 year old female who has right lower quadrant pain and a CT scan it looks like she has acute appendicitis.  Her symptoms have been present less than 24 hours.  I discussed proceeding to the operating room for a laparoscopic appendectomy.  Procedure: After informed consent was obtained the patient was taken to the operating room.  She was given antibiotics.  SCDs were in place.  She had voided prior to going to the operating room.  She was placed under general anesthesia without complication.  She was prepped and draped in the standard sterile surgical fashion.  A surgical timeout was then performed.  I infiltrated Marcaine in her prior vertical incision from her cholecystectomy.  I then reentered this incision.  She had a lot of scar tissue and I dissected down to her fascia which I incised sharply.  I entered her peritoneum sharply as well.  This was done without injury.  She did appear to have a small umbilical hernia at this site already.  I then placed a 0 Vicryl pursestring suture.  I inserted a Hassan trocar.  I insufflated the abdomen to 15 mmHg pressure.  I then inserted 2 further 5 mm trochars in the left lower quadrant and suprapubic area.  Her omentum was adherent to an anterior abdominal wall.  I peeled this off.  The appendix was noted to be acutely inflamed.  There was some fluid present but there was no perforation.  I identified the terminal ileum as well as the cecum.  I used a harmonic to remove some of the omentum that was stuck to the appendix.  I then was able to trace the appendix to its base.  I divided the appendiceal  mesentery with the harmonic scalpel.  I then used a GIA stapler to divide the appendix with a small portion of the cecum on it as it was inflamed all the way to the base.  I then placed this in a bag and removed from the umbilicus.  I then obtained hemostasis.  All of the area looked viable and there was no operative injury.  I then remove the [facility].  I tied my pursestring down.  I placed an additional 4-0 Vicryl sutures using the suture passer to completely obliterate this defect and closed what I think was a small hernia to begin with.  I then remove the remaining trochars.  The incisions were then closed with 4-0 Monocryl and glue.  Steri-Strips were placed.  She tolerated this well was extubated and transferred to recovery stable.

## 2018-07-29 NOTE — Discharge Instructions (Signed)
[facility] LAPAROSCOPIC SURGERY: POST OP INSTRUCTIONS  Always review your discharge instruction sheet given to you by the facility where your surgery was performed. IF YOU HAVE DISABILITY OR FAMILY LEAVE FORMS, YOU MUST BRING THEM TO THE OFFICE FOR PROCESSING.   DO NOT GIVE THEM TO YOUR DOCTOR.  1. A prescription for pain medication may be given to you upon discharge.  Take your pain medication as prescribed, if needed.  If narcotic pain medicine is not needed, then you may take acetaminophen (Tylenol), naprosyn (Alleve), or ibuprofen (Advil) as needed. 2. Take your usually prescribed medications unless otherwise directed. 3. If you need a refill on your pain medication, please contact your pharmacy.  They will contact our office to request authorization. Prescriptions will not be filled after 5pm or on week-ends. 4. You should follow a light diet the first few days after arrival home, such as soup and crackers, etc.  Be sure to include lots of fluids daily. 5. Most patients will experience some swelling and bruising in the area of the incisions.  Ice packs will help.  Swelling and bruising can take several days to resolve.  6. It is common to experience some constipation if taking pain medication after surgery.  Increasing fluid intake and taking a stool softener (such as Colace) will usually help or prevent this problem from occurring.  A mild laxative (Milk of Magnesia or Miralax) should be taken according to package instructions if there are no bowel movements after 48 hours. 7. Unless discharge instructions indicate otherwise, you may remove your bandages 48 hours after surgery, and you may shower at that time.  You may have steri-strips (small skin tapes) in place directly over the incision.  These strips should be left on the skin for 7-10 days.  If your surgeon used skin glue on the incision, you may shower in 24 hours.  The glue will flake  off over the next 2-3 weeks.  Any sutures or staples will be removed at the office during your follow-up visit. 8. ACTIVITIES:  You may resume regular (light) daily activities beginning the next day--such as daily self-care, walking, climbing stairs--gradually increasing activities as tolerated.  You may have sexual intercourse when it is comfortable.  Refrain from any heavy lifting or straining until approved by your doctor. a. You may drive when you are no longer taking prescription pain medication, you can comfortably wear a seatbelt, and you can safely maneuver your car and apply brakes. b. RETURN TO WORK:  __________________________________________________________ 9. You should see your doctor in the office for a follow-up appointment approximately 2-3 weeks after your surgery.  Make sure that you call for this appointment within a day or two after you arrive home to insure a convenient appointment time. 10. OTHER INSTRUCTIONS: __________________________________________________________________________________________________________________________ __________________________________________________________________________________________________________________________ WHEN TO CALL YOUR DOCTOR: 1. Fever over 101.0 2. Inability to urinate 3. Continued bleeding from incision. 4. Increased pain, redness, or drainage from the incision. 5. Increasing abdominal pain  The clinic staff is available to answer your questions during regular business hours.  Please don't hesitate to call and ask to speak to one of the nurses for clinical concerns.  If you have a medical emergency, go to the nearest emergency room or call 911.  A surgeon from [facility] is always on call at the hospital. [address], [address], [place], [place]  [address] ? [address] [address], [place], [place]   [address] [phone] ? [phone] ? [phone]   [phone] Web site: [email or web address]  

## 2018-07-29 NOTE — H&P (Signed)
Elaine [name] is an 37 y.o. female.   Chief Complaint: abd pain HPI: 74 yof with less than 24 hours of rlq pain that has been getting worse. Nothing making better. No fever. Some nausea, no emesis, never had before.  No issues with urination. Has prior history of lap chole for gallstones and had pancreatitis then.  She has been on prilosec and pepcid for reflux and possible ulcers but never had evaluated really.  She does not work.  Movement makes pain worse, is dull constant when lying down.   Past Medical History:  Diagnosis Date  . Anorexia   . Anxiety   . Dysrhythmia   . Gall stones   . Headache   . Hypermobility of joint    Ehler Danlos Syndrome  . IBS (irritable bowel syndrome)   . Infection    bladder  . Jaundice   . Mastoiditis   . Migraine   . Morton neuroma   . Ovarian cyst   . Pancreatitis   . Psoriasis   . Psoriasis   . Scoliosis     Past Surgical History:  Procedure Laterality Date  . DILATION AND CURETTAGE OF UTERUS  2002  . LAPAROSCOPIC CHOLECYSTECTOMY  2006    Family History  Problem Relation Age of Onset  . Heart disease Father   . Cancer Father        skin  . Osteoarthritis Father    Social History:  reports that she has quit smoking. Her smoking use included cigarettes. She smoked 1.00 pack per day. She has never used smokeless tobacco. She reports that she does not drink alcohol or use drugs.  Allergies:  Allergies  Allergen Reactions  . Sulfa Antibiotics Nausea Only  . Ciprofloxacin   . Morphine And Related Nausea And Vomiting and Other (See Comments)    Chest pain    meds reviewed  Results for orders placed or performed during the hospital encounter of 07/29/18 (from the past 48 hour(s))  CBC with Differential     Status: Abnormal   Collection Time: 07/29/18  5:17 PM  Result Value Ref Range   WBC 10.2 4.0 - 10.5 K/uL   RBC 4.02 3.87 - 5.11 MIL/uL   Hemoglobin 12.6 12.0 - 15.0 g/dL   HCT 39.5 36.0 - 46.0 %   MCV 98.3 78.0 - 100.0 fL   MCH 31.3 26.0 - 34.0 pg   MCHC 31.9 30.0 - 36.0 g/dL   RDW 13.7 11.5 - 15.5 %   Platelets 252 150 - 400 K/uL   Neutrophils Relative % 78 %   Neutro Abs 7.9 (H) 1.7 - 7.7 K/uL   Lymphocytes Relative 14 %   Lymphs Abs 1.4 0.7 - 4.0 K/uL   Monocytes Relative 6 %   Monocytes Absolute 0.6 0.1 - 1.0 K/uL   Eosinophils Relative 2 %   Eosinophils Absolute 0.2 0.0 - 0.7 K/uL   Basophils Relative 0 %   Basophils Absolute 0.0 0.0 - 0.1 K/uL   Immature Granulocytes 0 %   Abs Immature Granulocytes 0.0 0.0 - 0.1 K/uL    Comment: Performed at [facility], [address], [place], [place] [address]  Comprehensive metabolic panel     Status: Abnormal   Collection Time: 07/29/18  5:17 PM  Result Value Ref Range   Sodium 142 135 - 145 mmol/L   Potassium 3.8 3.5 - 5.1 mmol/L   Chloride 105 98 - 111 mmol/L   CO2 30 22 - 32 mmol/L  Glucose, Bld 114 (H) 70 - 99 mg/dL   BUN <5 (L) 6 - 20 mg/dL   Creatinine, Ser 0.59 0.44 - 1.00 mg/dL   Calcium 9.3 8.9 - 10.3 mg/dL   Total Protein 7.0 6.5 - 8.1 g/dL   Albumin 3.8 3.5 - 5.0 g/dL   AST 16 15 - 41 U/L   ALT 13 0 - 44 U/L   Alkaline Phosphatase 55 38 - 126 U/L   Total Bilirubin 0.8 0.3 - 1.2 mg/dL   GFR calc non Af Amer >60 >60 mL/min   GFR calc Af Amer >60 >60 mL/min    Comment: (NOTE) The eGFR has been calculated using the CKD EPI equation. This calculation has not been validated in all clinical situations. eGFR's persistently <60 mL/min signify possible Chronic Kidney Disease.    Anion gap 7 5 - 15    Comment: Performed at [facility] [address] [place], [place] [address]  Lipase, blood     Status: None   Collection Time: 07/29/18  5:17 PM  Result Value Ref Range   Lipase 29 11 - 51 U/L    Comment: Performed at [facility] [address] [place], [place] [address]  Urinalysis, Routine w reflex microscopic     Status: Abnormal   Collection Time: 07/29/18  5:31 PM  Result Value Ref Range   Color, Urine STRAW (A)  YELLOW   APPearance CLEAR CLEAR   Specific Gravity, Urine 1.004 (L) 1.005 - 1.030   pH 6.0 5.0 - 8.0   Glucose, UA NEGATIVE NEGATIVE mg/dL   Hgb urine dipstick SMALL (A) NEGATIVE   Bilirubin Urine NEGATIVE NEGATIVE   Ketones, ur NEGATIVE NEGATIVE mg/dL   Protein, ur NEGATIVE NEGATIVE mg/dL   Nitrite NEGATIVE NEGATIVE   Leukocytes, UA NEGATIVE NEGATIVE   RBC / HPF 0-5 0 - 5 RBC/hpf   WBC, UA 0-5 0 - 5 WBC/hpf   Bacteria, UA RARE (A) NONE SEEN   Squamous Epithelial / LPF 0-5 0 - 5   Mucus PRESENT     Comment: Performed at [facility], [place] [address] [place], [place] [address]  Pregnancy, urine     Status: None   Collection Time: 07/29/18  5:31 PM  Result Value Ref Range   Preg Test, Ur NEGATIVE NEGATIVE    Comment:        THE SENSITIVITY OF THIS METHODOLOGY IS >20 mIU/mL. Performed at [facility], [place] [address] [place], [place] [address]    Ct Abdomen Pelvis W Contrast  Result Date: 07/29/2018 CLINICAL DATA:  Hervey Ard RIGHT lower quadrant pain question appendicitis EXAM: CT ABDOMEN AND PELVIS WITH CONTRAST TECHNIQUE: Multidetector CT imaging of the abdomen and pelvis was performed using the standard protocol following bolus administration of intravenous contrast. Sagittal and coronal MPR images reconstructed from axial data set. CONTRAST:  131m ISOVUE-300 IOPAMIDOL (ISOVUE-300) INJECTION 61% IV. No oral contrast. COMPARISON:  08/24/2017 FINDINGS: Lower chest: Lung bases clear Hepatobiliary: Gallbladder surgically absent. Subcapsular lesion at anterior RIGHT lobe liver 10 x 7 x 7 mm with nodular enhancement best visualized on sagittal imaging likely a tiny hemangioma. Delayed images were not obtained. Remainder of liver unremarkable. Pancreas: Normal appearance Spleen: Normal appearance Adrenals/Urinary Tract: Adrenal glands, kidneys, and bladder normal appearance. Ureters poorly localized. Stomach/Bowel: Thickened appendix with periappendiceal inflammatory changes consistent  with acute appendicitis. Probable medication tablet within duodenum. Stomach and remaining bowel loops unremarkable. Appendix: Location: Extending inferior from cecum in RIGHT  pelvis Diameter: 14 mm Appendicolith: 6 x 3 mm of appendicolith at base of appendix. Tiny additional appendicolith Mucosal hyper-enhancement: Present Extraluminal gas: Not identified Periappendiceal collection: Free fluid without focal defined collection Vascular/Lymphatic: Vascular structures unremarkable. No adenopathy. Reproductive: Question nabothian cyst at cervix otherwise unremarkable. Other: No free air or hernia. Musculoskeletal: Unremarkable IMPRESSION: Acute appendicitis as above with adjacent free fluid in RIGHT pelvis but no defined focal abscess collection or free intraperitoneal air identified. Suspect small 10 mm hemangioma of liver. Electronically Signed   By: Lavonia Dana M.D.   On: 07/29/2018 19:51    Review of Systems  Gastrointestinal: Positive for abdominal pain and nausea.  All other systems reviewed and are negative.   Blood pressure 122/85, pulse 71, temperature 99.4 F (37.4 C), temperature source Oral, resp. rate 16, height '5\' 9"'$  (1.753 m), weight 54.4 kg, last menstrual period 07/28/2018, SpO2 96 %. Physical Exam  Vitals reviewed. Constitutional: She is oriented to person, place, and time. She appears well-developed and well-nourished.  HENT:  Head: Normocephalic and atraumatic.  Right Ear: External ear normal.  Left Ear: External ear normal.  Eyes: No scleral icterus.  Neck: Neck supple.  Cardiovascular: Normal rate.  Respiratory: Effort normal.  GI: Soft. She exhibits no distension. There is tenderness in the right lower quadrant. There is tenderness at McBurney's point. No hernia.    Musculoskeletal: Normal range of motion.  Neurological: She is alert and oriented to person, place, and time.  Skin: Skin is warm and dry.  Psychiatric: She has a normal mood and affect. Her behavior is  normal.     Assessment/Plan Appendicitis  Appears to have by time course,wbc and ct (even though has some fluid) to have early appendicitis. I discussed options including nonoperative. We have elected to proceed with lap appy. Discussed surgery and risks associated with it. Will proceed as soon as or available.  Rolm Bookbinder, MD 07/29/2018, 8:52 PM

## 2018-07-29 NOTE — ED Triage Notes (Signed)
Pt report RLQ pain x1 day. Pt reports nausea, no vomiting.

## 2018-07-29 NOTE — ED Provider Notes (Signed)
[facility]    CSN: [id number] Arrival date & time: 07/29/18  1554     History   Chief Complaint Chief Complaint  Patient presents with  . Abdominal Pain    HPI Elaine [name] is a 37 y.o. female.   Subjective:   Elaine [name] is a 37 y.o. female who presents for evaluation of abdominal pain. The pain is described as pressure-like and sharp. The pain is located in the RLQ without radiation. Pain started abruptly last night and symptoms have been gradually worsening since. The pain is constant with varying degrees of severity. Aggravating factors: movement.  Alleviating factors: none. Associated symptoms: anorexia and nausea. The patient denies constipation, diarrhea, dysuria, fever, frequency, sweats and vomiting.  The following portions of the patient's history were reviewed and updated as appropriate: allergies, current medications, past family history, past medical history, past social history, past surgical history and problem list.       Past Medical History:  Diagnosis Date  . Anorexia   . Anxiety   . Dysrhythmia   . Gall stones   . Headache   . Hypermobility of joint    Ehler Danlos Syndrome  . IBS (irritable bowel syndrome)   . Infection    bladder  . Jaundice   . Mastoiditis   . Migraine   . Morton neuroma   . Ovarian cyst   . Pancreatitis   . Psoriasis   . Psoriasis   . Scoliosis     There are no active problems to display for this patient.   Past Surgical History:  Procedure Laterality Date  . DILATION AND CURETTAGE OF UTERUS  2002  . LAPAROSCOPIC CHOLECYSTECTOMY  2006    OB History    Gravida  1   Para      Term      Preterm      AB  1   Living        SAB  1   TAB      Ectopic      Multiple      Live Births               Home Medications    Prior to Admission medications   Medication Sig Start Date End Date Taking? Authorizing Provider  omeprazole (PRILOSEC) 20 MG capsule Take 20 mg by mouth daily.     [provider]    Family History Family History  Problem Relation Age of Onset  . Heart disease Father   . Cancer Father        skin  . Osteoarthritis Father     Social History Social History   Tobacco Use  . Smoking status: Former Smoker    Packs/day: 1.00    Types: Cigarettes  . Smokeless tobacco: Never Used  Substance Use Topics  . Alcohol use: No  . Drug use: No     Allergies   Sulfa antibiotics; Ciprofloxacin; and Morphine and related   Review of Systems Review of Systems  Constitutional: Negative for appetite change and fever.  Gastrointestinal: Positive for abdominal pain and nausea. Negative for constipation, diarrhea and vomiting.  Genitourinary: Negative for dysuria and frequency.  All other systems reviewed and are negative.    Physical Exam Triage Vital Signs ED Triage Vitals  Enc Vitals Group     BP 07/29/18 1608 (!) 137/97     Pulse Rate 07/29/18 1608 86     Resp 07/29/18 1608 16  Temp 07/29/18 1608 98.6 F (37 C)     Temp Source 07/29/18 1608 Oral     SpO2 07/29/18 1608 100 %     Weight 07/29/18 1609 120 lb (54.4 kg)     Height --      Head Circumference --      Peak Flow --      Pain Score 07/29/18 1609 8     Pain Loc --      Pain Edu? --      Excl. in GC? --    No data found.  Updated Vital Signs BP (!) 137/97   Pulse 86   Temp 98.6 F (37 C) (Oral)   Resp 16   Wt 120 lb (54.4 kg)   LMP 07/28/2018   SpO2 100%   BMI 17.72 kg/m   Visual Acuity Right Eye Distance:   Left Eye Distance:   Bilateral Distance:    Right Eye Near:   Left Eye Near:    Bilateral Near:     Physical Exam  Constitutional: She is oriented to person, place, and time. She appears well-developed and well-nourished.  Non-toxic appearance.  Neck: Normal range of motion. Neck supple.  Cardiovascular: Normal rate and regular rhythm.  Pulmonary/Chest: Effort normal.  Abdominal: Normal appearance and bowel sounds are normal. She exhibits no  mass. There is tenderness in the right lower quadrant. There is guarding and tenderness at McBurney's point. There is no rigidity and no CVA tenderness.  Musculoskeletal: Normal range of motion.  Neurological: She is alert and oriented to person, place, and time.  Skin: Skin is warm and dry.  Psychiatric: She has a normal mood and affect.     UC Treatments / Results  Labs (all labs ordered are listed, but only abnormal results are displayed) Labs Reviewed - No data to display  EKG None  Radiology No results found.  Procedures Procedures (including critical care time)  Medications Ordered in UC Medications - No data to display  Initial Impression / Assessment and Plan / UC Course  I have reviewed the triage vital signs and the nursing notes.  Pertinent labs & imaging results that were available during my care of the patient were reviewed by me and considered in my medical decision making (see chart for details).    37 yo female presenting with RLQ abdominal pain that has been worsening since its onset last night. Patient in non-toxic appearing but very uncomfortable. Afebrile. VSS. Presenting symptoms and physical exam concerning for acute appendicitis. Patient referred to ED for urgent surgical consultation.   Documentation was completed with the aid of voice recognition software. Transcription may contain typographical errors. Final Clinical Impressions(s) / UC Diagnoses   Final diagnoses:  Right lower quadrant abdominal pain     Discharge Instructions     Presenting symptoms and physical exam concerning for acute appendicitis. Go directly to the ED for further evaluation.     ED Prescriptions    None     Controlled Substance Prescriptions [place] Controlled Substance Registry consulted? Not Applicable   Lurline IdolMurrill, Jaydenn Boccio, [place] 07/29/18 [phone]

## 2018-07-29 NOTE — Discharge Instructions (Addendum)
Presenting symptoms and physical exam concerning for acute appendicitis. Go directly to the ED for further evaluation.

## 2018-07-29 NOTE — Anesthesia Procedure Notes (Addendum)
Date/Time: 07/29/2018 9:58 PM Performed by: Molli HazardGordon, Johnchristopher Sarvis M, CRNA Pre-anesthesia Checklist: Patient identified, Emergency Drugs available, Suction available and Patient being monitored Patient Re-evaluated:Patient Re-evaluated prior to induction Oxygen Delivery Method: Circle system utilized Preoxygenation: Pre-oxygenation with 100% oxygen Induction Type: IV induction Ventilation: Mask ventilation without difficulty Laryngoscope Size: Miller and 2 Grade View: Grade I Tube type: Oral Tube size: 7.5 mm Number of attempts: 1 Airway Equipment and Method: Stylet Placement Confirmation: ETT inserted through vocal cords under direct vision,  positive ETCO2 and breath sounds checked- equal and bilateral Secured at: 21 cm Tube secured with: Tape Dental Injury: Teeth and Oropharynx as per pre-operative assessment

## 2018-07-29 NOTE — ED Provider Notes (Signed)
Patient placed in Quick Look pathway, seen and evaluated   Chief Complaint: abdominal pain, sent from UC   HPI:   Lower abdominal pain, constant, moderate, non radiating pressure/tight pain worse at RLQ onset last night. Associated with nausea. [facility] and told she may have appendicitis.   ROS: negative for fevers, chills, vomiting, dysuria, hematuria, urgency, frequency, abnormal vaginal discharge or bleeding. H/o IBS but BM unchanged. No abdominal surgeries.   Physical Exam:   Gen: No distress  Neuro: Awake and Alert  Skin: Warm    Focused Exam: moderate tenderness @ RLQ and suprapubic regions. No G/R/R. No CVAT.    Initiation of care has begun. The patient has been counseled on the process, plan, and necessity for staying for the completion/evaluation, and the remainder of the medical screening examination    Jerrell MylarGibbons, Isaiyah Feldhaus J, PA-C 07/29/18 1716    Eber HongMiller, Brian, MD 07/30/18 1038

## 2018-07-29 NOTE — Transfer of Care (Signed)
Immediate Anesthesia Transfer of Care Note  Patient: Elaine DillonSarah L [name]  Procedure(s) Performed: APPENDECTOMY LAPAROSCOPIC (N/A Abdomen)  Patient Location: PACU  Anesthesia Type:General  Level of Consciousness: oriented and drowsy  Airway & Oxygen Therapy: Patient Spontanous Breathing  Post-op Assessment: Report given to RN and Post -op Vital signs reviewed and stable  Post vital signs: Reviewed and stable  Last Vitals:  Vitals Value Taken Time  BP    Temp    Pulse    Resp    SpO2      Last Pain:  Vitals:   07/29/18 1930  TempSrc:   PainSc: 3          Complications: No apparent anesthesia complications

## 2018-07-29 NOTE — ED Triage Notes (Signed)
Abdominal pain x 1 day. 

## 2018-07-29 NOTE — Anesthesia Preprocedure Evaluation (Addendum)
Anesthesia Evaluation  Patient identified by MRN, date of birth, ID band Patient awake    Reviewed: Allergy & Precautions, NPO status , Patient's Chart, lab work & pertinent test results  Airway Mallampati: I  TM Distance: >3 FB Neck ROM: Full    Dental  (+) Teeth Intact   Pulmonary former smoker,    breath sounds clear to auscultation       Cardiovascular + Peripheral Vascular Disease   Rhythm:Regular     Neuro/Psych  Headaches, PSYCHIATRIC DISORDERS Anxiety  Neuromuscular disease    GI/Hepatic Neg liver ROS, appendicits   Endo/Other  negative endocrine ROS  Renal/GU negative Renal ROS  negative genitourinary   Musculoskeletal negative musculoskeletal ROS (+)   Abdominal   Peds negative pediatric ROS (+)  Hematology negative hematology ROS (+)   Anesthesia Other Findings Carylon PerchesEhlers Danlos   Reproductive/Obstetrics                           Anesthesia Physical Anesthesia Plan  ASA: II  Anesthesia Plan: General   Post-op Pain Management:    Induction: Intravenous  PONV Risk Score and Plan: 3 and Ondansetron and Dexamethasone  Airway Management Planned: Oral ETT  Additional Equipment: None  Intra-op Plan:   Post-operative Plan: Extubation in OR  Informed Consent: I have reviewed the patients History and Physical, chart, labs and discussed the procedure including the risks, benefits and alternatives for the proposed anesthesia with the patient or authorized representative who has indicated his/her understanding and acceptance.   Dental advisory given  Plan Discussed with: CRNA and Surgeon  Anesthesia Plan Comments: (Unable to remove all piercings  )        Anesthesia Quick Evaluation

## 2018-07-30 ENCOUNTER — Encounter (HOSPITAL_COMMUNITY): Payer: Self-pay | Admitting: General Surgery

## 2018-07-30 MED ORDER — PANTOPRAZOLE SODIUM 40 MG PO TBEC
40.0000 mg | DELAYED_RELEASE_TABLET | Freq: Every day | ORAL | Status: DC
  Administered 2018-07-30: 40 mg via ORAL
  Filled 2018-07-30: qty 1

## 2018-07-30 MED ORDER — OXYCODONE HCL 5 MG PO TABS: 5.0000 mg | ORAL_TABLET | ORAL | 0 refills | Status: DC | PRN

## 2018-07-30 MED ORDER — ACETAMINOPHEN 500 MG PO TABS
1000.0000 mg | ORAL_TABLET | Freq: Four times a day (QID) | ORAL | Status: DC
  Administered 2018-07-30 (×3): 1000 mg via ORAL
  Filled 2018-07-30 (×3): qty 2

## 2018-07-30 MED ORDER — MORPHINE SULFATE (PF) 2 MG/ML IV SOLN
1.0000 mg | INTRAVENOUS | Status: DC | PRN
  Administered 2018-07-30: 1 mg via INTRAVENOUS
  Filled 2018-07-30: qty 1

## 2018-07-30 MED ORDER — METHOCARBAMOL 500 MG PO TABS: 500.0000 mg | ORAL_TABLET | Freq: Four times a day (QID) | ORAL | 0 refills | Status: DC | PRN

## 2018-07-30 MED ORDER — PROCHLORPERAZINE MALEATE 10 MG PO TABS
10.0000 mg | ORAL_TABLET | Freq: Four times a day (QID) | ORAL | Status: DC | PRN
  Filled 2018-07-30: qty 1

## 2018-07-30 MED ORDER — OXYCODONE HCL 5 MG PO TABS
5.0000 mg | ORAL_TABLET | ORAL | Status: DC | PRN
  Administered 2018-07-30 (×3): 5 mg via ORAL
  Filled 2018-07-30 (×3): qty 1

## 2018-07-30 MED ORDER — FLUCONAZOLE 100 MG PO TABS: 100.0000 mg | ORAL_TABLET | Freq: Every day | ORAL | 0 refills | Status: AC

## 2018-07-30 MED ORDER — METHOCARBAMOL 500 MG PO TABS: 500.0000 mg | ORAL_TABLET | Freq: Four times a day (QID) | ORAL | Status: DC | PRN

## 2018-07-30 MED ORDER — PROCHLORPERAZINE EDISYLATE 10 MG/2ML IJ SOLN: 5.0000 mg | Freq: Four times a day (QID) | INTRAMUSCULAR | Status: DC | PRN

## 2018-07-30 MED ORDER — ONDANSETRON HCL 4 MG/2ML IJ SOLN: 4.0000 mg | Freq: Four times a day (QID) | INTRAMUSCULAR | Status: DC | PRN

## 2018-07-30 MED ORDER — SIMETHICONE 80 MG PO CHEW: 40.0000 mg | CHEWABLE_TABLET | Freq: Four times a day (QID) | ORAL | Status: DC | PRN

## 2018-07-30 MED ORDER — ACETAMINOPHEN 500 MG PO TABS: 1000.0000 mg | ORAL_TABLET | Freq: Four times a day (QID) | ORAL | 0 refills | Status: AC | PRN

## 2018-07-30 MED ORDER — ONDANSETRON 4 MG PO TBDP: 4.0000 mg | ORAL_TABLET | Freq: Four times a day (QID) | ORAL | Status: DC | PRN

## 2018-07-30 MED ORDER — KETOROLAC TROMETHAMINE 15 MG/ML IJ SOLN
15.0000 mg | Freq: Four times a day (QID) | INTRAMUSCULAR | Status: DC | PRN
  Administered 2018-07-30: 15 mg via INTRAVENOUS
  Filled 2018-07-30: qty 1

## 2018-07-30 MED ORDER — ENOXAPARIN SODIUM 40 MG/0.4ML ~~LOC~~ SOLN
40.0000 mg | SUBCUTANEOUS | Status: DC
  Administered 2018-07-30: 40 mg via SUBCUTANEOUS
  Filled 2018-07-30: qty 0.4

## 2018-07-30 MED ORDER — DIPHENHYDRAMINE HCL 50 MG/ML IJ SOLN: 12.5000 mg | Freq: Four times a day (QID) | INTRAMUSCULAR | Status: DC | PRN

## 2018-07-30 MED ORDER — DIPHENHYDRAMINE HCL 12.5 MG/5ML PO ELIX: 12.5000 mg | ORAL_SOLUTION | Freq: Four times a day (QID) | ORAL | Status: DC | PRN

## 2018-07-30 NOTE — Discharge Summary (Addendum)
     Patient ID: Elaine [name] [id number] 05-Nov-1981 37 y.o.  Admit date: 07/29/2018 Discharge date: 07/30/2018  Admitting Diagnosis: Acute appendicitis  Discharge Diagnosis Patient Active Problem List   Diagnosis Date Noted  . Appendicitis 07/29/2018    Consultants none  Reason for Admission: [age] yof with less than 24 hours of rlq pain that has been getting worse. Nothing making better. No fever. Some nausea, no emesis, never had before.  No issues with urination. Has prior history of lap chole for gallstones and had pancreatitis then.  She has been on prilosec and pepcid for reflux and possible ulcers but never had evaluated really.  She does not work.  Movement makes pain worse, is dull constant when lying down.   Procedures Lap appy, Dr. Dwain SarnaWakefield 8/20  Hospital Course:  The patient was admitted and underwent a laparoscopic appendectomy.  The patient tolerated the procedure well.  On POD 1, the patient was tolerating a regular diet, voiding well, mobilizing, and pain was controlled with oral pain medications.  The patient was stable for DC home at this time with appropriate follow up made.  Of note the patient states that she occasionally gets yeast in her umbilicus and was recently treating this with nystatin ointment.  Her umbilicus does not appear to have yeast currently.  She will be given a Rx for diflucan in case she notices that this may be becoming an issue as she will not be able to put ointment on her incision.     Physical Exam: Abd: soft, appropriately tender, +BS, ND, incisions c/d/i  Allergies as of 07/30/2018      Reactions   Sulfa Antibiotics Nausea Only   Ciprofloxacin    Morphine And Related Nausea And Vomiting, Other (See Comments)   Chest pain      Medication List    TAKE these medications   acetaminophen 500 MG tablet Commonly known as:  TYLENOL Take 2 tablets (1,000 mg total) by mouth every 6 (six) hours as needed.   fluconazole 100 MG  tablet Commonly known as:  DIFLUCAN Take 1 tablet (100 mg total) by mouth daily for 2 days.   methocarbamol 500 MG tablet Commonly known as:  ROBAXIN Take 1 tablet (500 mg total) by mouth every 6 (six) hours as needed for muscle spasms.   omeprazole 20 MG capsule Commonly known as:  PRILOSEC Take 20 mg by mouth daily.   oxyCODONE 5 MG immediate release tablet Commonly known as:  Oxy IR/ROXICODONE Take 1 tablet (5 mg total) by mouth every 4 (four) hours as needed for moderate pain.        Follow-up Information    [facility]/2018.   Specialty:  General Surgery Why:  10:15am, arrive 30 minutes prior to your appointment for paperwork and check in Contact information: [address] [place] [address] [phone]           Signed: Barnetta ChapelKelly Rie Mcneil, [facility] 07/30/2018, 10:36 AM Pager: [phone]

## 2018-07-30 NOTE — Progress Notes (Signed)
Discharge home. Home discahrge instruction given, no question verbalized. 

## 2018-07-30 NOTE — Care Management Note (Signed)
Case Management Note  Patient Details  Name: Elaine DillonSarah L [name] MRN: [id number] Date of Birth: 1981/09/11  Subjective/Objective:                    Action/Plan:   Expected Discharge Date:  07/30/18               Expected Discharge Plan:  Home/Self Care  In-House Referral:  Financial Counselor  Discharge planning Services  CM Consult, Medication Assistance, MATCH Program, Indigent Health Clinic  Post Acute Care Choice:  NA Choice offered to:     DME Arranged:  N/A DME Agency:  NA  HH Arranged:  NA HH Agency:  NA  Status of Service:  Completed, signed off  If discussed at Long Length of Stay Meetings, dates discussed:    Additional Comments:  Elaine [name], Elaine Breon Marie, RN 07/30/2018, 11:55 AM

## 2018-07-30 NOTE — Plan of Care (Signed)
  Problem: Education: Goal: Knowledge of General Education information will improve Description Including pain rating scale, medication(s)/side effects and non-pharmacologic comfort measures Outcome: Progressing   Problem: Clinical Measurements: Goal: Will remain free from infection Outcome: Progressing   Problem: Coping: Goal: Level of anxiety will decrease Outcome: Progressing   Problem: Elimination: Goal: Will not experience complications related to bowel motility Outcome: Progressing   Problem: Pain Managment: Goal: General experience of comfort will improve Outcome: Progressing   Problem: Safety: Goal: Ability to remain free from injury will improve Outcome: Progressing   Problem: Clinical Measurements: Goal: Postoperative complications will be avoided or minimized Outcome: Progressing   Problem: Skin Integrity: Goal: Demonstration of wound healing without infection will improve Outcome: Progressing

## 2018-07-31 ENCOUNTER — Ambulatory Visit (HOSPITAL_COMMUNITY)
Admission: EM | Admit: 2018-07-31 | Discharge: 2018-07-31 | Disposition: A | Discharge status: Home / Self Care | Payer: Self-pay | Attending: Family Medicine | Admitting: Family Medicine

## 2018-07-31 ENCOUNTER — Encounter (HOSPITAL_COMMUNITY): Payer: Self-pay

## 2018-07-31 ENCOUNTER — Other Ambulatory Visit: Payer: Self-pay

## 2018-07-31 DIAGNOSIS — R300 Dysuria: Secondary | ICD-10-CM

## 2018-07-31 LAB — POCT PREGNANCY, URINE: Preg Test, Ur: NEGATIVE

## 2018-07-31 LAB — POCT URINALYSIS DIP (DEVICE)
Bilirubin Urine: NEGATIVE
Glucose, UA: NEGATIVE mg/dL
HGB URINE DIPSTICK: NEGATIVE
LEUKOCYTES UA: NEGATIVE
Nitrite: NEGATIVE
Protein, ur: NEGATIVE mg/dL
SPECIFIC GRAVITY, URINE: 1.01 (ref 1.005–1.030)
UROBILINOGEN UA: 0.2 mg/dL (ref 0.0–1.0)
pH: 7 (ref 5.0–8.0)

## 2018-07-31 NOTE — ED Triage Notes (Signed)
UTI this started today.

## 2018-07-31 NOTE — Discharge Instructions (Addendum)
Urine did not show signs of infection just dehydration Please drink about half your weight in ounces in ensure adequate hydration PCP assistance initiated [facility] with PCP [facility] if symptoms persists Return or go to the ED if you have any new or worsening symptoms

## 2018-07-31 NOTE — ED Provider Notes (Signed)
[facility]  CC: painful with urination   SUBJECTIVE:  Elaine [name] is a 37 y.o. female who complains of dysuria that began today.  Patient is 2 days post-op appendectomy.  Denies abdominal or flank pain.  Has NOT tried OTC medications.  Symptoms are made worse with urination.  Admits to similar symptoms in the past.  Denies fever, chills, nausea, vomiting, abdominal pain, flank pain, abnormal vaginal discharge or bleeding, hematuria.    LMP: Patient's last menstrual period was 07/28/2018.  ROS: As in HPI.  Past Medical History:  Diagnosis Date  . Anorexia   . Anxiety   . Dysrhythmia   . Gall stones   . Headache   . Hypermobility of joint    Ehler Danlos Syndrome  . IBS (irritable bowel syndrome)   . Infection    bladder  . Jaundice   . Mastoiditis   . Migraine   . Morton neuroma   . Ovarian cyst   . Pancreatitis   . Psoriasis   . Psoriasis   . Scoliosis    Past Surgical History:  Procedure Laterality Date  . DILATION AND CURETTAGE OF UTERUS  2002  . LAPAROSCOPIC APPENDECTOMY N/A 07/29/2018   Procedure: APPENDECTOMY LAPAROSCOPIC;  Surgeon: Emelia LoronWakefield, Matthew, MD;  [facility];  Service: General;  Laterality: N/A;  . LAPAROSCOPIC CHOLECYSTECTOMY  2006   Allergies  Allergen Reactions  . Sulfa Antibiotics Nausea Only  . Ciprofloxacin     Has higher risk of stroke if taken   No current facility-administered medications on file prior to encounter.    Current Outpatient Medications on File Prior to Encounter  Medication Sig Dispense Refill  . acetaminophen (TYLENOL) 500 MG tablet Take 2 tablets (1,000 mg total) by mouth every 6 (six) hours as needed. 30 tablet 0  . B Complex-C (B-COMPLEX WITH VITAMIN C) tablet Take 1 tablet by mouth daily.    . Biotin 1000 MCG tablet Take 1,000 mcg by mouth daily.    . budesonide (RHINOCORT ALLERGY) 32 MCG/ACT nasal spray Place 1 spray into both nostrils every evening.     . cetirizine (ZYRTEC) 10 MG tablet Take 10 mg  by mouth every morning.     . chlorpheniramine (ALLER-CHLOR) 4 MG tablet Take 4 mg by mouth every evening.    . fluconazole (DIFLUCAN) 100 MG tablet Take 1 tablet (100 mg total) by mouth daily for 2 days. 2 tablet 0  . methocarbamol (ROBAXIN) 500 MG tablet Take 1 tablet (500 mg total) by mouth every 6 (six) hours as needed for muscle spasms. 20 tablet 0  . omeprazole (PRILOSEC) 20 MG capsule Take 20 mg by mouth daily.    Marland Kitchen. oxyCODONE (OXY IR/ROXICODONE) 5 MG immediate release tablet Take 1 tablet (5 mg total) by mouth every 4 (four) hours as needed for moderate pain. 20 tablet 0   Social History   Socioeconomic History  . Marital status: Married    Spouse name: Not on file  . Number of children: Not on file  . Years of education: Not on file  . Highest education level: Not on file  Occupational History  . Not on file  Social Needs  . Financial resource strain: Not on file  . Food insecurity:    Worry: Not on file    Inability: Not on file  . Transportation needs:    Medical: Not on file    Non-medical: Not on file  Tobacco Use  . Smoking status: Former Smoker  Packs/day: 1.00    Types: Cigarettes  . Smokeless tobacco: Never Used  Substance and Sexual Activity  . Alcohol use: No  . Drug use: No  . Sexual activity: Not on file    Comment: vasectomy in spouse  Lifestyle  . Physical activity:    Days per week: Not on file    Minutes per session: Not on file  . Stress: Not on file  Relationships  . Social connections:    Talks on phone: Not on file    Gets together: Not on file    Attends religious service: Not on file    Active member of club or organization: Not on file    Attends meetings of clubs or organizations: Not on file    Relationship status: Not on file  . Intimate partner violence:    Fear of current or ex partner: Not on file    Emotionally abused: Not on file    Physically abused: Not on file    Forced sexual activity: Not on file  Other Topics Concern    . Not on file  Social History Narrative  . Not on file   Family History  Problem Relation Age of Onset  . Heart disease Father   . Cancer Father        skin  . Osteoarthritis Father     OBJECTIVE:  Vitals:   07/31/18 1505 07/31/18 1506 07/31/18 1507  BP:   (!) 139/93  Pulse: 85    Resp: 15    Temp: 98.2 F (36.8 C)    TempSrc: Oral    SpO2: 100%    Weight:  120 lb (54.4 kg)    General appearance: AOx3 in no acute distress; nontoxic appearance HEENT: NCAT.  Oropharynx clear.  Lungs: clear to auscultation bilaterally without adventitious breath sounds Heart: regular rate and rhythm.  Radial pulses 2+ symmetrical bilaterally Abdomen: surgical incision healing without obvious drainage, swelling, or inflammation; abdomen soft; non-distended; mild diffuse tenderness; bowel sounds present; no guarding Back: no CVA tenderness Extremities: no edema; symmetrical with no gross deformities Skin: warm and dry Neurologic: Ambulates from chair to exam table without difficulty Psychological: alert and cooperative; normal mood and affect  Labs Reviewed  POCT URINALYSIS DIP (DEVICE) - Abnormal; Notable for the following components:      Result Value   Ketones, ur TRACE (*)    All other components within normal limits  POCT PREGNANCY, URINE    ASSESSMENT & PLAN:  1. Dysuria     No orders of the defined types were placed in this encounter.  Urine did not show signs of infection just dehydration Please drink about half your weight in ounces in ensure adequate hydration PCP assistance initiated Follow up with PCP or with [facility] if symptoms persists Return or go to the ED if you have any new or worsening symptoms  Outlined signs and symptoms indicating need for more acute intervention. Patient verbalized understanding. After Visit Summary given.     Rennis Harding, PA-C 07/31/18 1713

## 2018-08-04 NOTE — Anesthesia Postprocedure Evaluation (Signed)
Anesthesia Post Note  Patient: Ricard DillonSarah L Trawick  Procedure(s) Performed: APPENDECTOMY LAPAROSCOPIC (N/A Abdomen)     Patient location during evaluation: PACU Anesthesia Type: General Level of consciousness: awake and alert Pain management: pain level controlled Vital Signs Assessment: post-procedure vital signs reviewed and stable Respiratory status: spontaneous breathing, nonlabored ventilation, respiratory function stable and patient connected to nasal cannula oxygen Cardiovascular status: blood pressure returned to baseline and stable Postop Assessment: no apparent nausea or vomiting Anesthetic complications: no    Last Vitals:  Vitals:   07/30/18 1003 07/30/18 1006  BP: 111/84 111/84  Pulse: 68 68  Resp: 16 15  Temp: 37.1 C 37.1 C  SpO2: 97% 97%    Last Pain:  Vitals:   07/30/18 1140  TempSrc:   PainSc: 7                  Rohn Fritsch

## 2020-03-03 ENCOUNTER — Ambulatory Visit: Payer: Self-pay | Attending: Internal Medicine

## 2020-03-03 DIAGNOSIS — Z23 Encounter for immunization: Secondary | ICD-10-CM

## 2020-03-03 NOTE — Progress Notes (Signed)
   Covid-19 Vaccination Clinic  Name:  ANDILYN BETTCHER    MRN: [id number] DOB: July 16, 1981  03/03/2020  Ms. Chauca was observed post Covid-19 immunization for 15 minutes without incident. She was provided with Vaccine Information Sheet and instruction to access the V-Safe system.   Ms. Winnick was instructed to call 911 with any severe reactions post vaccine: Marland Kitchen Difficulty breathing  . Swelling of face and throat  . A fast heartbeat  . A bad rash all over body  . Dizziness and weakness   Immunizations Administered    Name Date Dose VIS Date Route   Pfizer COVID-19 Vaccine 03/03/2020  4:48 PM 0.3 mL 11/20/2019 Intramuscular   Manufacturer: [employer], [employer]   Lot: [id number]   NDC: [address]-5

## 2020-03-28 ENCOUNTER — Ambulatory Visit: Payer: Self-pay | Attending: Internal Medicine

## 2020-03-28 DIAGNOSIS — Z23 Encounter for immunization: Secondary | ICD-10-CM

## 2020-03-28 NOTE — Progress Notes (Signed)
   Covid-19 Vaccination Clinic  Name:  Elaine [name]    MRN: [id number] DOB: 1981-01-31  03/28/2020  Elaine [name] was observed post Covid-19 immunization for 15 minutes without incident. She was provided with Vaccine Information Sheet and instruction to access the V-Safe system.   Elaine [name] was instructed to call 911 with any severe reactions post vaccine: Marland Kitchen Difficulty breathing  . Swelling of face and throat  . A fast heartbeat  . A bad rash all over body  . Dizziness and weakness   Immunizations Administered    Name Date Dose VIS Date Route   Pfizer COVID-19 Vaccine 03/28/2020  3:50 PM 0.3 mL 02/03/2019 Intramuscular   Manufacturer: [employer], [employer]   Lot: [id number]   NDC: [address]-8

## 2020-06-20 ENCOUNTER — Other Ambulatory Visit: Payer: Self-pay

## 2020-06-20 ENCOUNTER — Encounter (HOSPITAL_COMMUNITY): Payer: Self-pay

## 2020-06-20 ENCOUNTER — Emergency Department (HOSPITAL_COMMUNITY)
Admission: EM | Admit: 2020-06-20 | Discharge: 2020-06-21 | Disposition: A | Discharge status: Home / Self Care | Payer: Self-pay | Attending: Emergency Medicine | Admitting: Emergency Medicine

## 2020-06-20 ENCOUNTER — Emergency Department (HOSPITAL_COMMUNITY): Payer: Self-pay

## 2020-06-20 DIAGNOSIS — Z79899 Other long term (current) drug therapy: Secondary | ICD-10-CM | POA: Insufficient documentation

## 2020-06-20 DIAGNOSIS — I10 Essential (primary) hypertension: Secondary | ICD-10-CM | POA: Insufficient documentation

## 2020-06-20 DIAGNOSIS — Z87891 Personal history of nicotine dependence: Secondary | ICD-10-CM | POA: Insufficient documentation

## 2020-06-20 DIAGNOSIS — R519 Headache, unspecified: Secondary | ICD-10-CM | POA: Insufficient documentation

## 2020-06-20 LAB — BASIC METABOLIC PANEL WITH GFR
Anion gap: 11 (ref 5–15)
BUN: <5 mg/dL — ABNORMAL LOW (ref 6–20)
CO2: 24 mmol/L (ref 22–32)
Calcium: 8.6 mg/dL — ABNORMAL LOW (ref 8.9–10.3)
Chloride: 104 mmol/L (ref 98–111)
Creatinine, Ser: 0.67 mg/dL (ref 0.44–1.00)
GFR calc Af Amer: >60 mL/min
GFR calc non Af Amer: >60 mL/min
Glucose, Bld: 122 mg/dL — ABNORMAL HIGH (ref 70–99)
Potassium: 3.9 mmol/L (ref 3.5–5.1)
Sodium: 139 mmol/L (ref 135–145)

## 2020-06-20 LAB — URINALYSIS, ROUTINE W REFLEX MICROSCOPIC
Bilirubin Urine: NEGATIVE
Glucose, UA: NEGATIVE mg/dL
Hgb urine dipstick: NEGATIVE
Ketones, ur: NEGATIVE mg/dL
Leukocytes,Ua: NEGATIVE
Nitrite: NEGATIVE
Protein, ur: NEGATIVE mg/dL
Specific Gravity, Urine: 1.025 (ref 1.005–1.030)
pH: 6 (ref 5.0–8.0)

## 2020-06-20 LAB — CBC
HCT: 43.3 % (ref 36.0–46.0)
Hemoglobin: 13.8 g/dL (ref 12.0–15.0)
MCH: 28.9 pg (ref 26.0–34.0)
MCHC: 31.9 g/dL (ref 30.0–36.0)
MCV: 90.6 fL (ref 80.0–100.0)
Platelets: 323 10*3/uL (ref 150–400)
RBC: 4.78 MIL/uL (ref 3.87–5.11)
RDW: 13.5 % (ref 11.5–15.5)
WBC: 8.6 10*3/uL (ref 4.0–10.5)
nRBC: 0 % (ref 0.0–0.2)

## 2020-06-20 LAB — I-STAT BETA HCG BLOOD, ED (MC, WL, AP ONLY): I-stat hCG, quantitative: <5 m[IU]/mL (ref <5)

## 2020-06-20 MED ORDER — METOCLOPRAMIDE HCL 5 MG/ML IJ SOLN
10.0000 mg | Freq: Once | INTRAMUSCULAR | Status: AC
  Administered 2020-06-21: 10 mg via INTRAVENOUS
  Filled 2020-06-20: qty 2

## 2020-06-20 MED ORDER — KETOROLAC TROMETHAMINE 30 MG/ML IJ SOLN
30.0000 mg | Freq: Once | INTRAMUSCULAR | Status: AC
  Administered 2020-06-21: 30 mg via INTRAVENOUS
  Filled 2020-06-20: qty 1

## 2020-06-20 MED ORDER — DEXAMETHASONE SODIUM PHOSPHATE 10 MG/ML IJ SOLN
10.0000 mg | Freq: Once | INTRAMUSCULAR | Status: AC
  Administered 2020-06-21: 10 mg via INTRAVENOUS
  Filled 2020-06-20: qty 1

## 2020-06-20 MED ORDER — DIPHENHYDRAMINE HCL 50 MG/ML IJ SOLN
25.0000 mg | Freq: Once | INTRAMUSCULAR | Status: AC
  Administered 2020-06-21: 25 mg via INTRAVENOUS
  Filled 2020-06-20: qty 1

## 2020-06-20 MED ORDER — SODIUM CHLORIDE 0.9 % IV BOLUS
1000.0000 mL | Freq: Once | INTRAVENOUS | Status: AC
  Administered 2020-06-21: 1000 mL via INTRAVENOUS

## 2020-06-20 NOTE — ED Provider Notes (Signed)
[facility] Provider Note   CSN: [id number] Arrival date & time: 06/20/20  1419     History Chief Complaint  Patient presents with  . Headache    Elaine [name] is a 39 y.o. female.  39 year old female with history of migraines presents with complaint of left-sided headache x3 days.  Patient states that she is never had a migraine last this long.  Pain located left side of head into left eye, worse with lights, also reports aura.  Patient is taking Motrin and Tylenol at home without relief of her pain.  Reports vomiting on the first day of her headache but not since then.  Denies unilateral weakness or numbness, changes in speech, gait, denies shortness of breath or chest pain.  Patient states that her blood pressure was elevated the last time she was in the emergency room with appendicitis, states people of asked her before if she takes medication for blood pressure and she does not.  Patient recently lost her health insurance, inquires about primary care follow-up resources.  Patient will be given resources at discharge.        Past Medical History:  Diagnosis Date  . Anorexia   . Anxiety   . Dysrhythmia   . Gall stones   . Headache   . Hypermobility of joint    Ehler Danlos Syndrome  . IBS (irritable bowel syndrome)   . Infection    bladder  . Jaundice   . Mastoiditis   . Migraine   . Morton neuroma   . Ovarian cyst   . Pancreatitis   . Psoriasis   . Psoriasis   . Scoliosis     Patient Active Problem List   Diagnosis Date Noted  . Appendicitis 07/29/2018    Past Surgical History:  Procedure Laterality Date  . DILATION AND CURETTAGE OF UTERUS  2002  . LAPAROSCOPIC APPENDECTOMY N/A 07/29/2018   Procedure: APPENDECTOMY LAPAROSCOPIC;  Surgeon: Emelia LoronWakefield, Matthew, MD;  [facility];  Service: General;  Laterality: N/A;  . LAPAROSCOPIC CHOLECYSTECTOMY  2006     OB History    Gravida  1   Para      Term      Preterm       AB  1   Living        SAB  1   TAB      Ectopic      Multiple      Live Births              Family History  Problem Relation Age of Onset  . Heart disease Father   . Cancer Father        skin  . Osteoarthritis Father     Social History   Tobacco Use  . Smoking status: Former Smoker    Packs/day: 1.00    Types: Cigarettes  . Smokeless tobacco: Never Used  Substance Use Topics  . Alcohol use: No  . Drug use: No    Home Medications Prior to Admission medications   Medication Sig Start Date End Date Taking? Authorizing Provider  acetaminophen (TYLENOL) 500 MG tablet Take 2 tablets (1,000 mg total) by mouth every 6 (six) hours as needed. 07/30/18   Barnetta Chapelsborne, Kelly, PA-C  B Complex-C (B-COMPLEX WITH VITAMIN C) tablet Take 1 tablet by mouth daily.    [provider]  Biotin 1000 MCG tablet Take 1,000 mcg by mouth daily.    [provider]  budesonide (  RHINOCORT ALLERGY) 32 MCG/ACT nasal spray Place 1 spray into both nostrils every evening.     [provider]  cetirizine (ZYRTEC) 10 MG tablet Take 10 mg by mouth every morning.     [provider]  chlorpheniramine (ALLER-CHLOR) 4 MG tablet Take 4 mg by mouth every evening.    [provider]  methocarbamol (ROBAXIN) 500 MG tablet Take 1 tablet (500 mg total) by mouth every 6 (six) hours as needed for muscle spasms. 07/30/18   Barnetta Chapel, PA-C  metoprolol tartrate (LOPRESSOR) 25 MG tablet Take 1 tablet (25 mg total) by mouth 2 (two) times daily. 06/21/20 07/21/20  Jeannie Fend, PA-C  omeprazole (PRILOSEC) 20 MG capsule Take 20 mg by mouth daily.    [provider]  oxyCODONE (OXY IR/ROXICODONE) 5 MG immediate release tablet Take 1 tablet (5 mg total) by mouth every 4 (four) hours as needed for moderate pain. 07/30/18   Barnetta Chapel, PA-C    Allergies    Sulfa antibiotics and Ciprofloxacin  Review of Systems   Review of Systems  Constitutional: Negative  for fever.  Eyes: Positive for visual disturbance.  Respiratory: Negative for chest tightness and shortness of breath.   Cardiovascular: Negative for chest pain.  Gastrointestinal: Negative for abdominal pain, nausea and vomiting.  Musculoskeletal: Negative for arthralgias, gait problem and myalgias.  Skin: Negative for rash and wound.  Allergic/Immunologic: Negative for immunocompromised state.  Neurological: Positive for headaches. Negative for speech difficulty and weakness.  All other systems reviewed and are negative.   Physical Exam Updated Vital Signs BP (!) 183/98   Pulse 97   Temp 98 F (36.7 C) (Oral)   Resp 16   Ht 5\' 9"  (1.753 m)   Wt 88.5 kg   LMP 06/06/2020   SpO2 100%   BMI 28.80 kg/m   Physical Exam Vitals and nursing note reviewed.  Constitutional:      General: She is not in acute distress.    Appearance: She is well-developed. She is obese. She is not diaphoretic.  HENT:     Head: Normocephalic and atraumatic.     Mouth/Throat:     Mouth: Mucous membranes are moist.     Pharynx: Oropharynx is clear.  Eyes:     Extraocular Movements: Extraocular movements intact.     Pupils: Pupils are equal, round, and reactive to light.  Cardiovascular:     Rate and Rhythm: Normal rate and regular rhythm.     Heart sounds: Normal heart sounds.  Pulmonary:     Effort: Pulmonary effort is normal.     Breath sounds: Normal breath sounds.  Abdominal:     Palpations: Abdomen is soft.     Tenderness: There is no abdominal tenderness.  Musculoskeletal:        General: Normal range of motion.     Cervical back: Neck supple.  Skin:    General: Skin is warm and dry.     Findings: No erythema or rash.  Neurological:     Mental Status: She is alert and oriented to person, place, and time.     GCS: GCS eye subscore is 4. GCS verbal subscore is 5. GCS motor subscore is 6.     Cranial Nerves: No cranial nerve deficit or facial asymmetry.     Sensory: No sensory deficit.       Motor: No weakness.     Coordination: Coordination normal.     Gait: Gait normal.     Deep  Tendon Reflexes: Reflexes normal.  Psychiatric:        Behavior: Behavior normal.     ED Results / Procedures / Treatments   Labs (all labs ordered are listed, but only abnormal results are displayed) Labs Reviewed  BASIC METABOLIC PANEL - Abnormal; Notable for the following components:      Result Value   Glucose, Bld 122 (*)    BUN <5 (*)    Calcium 8.6 (*)    All other components within normal limits  CBC  URINALYSIS, ROUTINE W REFLEX MICROSCOPIC  I-STAT BETA HCG BLOOD, ED ([facility], [facility], [facility] ONLY)    EKG None  Radiology CT HEAD WO CONTRAST  Result Date: 06/20/2020 CLINICAL DATA:  Acute onset headache EXAM: CT HEAD WITHOUT CONTRAST TECHNIQUE: Contiguous axial images were obtained from the base of the skull through the vertex without intravenous contrast. COMPARISON:  05/25/2016 FINDINGS: Brain: No acute infarct or hemorrhage. Lateral ventricles and midline structures are unremarkable. No acute extra-axial fluid collections. No mass effect. Vascular: No hyperdense vessel or unexpected calcification. Skull: Normal. Negative for fracture or focal lesion. Sinuses/Orbits: No acute finding. Other: None. IMPRESSION: 1. Stable exam, no acute process. Electronically Signed   By: Sharlet Salina M.D.   On: 06/20/2020 15:40    Procedures Procedures (including critical care time)  Medications Ordered in ED Medications  sodium chloride 0.9 % bolus 1,000 mL (1,000 mLs Intravenous New Bag/Given 06/21/20 0031)  ketorolac (TORADOL) 30 MG/ML injection 30 mg (30 mg Intravenous Given 06/21/20 0035)  dexamethasone (DECADRON) injection 10 mg (10 mg Intravenous Given 06/21/20 0032)  metoCLOPramide (REGLAN) injection 10 mg (10 mg Intravenous Given 06/21/20 0037)  diphenhydrAMINE (BENADRYL) injection 25 mg (25 mg Intravenous Given 06/21/20 0034)    ED Course  I have reviewed the triage vital signs and the  nursing notes.  Pertinent labs & imaging results that were available during my care of the patient were reviewed by me and considered in my medical decision making (see chart for details).  Clinical Course as of Jun 21 204  Tue Jun 21, 2020  [id number] 39 year old female with complaint of left sided headache onset 3 days ago, reports prior similar migraines but never lasting 3 days, no relief with motrin and tylenol at home. Neuro exam normal, CT  head and labs ordered from triage without significant findings.  Patient was given IV fluids and migraine cocktail with improvement in symptoms. BP has improved although still elevated. Patient states that she feels heart racing at times, has been seen in the ER for this before, states that she monitors it on her apple watch, not having cardiac symptoms at this time however. Patient will be started on metoprolol for her elevated blood pressure, with possible improvement in her migraines and palpitations. Recommend that she monitor her blood pressure at home and follow-up with PCP, given information for [facility] health and wellness. Advised to return to ER for any new or worsening symptoms.   [LM]    Clinical Course User Index [LM] Alden Hipp   MDM Rules/Calculators/A&P                          Final Clinical Impression(s) / ED Diagnoses Final diagnoses:  Nonintractable episodic headache, unspecified headache type  Hypertension, unspecified type    Rx / DC Orders ED Discharge Orders         Ordered    metoprolol tartrate (LOPRESSOR) 25 MG tablet  2 times daily     Discontinue  Reprint     06/21/20 0153           Jeannie Fend, PA-C 06/21/20 0206    Dione Booze, MD 06/21/20 [phone]

## 2020-06-20 NOTE — ED Triage Notes (Signed)
Pt reports severe headache that radiates to her neck since Saturday night, pt also seeing spots in her left eye and states she feels like her eyeball is going to pop out. Pt also nauseated from the pain.

## 2020-06-21 MED ORDER — METOPROLOL TARTRATE 25 MG PO TABS
25.0000 mg | ORAL_TABLET | Freq: Two times a day (BID) | ORAL | 0 refills | Status: DC
Start: 2020-06-21 — End: 2020-07-23

## 2020-06-21 NOTE — ED Notes (Signed)
Patient verbalizes understanding of discharge instructions. Opportunity for questioning and answers were provided. Armband removed by staff, pt discharged from and ambulated to lobby to return home.

## 2020-06-21 NOTE — Discharge Instructions (Addendum)
Monitor your blood pressure at home, record readings and take to PCP for follow-up. Take metoprolol as prescribed.  This medication can help with your blood pressure as well as with your migraines and feelings of heart racing.  Stop taking this medication if your blood pressure is low on monitoring. Follow-up with a primary care provider for medication adjustments and further care.  Return to the emergency room for new or worsening symptoms.

## 2020-07-23 ENCOUNTER — Ambulatory Visit (HOSPITAL_COMMUNITY)
Admission: RE | Admit: 2020-07-23 | Discharge: 2020-07-23 | Disposition: A | Discharge status: Home / Self Care | Payer: Self-pay | Source: Ambulatory Visit | Attending: Family Medicine | Admitting: Family Medicine

## 2020-07-23 ENCOUNTER — Other Ambulatory Visit: Payer: Self-pay

## 2020-07-23 ENCOUNTER — Encounter (HOSPITAL_COMMUNITY): Payer: Self-pay

## 2020-07-23 VITALS — BP 168/135 | HR 96 | Temp 98.2°F | Resp 18

## 2020-07-23 DIAGNOSIS — I10 Essential (primary) hypertension: Secondary | ICD-10-CM

## 2020-07-23 MED ORDER — METOPROLOL TARTRATE 100 MG PO TABS: ORAL_TABLET | ORAL | 0 refills | Status: DC

## 2020-07-23 NOTE — ED Triage Notes (Signed)
Pt presents for medication refill for blood pressure medication.

## 2020-07-25 NOTE — ED Provider Notes (Signed)
[facility] CARE CENTER   [id number] 07/23/20 Arrival Time: 1644  ASSESSMENT & PLAN:  1. Essential hypertension     Refilled: Meds ordered this encounter  Medications  . metoprolol tartrate (LOPRESSOR) 100 MG tablet    Sig: Take 1/2 tablet twice daily.    Dispense:  30 tablet    Refill:  0     Reviewed expectations re: course of current medical issues. Questions answered. Outlined signs and symptoms indicating need for more acute intervention. Patient verbalized understanding. After Visit Summary given.   SUBJECTIVE:  Elaine [name] is a 39 y.o. female who presents with concerns regarding increased blood pressures. She reports that she has been started on medication but will run out soon. Looking for PCP.  She reports taking medications as instructed, no medication side effects noted, no TIA's, no chest pain on exertion, no dyspnea on exertion, no swelling of ankles, no orthostatic dizziness or lightheadedness, no orthopnea or paroxysmal nocturnal dyspnea and no palpitations.  Denies symptoms of chest pain, palpations, orthopnea, nocturnal dyspnea, or LE edema.  Social History   Tobacco Use  Smoking Status Former Smoker  . Packs/day: 1.00  . Types: Cigarettes  Smokeless Tobacco Never Used      OBJECTIVE:  Vitals:   07/23/20 1658  BP: (!) 168/135  Pulse: 96  Resp: 18  Temp: 98.2 F (36.8 C)  TempSrc: Oral  SpO2: 98%    General appearance: alert; no distress Eyes: PERRLA; EOMI HENT: normocephalic; atraumatic Neck: supple Lungs: unlabored Abdomen: soft, non-tender; bowel sounds normal Extremities: no edema; symmetrical with no gross deformities Skin: warm and dry Psychological: alert and cooperative; normal mood and affect  ECG: Orders placed or performed during the hospital encounter of 01/17/18  . EKG 12-Lead  . EKG 12-Lead  . ED EKG within 10 minutes  . ED EKG within 10 minutes  . ED EKG  . ED EKG  . EKG 12-Lead  . EKG 12-Lead  . EKG     Labs: Results for orders placed or performed during the hospital encounter of 06/20/20  Basic metabolic panel  Result Value Ref Range   Sodium 139 135 - 145 mmol/L   Potassium 3.9 3.5 - 5.1 mmol/L   Chloride 104 98 - 111 mmol/L   CO2 24 22 - 32 mmol/L   Glucose, Bld 122 (H) 70 - 99 mg/dL   BUN <5 (L) 6 - 20 mg/dL   Creatinine, Ser 9.73 0.44 - 1.00 mg/dL   Calcium 8.6 (L) 8.9 - 10.3 mg/dL   GFR calc non Af Amer >60 >60 mL/min   GFR calc Af Amer >60 >60 mL/min   Anion gap 11 5 - 15  CBC  Result Value Ref Range   WBC 8.6 4.0 - 10.5 K/uL   RBC 4.78 3.87 - 5.11 MIL/uL   Hemoglobin 13.8 12.0 - 15.0 g/dL   HCT 53.2 36 - 46 %   MCV 90.6 80.0 - 100.0 fL   MCH 28.9 26.0 - 34.0 pg   MCHC 31.9 30.0 - 36.0 g/dL   RDW 99.2 42.6 - 83.4 %   Platelets 323 150 - 400 K/uL   nRBC 0.0 0.0 - 0.2 %  Urinalysis, Routine w reflex microscopic  Result Value Ref Range   Color, Urine YELLOW YELLOW   APPearance CLEAR CLEAR   Specific Gravity, Urine 1.025 1.005 - 1.030   pH 6.0 5.0 - 8.0   Glucose, UA NEGATIVE NEGATIVE mg/dL   Hgb urine dipstick NEGATIVE NEGATIVE  Bilirubin Urine NEGATIVE NEGATIVE   Ketones, ur NEGATIVE NEGATIVE mg/dL   Protein, ur NEGATIVE NEGATIVE mg/dL   Nitrite NEGATIVE NEGATIVE   Leukocytes,Ua NEGATIVE NEGATIVE  I-Stat beta hCG blood, ED  Result Value Ref Range   I-stat hCG, quantitative <5.0 <5 mIU/mL   Comment 3           Labs Reviewed - No data to display  Imaging: No results found.  Allergies  Allergen Reactions  . Sulfa Antibiotics Nausea Only  . Ciprofloxacin     Has higher risk of stroke if taken    Past Medical History:  Diagnosis Date  . Anorexia   . Anxiety   . Dysrhythmia   . Gall stones   . Headache   . Hypermobility of joint    Ehler Danlos Syndrome  . IBS (irritable bowel syndrome)   . Infection    bladder  . Jaundice   . Mastoiditis   . Migraine   . Morton neuroma   . Ovarian cyst   . Pancreatitis   . Psoriasis   . Psoriasis    . Scoliosis    Social History   Socioeconomic History  . Marital status: Married    Spouse name: Not on file  . Number of children: Not on file  . Years of education: Not on file  . Highest education level: Not on file  Occupational History  . Not on file  Tobacco Use  . Smoking status: Former Smoker    Packs/day: 1.00    Types: Cigarettes  . Smokeless tobacco: Never Used  Substance and Sexual Activity  . Alcohol use: No  . Drug use: No  . Sexual activity: Not on file    Comment: vasectomy in spouse  Other Topics Concern  . Not on file  Social History Narrative  . Not on file   Social Determinants of Health   Financial Resource Strain:   . Difficulty of Paying Living Expenses:   Food Insecurity:   . Worried About Programme researcher, broadcasting/film/video in the Last Year:   . Barista in the Last Year:   Transportation Needs:   . Freight forwarder (Medical):   Marland Kitchen Lack of Transportation (Non-Medical):   Physical Activity:   . Days of Exercise per Week:   . Minutes of Exercise per Session:   Stress:   . Feeling of Stress :   Social Connections:   . Frequency of Communication with Friends and Family:   . Frequency of Social Gatherings with Friends and Family:   . Attends Religious Services:   . Active Member of Clubs or Organizations:   . Attends Banker Meetings:   Marland Kitchen Marital Status:   Intimate Partner Violence:   . Fear of Current or Ex-Partner:   . Emotionally Abused:   Marland Kitchen Physically Abused:   . Sexually Abused:    Family History  Problem Relation Age of Onset  . Heart disease Father   . Cancer Father        skin  . Osteoarthritis Father    Past Surgical History:  Procedure Laterality Date  . DILATION AND CURETTAGE OF UTERUS  2002  . LAPAROSCOPIC APPENDECTOMY N/A 07/29/2018   Procedure: APPENDECTOMY LAPAROSCOPIC;  Surgeon: Emelia Loron, MD;  Location: [facility] OR;  Service: General;  Laterality: N/A;  . LAPAROSCOPIC CHOLECYSTECTOMY  2006       Mardella Layman, MD 07/25/20 [id number]

## 2020-08-01 ENCOUNTER — Ambulatory Visit: Payer: Self-pay | Admitting: Family Medicine

## 2020-08-09 ENCOUNTER — Other Ambulatory Visit: Payer: Self-pay

## 2020-08-09 ENCOUNTER — Ambulatory Visit (INDEPENDENT_AMBULATORY_CARE_PROVIDER_SITE_OTHER): Payer: Self-pay | Admitting: Family Medicine

## 2020-08-09 ENCOUNTER — Encounter: Payer: Self-pay | Admitting: Family Medicine

## 2020-08-09 VITALS — BP 163/101 | HR 88 | Temp 98.2°F | Resp 17 | Ht 69.0 in | Wt 286.4 lb

## 2020-08-09 DIAGNOSIS — R002 Palpitations: Secondary | ICD-10-CM

## 2020-08-09 DIAGNOSIS — Z8349 Family history of other endocrine, nutritional and metabolic diseases: Secondary | ICD-10-CM

## 2020-08-09 DIAGNOSIS — T7840XA Allergy, unspecified, initial encounter: Secondary | ICD-10-CM

## 2020-08-09 DIAGNOSIS — F419 Anxiety disorder, unspecified: Secondary | ICD-10-CM

## 2020-08-09 DIAGNOSIS — Z09 Encounter for follow-up examination after completed treatment for conditions other than malignant neoplasm: Secondary | ICD-10-CM

## 2020-08-09 DIAGNOSIS — N83209 Unspecified ovarian cyst, unspecified side: Secondary | ICD-10-CM | POA: Insufficient documentation

## 2020-08-09 DIAGNOSIS — I10 Essential (primary) hypertension: Secondary | ICD-10-CM

## 2020-08-09 DIAGNOSIS — Z0000 Encounter for general adult medical examination without abnormal findings: Secondary | ICD-10-CM

## 2020-08-09 DIAGNOSIS — K219 Gastro-esophageal reflux disease without esophagitis: Secondary | ICD-10-CM

## 2020-08-09 LAB — POCT URINALYSIS DIPSTICK
Bilirubin, UA: NEGATIVE
Blood, UA: NEGATIVE
Glucose, UA: NEGATIVE
Ketones, UA: NEGATIVE
Leukocytes, UA: NEGATIVE
Nitrite, UA: NEGATIVE
Protein, UA: NEGATIVE
Spec Grav, UA: 1.015 (ref 1.010–1.025)
Urobilinogen, UA: 0.2 E.U./dL
pH, UA: 5.5 (ref 5.0–8.0)

## 2020-08-09 LAB — POCT GLYCOSYLATED HEMOGLOBIN (HGB A1C)
HbA1c POC (<> result, manual entry): 5.1 % (ref 4.0–5.6)
HbA1c, POC (controlled diabetic range): 5.1 % (ref 0.0–7.0)
HbA1c, POC (prediabetic range): 5.1 % — AB (ref 5.7–6.4)
Hemoglobin A1C: 5.1 % (ref 4.0–5.6)

## 2020-08-09 LAB — GLUCOSE, POCT (MANUAL RESULT ENTRY): POC Glucose: 115 mg/dl — AB (ref 70–99)

## 2020-08-09 NOTE — Progress Notes (Addendum)
[facility]    New Patient--Hospital Follow Up--Establish Care  Subjective:  Patient ID: Elaine [name], female    DOB: 12-15-1980  Age: 39 y.o. MRN: [id number]  CC:  Chief Complaint  Patient presents with  . Establish Care    Pt states she is concerned about her BP medication and to see how her thyroid level is.    HPI Elaine [name] is a 39 year old female who presents for Follow Up today.    Patient Active Problem List   Diagnosis Date Noted  . Ovarian cyst   . Appendicitis 07/29/2018    Past Medical History:  Diagnosis Date  . Anorexia   . Anxiety   . Dysrhythmia   . Gall stones   . Headache   . Hypermobility of joint    Ehler Danlos Syndrome  . IBS (irritable bowel syndrome)   . Infection    bladder  . Jaundice   . Mastoiditis   . Migraine   . Morton neuroma   . Ovarian cyst   . Pancreatitis   . PCOS (polycystic ovarian syndrome)   . Psoriasis   . Psoriasis   . Scoliosis     Past Surgical History:  Procedure Laterality Date  . DILATION AND CURETTAGE OF UTERUS  2002  . LAPAROSCOPIC APPENDECTOMY N/A 07/29/2018   Procedure: APPENDECTOMY LAPAROSCOPIC;  Surgeon: Emelia Loron, MD;  Location: [facility] OR;  Service: General;  Laterality: N/A;  . LAPAROSCOPIC CHOLECYSTECTOMY  2006   Current Status: This will be Elaine [name]'s initial office visit with me. She was previously seeing physicians in the ED for her PCP needs. Since her last office visit, she has had a few ED/Urgent Care visits for HTN and Headaches in the months of 06/2020 and 07/2020. Today, she  is doing well with no complaints. She denies visual changes, chest pain, cough, shortness of breath, heart palpitations, and falls. She has occasional headaches and dizziness with position changes. Denies severe headaches, confusion, seizures, double vision, and blurred vision, nausea and vomiting. Her anxiety is mild today. She denies suicidal ideations, homicidal  ideations, or auditory hallucinations. She denies fevers, chills, recent infections, weight loss, and night sweats. No reports of GI problems such as diarrhea, and constipation. She has no reports of blood in stools, dysuria and hematuria. She is taking all medications as prescribed. She denies pain today.   Family History  Problem Relation Age of Onset  . Thyroid disease Mother   . Heart disease Father   . Cancer Father        skin  . Osteoarthritis Father   . Colon cancer Maternal Grandmother     Social History   Socioeconomic History  . Marital status: Married    Spouse name: Not on file  . Number of children: Not on file  . Years of education: Not on file  . Highest education level: Not on file  Occupational History  . Not on file  Tobacco Use  . Smoking status: Former Smoker    Packs/day: 1.00    Types: Cigarettes  . Smokeless tobacco: Never Used  Substance and Sexual Activity  . Alcohol use: No  . Drug use: No  . Sexual activity: Yes    Birth control/protection: Surgical    Comment: vasectomy in spouse  Other Topics Concern  . Not on file  Social History Narrative  . Not on file   Social Determinants of Health   Financial Resource  Strain:   . Difficulty of Paying Living Expenses: Not on file  Food Insecurity:   . Worried About Programme researcher, broadcasting/film/videounning Out of Food in the Last Year: Not on file  . Ran Out of Food in the Last Year: Not on file  Transportation Needs:   . Lack of Transportation (Medical): Not on file  . Lack of Transportation (Non-Medical): Not on file  Physical Activity:   . Days of Exercise per Week: Not on file  . Minutes of Exercise per Session: Not on file  Stress:   . Feeling of Stress : Not on file  Social Connections:   . Frequency of Communication with Friends and Family: Not on file  . Frequency of Social Gatherings with Friends and Family: Not on file  . Attends Religious Services: Not on file  . Active Member of Clubs or Organizations: Not on file   . Attends BankerClub or Organization Meetings: Not on file  . Marital Status: Not on file  Intimate Partner Violence:   . Fear of Current or Ex-Partner: Not on file  . Emotionally Abused: Not on file  . Physically Abused: Not on file  . Sexually Abused: Not on file    Outpatient Medications Prior to Visit  Medication Sig Dispense Refill  . acetaminophen (TYLENOL) 500 MG tablet Take 2 tablets (1,000 mg total) by mouth every 6 (six) hours as needed. 30 tablet 0  . B Complex-C (B-COMPLEX WITH VITAMIN C) tablet Take 1 tablet by mouth daily.    . Biotin 1000 MCG tablet Take 1,000 mcg by mouth daily.    . budesonide (RHINOCORT ALLERGY) 32 MCG/ACT nasal spray Place 1 spray into both nostrils every evening.     . cetirizine (ZYRTEC) 10 MG tablet Take 10 mg by mouth every morning.     . chlorpheniramine (ALLER-CHLOR) 4 MG tablet Take 4 mg by mouth every evening.    . metoprolol tartrate (LOPRESSOR) 100 MG tablet Take 1/2 tablet twice daily. 30 tablet 0  . omeprazole (PRILOSEC) 20 MG capsule Take 20 mg by mouth daily. (Patient not taking: Reported on 08/09/2020)    . methocarbamol (ROBAXIN) 500 MG tablet Take 1 tablet (500 mg total) by mouth every 6 (six) hours as needed for muscle spasms. 20 tablet 0  . oxyCODONE (OXY IR/ROXICODONE) 5 MG immediate release tablet Take 1 tablet (5 mg total) by mouth every 4 (four) hours as needed for moderate pain. 20 tablet 0   No facility-administered medications prior to visit.    Allergies  Allergen Reactions  . Sulfa Antibiotics Nausea Only  . Ciprofloxacin     Has higher risk of stroke if taken    ROS Review of Systems  Constitutional: Negative.   HENT: Negative.   Eyes: Negative.   Respiratory: Negative.   Cardiovascular: Positive for palpitations (occasional ).  Gastrointestinal: Negative.   Endocrine: Negative.   Genitourinary: Negative.   Musculoskeletal: Negative.   Skin: Negative.   Allergic/Immunologic: Negative.   Neurological: Positive for  dizziness (occasional ) and headaches (occasional ).  Hematological: Negative.   Psychiatric/Behavioral: Negative.    Objective:    Physical Exam Vitals and nursing note reviewed.  Constitutional:      Appearance: Normal appearance.  HENT:     Head: Normocephalic and atraumatic.     Nose: Nose normal.     Mouth/Throat:     Mouth: Mucous membranes are moist.     Pharynx: Oropharynx is clear.  Cardiovascular:     Rate and Rhythm: Normal  rate and regular rhythm.     Pulses: Normal pulses.     Heart sounds: Normal heart sounds.  Pulmonary:     Effort: Pulmonary effort is normal.     Breath sounds: Normal breath sounds.  Abdominal:     General: Bowel sounds are normal. There is distension (obese).     Palpations: Abdomen is soft.  Musculoskeletal:        General: Normal range of motion.     Cervical back: Normal range of motion and neck supple.  Skin:    General: Skin is warm and dry.  Neurological:     General: No focal deficit present.     Mental Status: She is alert and oriented to person, place, and time.  Psychiatric:        Mood and Affect: Mood normal.        Behavior: Behavior normal.        Thought Content: Thought content normal.        Judgment: Judgment normal.    BP (!) 163/101 (BP Location: Right Arm, Patient Position: Sitting, Cuff Size: Large)   Pulse 88   Temp 98.2 F (36.8 C)   Resp 17   Ht 5\' 9"  (1.753 m)   Wt 286 lb 6.4 oz (129.9 kg)   LMP 07/27/2020 (Approximate)   SpO2 100%   BMI 42.29 kg/m  Wt Readings from Last 3 Encounters:  08/09/20 286 lb 6.4 oz (129.9 kg)  06/20/20 195 lb (88.5 kg)  07/31/18 120 lb (54.4 kg)     Health Maintenance Due  Topic Date Due  . TETANUS/TDAP  Never done  . PAP SMEAR-Modifier  08/30/2018  . INFLUENZA VACCINE  07/10/2020    There are no preventive care reminders to display for this patient.  Lab Results  Component Value Date   TSH 3.838 01/17/2018   Lab Results  Component Value Date   WBC 8.6  06/20/2020   HGB 13.8 06/20/2020   HCT 43.3 06/20/2020   MCV 90.6 06/20/2020   PLT 323 06/20/2020   Lab Results  Component Value Date   NA 139 06/20/2020   K 3.9 06/20/2020   CO2 24 06/20/2020   GLUCOSE 122 (H) 06/20/2020   BUN <5 (L) 06/20/2020   CREATININE 0.67 06/20/2020   BILITOT 0.8 07/29/2018   ALKPHOS 55 07/29/2018   AST 16 07/29/2018   ALT 13 07/29/2018   PROT 7.0 07/29/2018   ALBUMIN 3.8 07/29/2018   CALCIUM 8.6 (L) 06/20/2020   ANIONGAP 11 06/20/2020   Lab Results  Component Value Date   CHOL  10/14/2007    163        ATP III CLASSIFICATION:  <200     mg/dL   Desirable  13/03/2007  mg/dL   Borderline High  478-295    mg/dL   High   Lab Results  Component Value Date   HDL 28 (L) 10/14/2007   Lab Results  Component Value Date   LDLCALC (H) 10/14/2007    114        Total Cholesterol/HDL:CHD Risk Coronary Heart Disease Risk Table                     Men   Women  1/2 Average Risk   3.4   3.3   Lab Results  Component Value Date   TRIG 104 10/14/2007   Lab Results  Component Value Date   CHOLHDL 5.8 10/14/2007   Lab Results  Component Value Date  HGBA1C 5.1 08/09/2020   HGBA1C 5.1 08/09/2020   HGBA1C 5.1 (A) 08/09/2020   HGBA1C 5.1 08/09/2020      Assessment & Plan:   1. Hospital discharge follow-up  2. Encounter for medical examination to establish care - Urinalysis Dipstick - POC Glucose (CBG) - POC HgB A1c  3. Heart palpitations Stable today.   4. Essential hypertension Blood pressures are elevated today. She will begin new hypertensive medication and take all other medications as prescribed, she will decrease high sodium intake, excessive alcohol intake, increase potassium intake, smoking cessation, and increase physical activity of at least 30 minutes of cardio activity daily. She will continue to follow Heart Healthy or DASH diet. - CBC with Differential - Comprehensive metabolic panel - Lipid Panel - TSH - Vitamin B12 - Vitamin D,  25-hydroxy  5. Family history of thyroid disease in mother - T3, Free - T4, Free  6. Anxiety Stable today.  7. Gastroesophageal reflux disease without esophagitis  8. Allergy, initial encounter  9. Healthcare maintenance - CBC with Differential - Comprehensive metabolic panel - Lipid Panel - TSH - Vitamin B12 - Vitamin D, 25-hydroxy  10. Follow up She will is follow up in 1 month.   No orders of the defined types were placed in this encounter.   Orders Placed This Encounter  Procedures  . CBC with Differential  . Comprehensive metabolic panel  . Lipid Panel  . TSH  . Vitamin B12  . Vitamin D, 25-hydroxy  . T3, Free  . T4, Free  . Urinalysis Dipstick  . POC Glucose (CBG)  . POC HgB A1c    Referral Orders  No referral(s) requested today    Raliegh Ip,  MSN, FNP-BC [facility] [address] [place], [place] [address] [phone] fax   Problem List Items Addressed This Visit    None    Visit Diagnoses    Hospital discharge follow-up    -  Primary   Encounter for medical examination to establish care       Relevant Orders   Urinalysis Dipstick (Completed)   POC Glucose (CBG) (Completed)   POC HgB A1c (Completed)   Heart palpitations       Essential hypertension       Relevant Orders   CBC with Differential   Comprehensive metabolic panel   Lipid Panel   TSH   Vitamin B12   Vitamin D, 25-hydroxy   Family history of thyroid disease in mother       Relevant Orders   T3, Free   T4, Free   Anxiety       Gastroesophageal reflux disease without esophagitis       Allergy, initial encounter       Healthcare maintenance       Relevant Orders   CBC with Differential   Comprehensive metabolic panel   Lipid Panel   TSH   Vitamin B12   Vitamin D, 25-hydroxy   Follow up          No orders of the defined types were placed in this  encounter.   Follow-up: No follow-ups on file.    Kallie Locks, FNP

## 2020-08-10 DIAGNOSIS — E559 Vitamin D deficiency, unspecified: Secondary | ICD-10-CM

## 2020-08-10 HISTORY — DX: Vitamin D deficiency, unspecified: E55.9

## 2020-08-10 LAB — CBC WITH DIFFERENTIAL/PLATELET
Basophils Absolute: 0.1 10*3/uL (ref 0.0–0.2)
Basos: 1 %
EOS (ABSOLUTE): 0.4 10*3/uL (ref 0.0–0.4)
Eos: 6 %
Hematocrit: 44.1 % (ref 34.0–46.6)
Hemoglobin: 14.4 g/dL (ref 11.1–15.9)
Immature Grans (Abs): 0 10*3/uL (ref 0.0–0.1)
Immature Granulocytes: 0 %
Lymphocytes Absolute: 1.6 10*3/uL (ref 0.7–3.1)
Lymphs: 24 %
MCH: 28.9 pg (ref 26.6–33.0)
MCHC: 32.7 g/dL (ref 31.5–35.7)
MCV: 89 fL (ref 79–97)
Monocytes Absolute: 0.4 10*3/uL (ref 0.1–0.9)
Monocytes: 6 %
Neutrophils Absolute: 4.1 10*3/uL (ref 1.4–7.0)
Neutrophils: 63 %
Platelets: 313 10*3/uL (ref 150–450)
RBC: 4.98 x10E6/uL (ref 3.77–5.28)
RDW: 12.8 % (ref 11.7–15.4)
WBC: 6.6 10*3/uL (ref 3.4–10.8)

## 2020-08-10 LAB — COMPREHENSIVE METABOLIC PANEL
ALT: 11 IU/L (ref 0–32)
AST: 10 IU/L (ref 0–40)
Albumin/Globulin Ratio: 1.4 (ref 1.2–2.2)
Albumin: 4.2 g/dL (ref 3.8–4.8)
Alkaline Phosphatase: 74 IU/L (ref 48–121)
BUN/Creatinine Ratio: 12 (ref 9–23)
BUN: 8 mg/dL (ref 6–24)
Bilirubin Total: 0.4 mg/dL (ref 0.0–1.2)
CO2: 24 mmol/L (ref 20–29)
Calcium: 9.4 mg/dL (ref 8.7–10.2)
Chloride: 101 mmol/L (ref 96–106)
Creatinine, Ser: 0.66 mg/dL (ref 0.57–1.00)
GFR calc Af Amer: 128 mL/min/{1.73_m2} (ref >59)
GFR calc non Af Amer: 111 mL/min/{1.73_m2} (ref >59)
Globulin, Total: 2.9 g/dL (ref 1.5–4.5)
Glucose: 107 mg/dL — ABNORMAL HIGH (ref 65–99)
Potassium: 4 mmol/L (ref 3.5–5.2)
Sodium: 139 mmol/L (ref 134–144)
Total Protein: 7.1 g/dL (ref 6.0–8.5)

## 2020-08-10 LAB — LIPID PANEL
Chol/HDL Ratio: 3.6 ratio (ref 0.0–4.4)
Cholesterol, Total: 197 mg/dL (ref 100–199)
HDL: 55 mg/dL (ref >39)
LDL Chol Calc (NIH): 122 mg/dL — ABNORMAL HIGH (ref 0–99)
Triglycerides: 113 mg/dL (ref 0–149)
VLDL Cholesterol Cal: 20 mg/dL (ref 5–40)

## 2020-08-10 LAB — T4, FREE: Free T4: 1.01 ng/dL (ref 0.82–1.77)

## 2020-08-10 LAB — VITAMIN D 25 HYDROXY (VIT D DEFICIENCY, FRACTURES): Vit D, 25-Hydroxy: 14.9 ng/mL — ABNORMAL LOW (ref 30.0–100.0)

## 2020-08-10 LAB — VITAMIN B12: Vitamin B-12: 1087 pg/mL (ref 232–1245)

## 2020-08-10 LAB — TSH: TSH: 2.94 u[IU]/mL (ref 0.450–4.500)

## 2020-08-10 LAB — T3, FREE: T3, Free: 2.8 pg/mL (ref 2.0–4.4)

## 2020-08-11 ENCOUNTER — Other Ambulatory Visit: Payer: Self-pay | Admitting: Family Medicine

## 2020-08-11 ENCOUNTER — Telehealth: Payer: Self-pay

## 2020-08-11 ENCOUNTER — Encounter: Payer: Self-pay | Admitting: Family Medicine

## 2020-08-11 DIAGNOSIS — E559 Vitamin D deficiency, unspecified: Secondary | ICD-10-CM

## 2020-08-11 MED ORDER — VITAMIN D (ERGOCALCIFEROL) 1.25 MG (50000 UNIT) PO CAPS: 50000.0000 [IU] | ORAL_CAPSULE | ORAL | 3 refills | Status: DC

## 2020-08-11 NOTE — Progress Notes (Signed)
Pt was called @ 11;00am to discuss her labs. Pt did not answer the phone so a voice mail was left for her to give [place] a call back.

## 2020-08-11 NOTE — Telephone Encounter (Signed)
-----   Message from Kallie Locks, FNP sent at 08/09/2020  8:14 PM EDT ----- All labs are stable. Keep follow up appointment. Please inform patient.

## 2020-08-12 ENCOUNTER — Encounter: Payer: Self-pay | Admitting: Family Medicine

## 2020-08-12 MED ORDER — AMLODIPINE BESYLATE 10 MG PO TABS: 10.0000 mg | ORAL_TABLET | Freq: Every day | ORAL | 3 refills | Status: DC

## 2020-08-12 MED ORDER — METOPROLOL TARTRATE 50 MG PO TABS: 50.0000 mg | ORAL_TABLET | Freq: Two times a day (BID) | ORAL | 3 refills | Status: DC

## 2020-08-16 ENCOUNTER — Telehealth: Payer: Self-pay | Admitting: Family Medicine

## 2020-08-16 NOTE — Telephone Encounter (Signed)
Pt was called to schedule nurse visit. No answer - lvm

## 2020-08-18 ENCOUNTER — Ambulatory Visit (INDEPENDENT_AMBULATORY_CARE_PROVIDER_SITE_OTHER): Payer: Self-pay | Admitting: Nurse Practitioner

## 2020-08-18 ENCOUNTER — Other Ambulatory Visit: Payer: Self-pay

## 2020-08-18 VITALS — BP 154/108 | HR 82 | Temp 97.7°F

## 2020-08-18 DIAGNOSIS — I10 Essential (primary) hypertension: Secondary | ICD-10-CM

## 2020-08-18 MED ORDER — CLONIDINE HCL 0.1 MG PO TABS: 0.2000 mg | ORAL_TABLET | Freq: Once | ORAL | Status: DC

## 2020-08-18 MED ORDER — OLMESARTAN MEDOXOMIL 20 MG PO TABS: 20.0000 mg | ORAL_TABLET | Freq: Every day | ORAL | 3 refills | Status: DC

## 2020-08-18 NOTE — Progress Notes (Signed)
[facility] Patient [facility] [address] [place], [place]  [address] Phone:  [phone]   Fax:  [phone]   Acute Office Visit  Subjective:    Patient ID: Elaine [name], female    DOB: 07-23-1981, 39 y.o.   MRN: [id number]  Chief Complaint  Patient presents with  . Hypertension    HPI Patient is in today for establish. She  has a past medical history of Anorexia, Anxiety, Dysrhythmia, Gall stones, Headache, Hypermobility of joint, IBS (irritable bowel syndrome), Infection, Jaundice, Mastoiditis, Migraine, Morton neuroma, Ovarian cyst, Pancreatitis, PCOS (polycystic ovarian syndrome), Psoriasis, Psoriasis, Scoliosis, and Vitamin D deficiency (08/2020).   Hypertension Patient is here for follow-up of elevated blood pressure. She was recently started on Amlodipine 10mg . Her PCP is out of the office. She came in fro a nurse visit and had to be added on due to elevated BP.   She is not exercising and is adherent to a low-salt diet. Blood pressure is not well controlled at home. Cardiac symptoms: fatigue and headaches. Patient denies chest pain, dyspnea, irregular heart beat, lower extremity edema, palpitations and syncope. Cardiovascular risk factors: obesity (BMI >= 30 kg/m2) and sedentary lifestyle. Use of agents associated with hypertension: none. History of target organ damage: none. Hx of aneoxria.   She amdits that she has a history of migraines. She has frontal headache. She is exhausted and in pain. She has problems working. She is has been coping daily.   Past Medical History:  Diagnosis Date  . Anorexia   . Anxiety   . Dysrhythmia   . Gall stones   . Headache   . Hypermobility of joint    Ehler Danlos Syndrome  . IBS (irritable bowel syndrome)   . Infection    bladder  . Jaundice   . Mastoiditis   . Migraine   . Morton neuroma   . Ovarian cyst   . Pancreatitis   . PCOS (polycystic ovarian syndrome)   . Psoriasis   . Psoriasis   . Scoliosis   . Vitamin D  deficiency 08/2020    Past Surgical History:  Procedure Laterality Date  . DILATION AND CURETTAGE OF UTERUS  2002  . LAPAROSCOPIC APPENDECTOMY N/A 07/29/2018   Procedure: APPENDECTOMY LAPAROSCOPIC;  Surgeon: 07/31/2018, MD;  Location: [facility] OR;  Service: General;  Laterality: N/A;  . LAPAROSCOPIC CHOLECYSTECTOMY  2006    Family History  Problem Relation Age of Onset  . Thyroid disease Mother   . Heart disease Father   . Cancer Father        skin  . Osteoarthritis Father   . Colon cancer Maternal Grandmother     Social History   Socioeconomic History  . Marital status: Married    Spouse name: Not on file  . Number of children: Not on file  . Years of education: Not on file  . Highest education level: Not on file  Occupational History  . Not on file  Tobacco Use  . Smoking status: Former Smoker    Packs/day: 1.00    Types: Cigarettes  . Smokeless tobacco: Never Used  Substance and Sexual Activity  . Alcohol use: No  . Drug use: No  . Sexual activity: Yes    Birth control/protection: Surgical    Comment: vasectomy in spouse  Other Topics Concern  . Not on file  Social History Narrative  . Not on file   Social Determinants of Health   Financial Resource Strain:   .  Difficulty of Paying Living Expenses: Not on file  Food Insecurity:   . Worried About Programme researcher, broadcasting/film/video in the Last Year: Not on file  . Ran Out of Food in the Last Year: Not on file  Transportation Needs:   . Lack of Transportation (Medical): Not on file  . Lack of Transportation (Non-Medical): Not on file  Physical Activity:   . Days of Exercise per Week: Not on file  . Minutes of Exercise per Session: Not on file  Stress:   . Feeling of Stress : Not on file  Social Connections:   . Frequency of Communication with Friends and Family: Not on file  . Frequency of Social Gatherings with Friends and Family: Not on file  . Attends Religious Services: Not on file  . Active Member of Clubs or  Organizations: Not on file  . Attends Banker Meetings: Not on file  . Marital Status: Not on file  Intimate Partner Violence:   . Fear of Current or Ex-Partner: Not on file  . Emotionally Abused: Not on file  . Physically Abused: Not on file  . Sexually Abused: Not on file    Outpatient Medications Prior to Visit  Medication Sig Dispense Refill  . acetaminophen (TYLENOL) 500 MG tablet Take 2 tablets (1,000 mg total) by mouth every 6 (six) hours as needed. 30 tablet 0  . amLODipine (NORVASC) 10 MG tablet Take 1 tablet (10 mg total) by mouth daily. 90 tablet 3  . B Complex-C (B-COMPLEX WITH VITAMIN C) tablet Take 1 tablet by mouth daily.    . Biotin 1000 MCG tablet Take 1,000 mcg by mouth daily.    . budesonide (RHINOCORT ALLERGY) 32 MCG/ACT nasal spray Place 1 spray into both nostrils every evening.     . cetirizine (ZYRTEC) 10 MG tablet Take 10 mg by mouth every morning.     . chlorpheniramine (ALLER-CHLOR) 4 MG tablet Take 4 mg by mouth every evening.    . metoprolol tartrate (LOPRESSOR) 50 MG tablet Take 1 tablet (50 mg total) by mouth 2 (two) times daily. 180 tablet 3  . omeprazole (PRILOSEC) 20 MG capsule Take 20 mg by mouth daily. (Patient not taking: Reported on 08/09/2020)    . Vitamin D, Ergocalciferol, (DRISDOL) 1.25 MG (50000 UNIT) CAPS capsule Take 1 capsule (50,000 Units total) by mouth every 7 (seven) days. 5 capsule 3   No facility-administered medications prior to visit.    Allergies  Allergen Reactions  . Sulfa Antibiotics Nausea Only  . Ciprofloxacin     Has higher risk of stroke if taken    Review of Systems     Objective:    Physical Exam Constitutional:      General: She is not in acute distress.    Appearance: She is obese. She is not ill-appearing, toxic-appearing or diaphoretic.  Cardiovascular:     Rate and Rhythm: Normal rate and regular rhythm.     Pulses: Normal pulses.     Heart sounds: Normal heart sounds.  Pulmonary:      Effort: Pulmonary effort is normal.     Breath sounds: Normal breath sounds.  Musculoskeletal:     Cervical back: Normal range of motion.     Right lower leg: No edema.     Left lower leg: No edema.  Skin:    General: Skin is warm and dry.     Capillary Refill: Capillary refill takes less than 2 seconds.     Coloration: Skin  is pale.  Neurological:     General: No focal deficit present.     Mental Status: She is alert and oriented to person, place, and time.     BP (!) 154/108 (BP Location: Left Arm, Patient Position: Sitting, Cuff Size: Large)   Pulse 82   Temp 97.7 F (36.5 C) (Temporal)   LMP 07/27/2020 (Approximate)   SpO2 100%  Wt Readings from Last 3 Encounters:  08/09/20 286 lb 6.4 oz (129.9 kg)  06/20/20 195 lb (88.5 kg)  07/31/18 120 lb (54.4 kg)    Health Maintenance Due  Topic Date Due  . TETANUS/TDAP  Never done  . PAP SMEAR-Modifier  08/30/2018  . INFLUENZA VACCINE  Never done    There are no preventive care reminders to display for this patient.   Lab Results  Component Value Date   TSH 2.940 08/09/2020   Lab Results  Component Value Date   WBC 6.6 08/09/2020   HGB 14.4 08/09/2020   HCT 44.1 08/09/2020   MCV 89 08/09/2020   PLT 313 08/09/2020   Lab Results  Component Value Date   NA 139 08/09/2020   K 4.0 08/09/2020   CO2 24 08/09/2020   GLUCOSE 107 (H) 08/09/2020   BUN 8 08/09/2020   CREATININE 0.66 08/09/2020   BILITOT 0.4 08/09/2020   ALKPHOS 74 08/09/2020   AST 10 08/09/2020   ALT 11 08/09/2020   PROT 7.1 08/09/2020   ALBUMIN 4.2 08/09/2020   CALCIUM 9.4 08/09/2020   ANIONGAP 11 06/20/2020   Lab Results  Component Value Date   CHOL 197 08/09/2020   Lab Results  Component Value Date   HDL 55 08/09/2020   Lab Results  Component Value Date   LDLCALC 122 (H) 08/09/2020   Lab Results  Component Value Date   TRIG 113 08/09/2020   Lab Results  Component Value Date   CHOLHDL 3.6 08/09/2020   Lab Results  Component Value  Date   HGBA1C 5.1 08/09/2020   HGBA1C 5.1 08/09/2020   HGBA1C 5.1 (A) 08/09/2020   HGBA1C 5.1 08/09/2020       Assessment & Plan:   Problem List Items Addressed This Visit    None    Visit Diagnoses    Essential hypertension    -  Primary After Clonidine patient showed minimal response. Due to her symptoms of HA and the norvasc not being effective. She needs further treatment changes. Will trial Olemastran if affordable. Pt to follow up if unable to afford or not effective etc This can be potential for combination therapy in the future (1 tab daily to increase compliance Encouraged on going compliance with current medication regimen Encouraged home monitoring and recording BP <130/80 Eating a heart-healthy diet with less salt Encouraged regular physical activity  Recommend Weight loss      Relevant Medications   cloNIDine (CATAPRES) tablet 0.2 mg   olmesartan (BENICAR) 20 MG tablet       Meds ordered this encounter  Medications  . cloNIDine (CATAPRES) tablet 0.2 mg  . olmesartan (BENICAR) 20 MG tablet    Sig: Take 1 tablet (20 mg total) by mouth daily.    Dispense:  90 tablet    Refill:  3    Order Specific Question:   Supervising Provider    Answer:   Quentin Angst [[id number]]     Barbette Merino, NP

## 2020-08-18 NOTE — Patient Instructions (Signed)
   Managing Your Hypertension Hypertension is commonly called high blood pressure. This is when the force of your blood pressing against the walls of your arteries is too strong. Arteries are blood vessels that carry blood from your heart throughout your body. Hypertension forces the heart to work harder to pump blood, and may cause the arteries to become narrow or stiff. Having untreated or uncontrolled hypertension can cause heart attack, stroke, kidney disease, and other problems. What are blood pressure readings? A blood pressure reading consists of a higher number over a lower number. Ideally, your blood pressure should be below 120/80. The first ("top") number is called the systolic pressure. It is a measure of the pressure in your arteries as your heart beats. The second ("bottom") number is called the diastolic pressure. It is a measure of the pressure in your arteries as the heart relaxes. What does my blood pressure reading mean? Blood pressure is classified into four stages. Based on your blood pressure reading, your health care provider may use the following stages to determine what type of treatment you need, if any. Systolic pressure and diastolic pressure are measured in a unit called mm Hg. Normal  Systolic pressure: below 120.  Diastolic pressure: below 80. Elevated  Systolic pressure: 120-129.  Diastolic pressure: below 80. Hypertension stage 1  Systolic pressure: 130-139.  Diastolic pressure: 80-89. Hypertension stage 2  Systolic pressure: 140 or above.  Diastolic pressure: 90 or above. What health risks are associated with hypertension? Managing your hypertension is an important responsibility. Uncontrolled hypertension can lead to:  A heart attack.  A stroke.  A weakened blood vessel (aneurysm).  Heart failure.  Kidney damage.  Eye damage.  Metabolic syndrome.  Memory and concentration problems. What changes can I make to manage my  hypertension? Hypertension can be managed by making lifestyle changes and possibly by taking medicines. Your health care provider will help you make a plan to bring your blood pressure within a normal range. Eating and drinking   Eat a diet that is high in fiber and potassium, and low in salt (sodium), added sugar, and fat. An example eating plan is called the DASH (Dietary Approaches to Stop Hypertension) diet. To eat this way: ? Eat plenty of fresh fruits and vegetables. Try to fill half of your plate at each meal with fruits and vegetables. ? Eat whole grains, such as whole wheat pasta, brown rice, or whole grain bread. Fill about one quarter of your plate with whole grains. ? Eat low-fat diary products. ? Avoid fatty cuts of meat, processed or cured meats, and poultry with skin. Fill about one quarter of your plate with lean proteins such as fish, chicken without skin, beans, eggs, and tofu. ? Avoid premade and processed foods. These tend to be higher in sodium, added sugar, and fat.  Reduce your daily sodium intake. Most people with hypertension should eat less than 1,500 mg of sodium a day.  Limit alcohol intake to no more than 1 drink a day for nonpregnant women and 2 drinks a day for men. One drink equals 12 oz of beer, 5 oz of wine, or 1 oz of hard liquor. Lifestyle  Work with your health care provider to maintain a healthy body weight, or to lose weight. Ask what an ideal weight is for you.  Get at least 30 minutes of exercise that causes your heart to beat faster (aerobic exercise) most days of the week. Activities may include walking, swimming, or biking.    Include exercise to strengthen your muscles (resistance exercise), such as weight lifting, as part of your weekly exercise routine. Try to do these types of exercises for 30 minutes at least 3 days a week.  Do not use any products that contain nicotine or tobacco, such as cigarettes and e-cigarettes. If you need help quitting,  ask your health care provider.  Control any long-term (chronic) conditions you have, such as high cholesterol or diabetes. Monitoring  Monitor your blood pressure at home as told by your health care provider. Your personal target blood pressure may vary depending on your medical conditions, your age, and other factors.  Have your blood pressure checked regularly, as often as told by your health care provider. Working with your health care provider  Review all the medicines you take with your health care provider because there may be side effects or interactions.  Talk with your health care provider about your diet, exercise habits, and other lifestyle factors that may be contributing to hypertension.  Visit your health care provider regularly. Your health care provider can help you create and adjust your plan for managing hypertension. Will I need medicine to control my blood pressure? Your health care provider may prescribe medicine if lifestyle changes are not enough to get your blood pressure under control, and if:  Your systolic blood pressure is 130 or higher.  Your diastolic blood pressure is 80 or higher. Take medicines only as told by your health care provider. Follow the directions carefully. Blood pressure medicines must be taken as prescribed. The medicine does not work as well when you skip doses. Skipping doses also puts you at risk for problems. Contact a health care provider if:  You think you are having a reaction to medicines you have taken.  You have repeated (recurrent) headaches.  You feel dizzy.  You have swelling in your ankles.  You have trouble with your vision. Get help right away if:  You develop a severe headache or confusion.  You have unusual weakness or numbness, or you feel faint.  You have severe pain in your chest or abdomen.  You vomit repeatedly.  You have trouble breathing. Summary  Hypertension is when the force of blood pumping  through your arteries is too strong. If this condition is not controlled, it may put you at risk for serious complications.  Your personal target blood pressure may vary depending on your medical conditions, your age, and other factors. For most people, a normal blood pressure is less than 120/80.  Hypertension is managed by lifestyle changes, medicines, or both. Lifestyle changes include weight loss, eating a healthy, low-sodium diet, exercising more, and limiting alcohol. This information is not intended to replace advice given to you by your health care provider. Make sure you discuss any questions you have with your health care provider. Document Revised: 03/20/2019 Document Reviewed: 10/24/2016 Elsevier Patient Education  2020 Elsevier Inc.  

## 2020-09-16 ENCOUNTER — Encounter: Payer: Self-pay | Admitting: Family Medicine

## 2020-09-16 ENCOUNTER — Ambulatory Visit (INDEPENDENT_AMBULATORY_CARE_PROVIDER_SITE_OTHER): Payer: Self-pay | Admitting: Family Medicine

## 2020-09-16 ENCOUNTER — Other Ambulatory Visit: Payer: Self-pay

## 2020-09-16 VITALS — BP 107/86 | HR 87 | Temp 98.1°F | Ht 69.0 in | Wt 288.2 lb

## 2020-09-16 DIAGNOSIS — E66813 Obesity, class 3: Secondary | ICD-10-CM

## 2020-09-16 DIAGNOSIS — R200 Anesthesia of skin: Secondary | ICD-10-CM

## 2020-09-16 DIAGNOSIS — R42 Dizziness and giddiness: Secondary | ICD-10-CM

## 2020-09-16 DIAGNOSIS — R29898 Other symptoms and signs involving the musculoskeletal system: Secondary | ICD-10-CM

## 2020-09-16 DIAGNOSIS — G4700 Insomnia, unspecified: Secondary | ICD-10-CM

## 2020-09-16 DIAGNOSIS — F419 Anxiety disorder, unspecified: Secondary | ICD-10-CM

## 2020-09-16 DIAGNOSIS — G939 Disorder of brain, unspecified: Secondary | ICD-10-CM

## 2020-09-16 DIAGNOSIS — M5441 Lumbago with sciatica, right side: Secondary | ICD-10-CM

## 2020-09-16 DIAGNOSIS — I10 Essential (primary) hypertension: Secondary | ICD-10-CM

## 2020-09-16 DIAGNOSIS — Z09 Encounter for follow-up examination after completed treatment for conditions other than malignant neoplasm: Secondary | ICD-10-CM

## 2020-09-16 DIAGNOSIS — G43909 Migraine, unspecified, not intractable, without status migrainosus: Secondary | ICD-10-CM

## 2020-09-16 MED ORDER — CYCLOBENZAPRINE HCL 10 MG PO TABS: 10.0000 mg | ORAL_TABLET | Freq: Three times a day (TID) | ORAL | 3 refills | Status: DC | PRN

## 2020-09-16 MED ORDER — TRAZODONE HCL 100 MG PO TABS: 100.0000 mg | ORAL_TABLET | Freq: Every evening | ORAL | 3 refills | Status: DC | PRN

## 2020-09-16 NOTE — Progress Notes (Signed)
[facility]   Established Patient Office Visit  Subjective:  Patient ID: Elaine [name], female    DOB: [birth year]  Age: 39 y.o. MRN: [id number]  CC:  Chief Complaint  Patient presents with  . Follow-up  . Back Pain    Pt states she pulled a muscle in her back. Pt states she doesn't remember what she was doing . Angelina Sheriff    HPI Elaine [name] is a 39 year old female who presents for Follow Up today.    Patient Active Problem List   Diagnosis Date Noted  . Ovarian cyst   . Appendicitis 07/29/2018   Current Status: Since her last office visit, she has c/o back pain with radiation to right leg X 1 week now. She has history of Social Anxiety with history of admittance to [employer] as a teenager. She denies suicidal ideations, homicidal ideations, or auditory hallucinations. She denies fevers, chills, fatigue, recent infections, weight loss, and night sweats. She has not had any visual changes, and falls. No chest pain, heart palpitations, cough and shortness of breath reported. Denies GI problems such as nausea, vomiting, diarrhea, and constipation. She has no reports of blood in stools, dysuria and hematuria. She is taking all medications as prescribed.    Patient Active Problem List   Diagnosis Date Noted  . Ovarian cyst   . Appendicitis 07/29/2018    Past Medical History:  Diagnosis Date  . Anorexia   . Anxiety   . Dysrhythmia   . Gall stones   . Headache   . Hypermobility of joint    Ehler Danlos Syndrome  . Hypertension   . IBS (irritable bowel syndrome)   . Infection    bladder  . Jaundice   . Mastoiditis   . Migraine   . Morton neuroma   . Ovarian cyst   . Pancreatitis   . PCOS (polycystic ovarian syndrome)   . Psoriasis   . Psoriasis   . Scoliosis   . Vitamin D deficiency 08/2020    Past Surgical History:  Procedure Laterality Date  . DILATION AND CURETTAGE OF UTERUS  2002  . LAPAROSCOPIC APPENDECTOMY N/A  07/29/2018   Procedure: APPENDECTOMY LAPAROSCOPIC;  Surgeon: Emelia Loron, MD;  Location: [facility] OR;  Service: General;  Laterality: N/A;  . LAPAROSCOPIC CHOLECYSTECTOMY  2006    Family History  Problem Relation Age of Onset  . Thyroid disease Mother   . Heart disease Father   . Cancer Father        skin  . Osteoarthritis Father   . Colon cancer Maternal Grandmother     Social History   Socioeconomic History  . Marital status: Married    Spouse name: Not on file  . Number of children: Not on file  . Years of education: Not on file  . Highest education level: Not on file  Occupational History  . Not on file  Tobacco Use  . Smoking status: Former Smoker    Packs/day: 1.00    Types: Cigarettes  . Smokeless tobacco: Never Used  Substance and Sexual Activity  . Alcohol use: No  . Drug use: No  . Sexual activity: Yes    Birth control/protection: Surgical    Comment: vasectomy in spouse  Other Topics Concern  . Not on file  Social History Narrative  . Not on file   Social Determinants of Health   Financial Resource Strain:   . Difficulty of Paying Living  Expenses: Not on file  Food Insecurity:   . Worried About Programme researcher, broadcasting/film/video in the Last Year: Not on file  . Ran Out of Food in the Last Year: Not on file  Transportation Needs:   . Lack of Transportation (Medical): Not on file  . Lack of Transportation (Non-Medical): Not on file  Physical Activity:   . Days of Exercise per Week: Not on file  . Minutes of Exercise per Session: Not on file  Stress:   . Feeling of Stress : Not on file  Social Connections:   . Frequency of Communication with Friends and Family: Not on file  . Frequency of Social Gatherings with Friends and Family: Not on file  . Attends Religious Services: Not on file  . Active Member of Clubs or Organizations: Not on file  . Attends Banker Meetings: Not on file  . Marital Status: Not on file  Intimate Partner Violence:   . Fear  of Current or Ex-Partner: Not on file  . Emotionally Abused: Not on file  . Physically Abused: Not on file  . Sexually Abused: Not on file    Outpatient Medications Prior to Visit  Medication Sig Dispense Refill  . acetaminophen (TYLENOL) 500 MG tablet Take 2 tablets (1,000 mg total) by mouth every 6 (six) hours as needed. 30 tablet 0  . amLODipine (NORVASC) 10 MG tablet Take 1 tablet (10 mg total) by mouth daily. 90 tablet 3  . B Complex-C (B-COMPLEX WITH VITAMIN C) tablet Take 1 tablet by mouth daily.    . Biotin 1000 MCG tablet Take 1,000 mcg by mouth daily.    . cetirizine (ZYRTEC) 10 MG tablet Take 10 mg by mouth every morning.     . chlorpheniramine (ALLER-CHLOR) 4 MG tablet Take 4 mg by mouth every evening.    . metoprolol tartrate (LOPRESSOR) 50 MG tablet Take 1 tablet (50 mg total) by mouth 2 (two) times daily. 180 tablet 3  . olmesartan (BENICAR) 20 MG tablet Take 1 tablet (20 mg total) by mouth daily. 90 tablet 3  . omeprazole (PRILOSEC) 20 MG capsule Take 20 mg by mouth daily.     . Vitamin D, Ergocalciferol, (DRISDOL) 1.25 MG (50000 UNIT) CAPS capsule Take 1 capsule (50,000 Units total) by mouth every 7 (seven) days. 5 capsule 3  . budesonide (RHINOCORT ALLERGY) 32 MCG/ACT nasal spray Place 1 spray into both nostrils every evening.  (Patient not taking: Reported on 09/16/2020)     Facility-Administered Medications Prior to Visit  Medication Dose Route Frequency Provider Last Rate Last Admin  . cloNIDine (CATAPRES) tablet 0.2 mg  0.2 mg Oral Once Barbette Merino, NP        Allergies  Allergen Reactions  . Sulfa Antibiotics Nausea Only  . Ciprofloxacin     Has higher risk of stroke if taken    ROS Review of Systems  Constitutional: Negative.   HENT: Negative.   Eyes: Negative.   Respiratory: Negative.   Cardiovascular: Negative.   Gastrointestinal: Positive for abdominal pain (general ).  Endocrine: Negative.   Genitourinary: Negative.   Musculoskeletal: Positive  for arthralgias (generalized).  Skin: Negative.   Allergic/Immunologic: Negative.   Neurological: Positive for dizziness (occasional ), weakness (right extremity), numbness (right extremity) and headaches (occasional ).  Hematological: Negative.   Psychiatric/Behavioral: Negative.     Objective:    Physical Exam Vitals and nursing note reviewed.  Constitutional:      Appearance: Normal appearance.  HENT:  Head: Normocephalic and atraumatic.     Nose: Nose normal.     Mouth/Throat:     Mouth: Mucous membranes are moist.     Pharynx: Oropharynx is clear.  Cardiovascular:     Rate and Rhythm: Normal rate and regular rhythm.     Pulses: Normal pulses.     Heart sounds: Normal heart sounds.  Pulmonary:     Effort: Pulmonary effort is normal.     Breath sounds: Normal breath sounds.  Abdominal:     General: Bowel sounds are normal. There is distension (obese).     Palpations: Abdomen is soft.  Musculoskeletal:        General: Normal range of motion.     Cervical back: Normal range of motion and neck supple.  Skin:    General: Skin is warm and dry.  Neurological:     General: No focal deficit present.     Mental Status: She is alert and oriented to person, place, and time.  Psychiatric:        Mood and Affect: Mood normal.        Behavior: Behavior normal.        Thought Content: Thought content normal.        Judgment: Judgment normal.     BP 107/86 (BP Location: Left Arm, Patient Position: Sitting, Cuff Size: Large)   Pulse 87   Temp 98.1 F (36.7 C)   Ht  (1.753 m)   Wt 288 lb 3.2 oz (130.7 kg)   LMP 08/22/2020 (Approximate)   SpO2 100%   BMI 42.56 kg/m  Wt Readings from Last 3 Encounters:  09/16/20 288 lb 3.2 oz (130.7 kg)  08/09/20 286 lb 6.4 oz (129.9 kg)  06/20/20 195 lb (88.5 kg)     Health Maintenance Due  Topic Date Due  . TETANUS/TDAP  Never done  . PAP SMEAR-Modifier  08/30/2018  . INFLUENZA VACCINE  Never done    There are no  preventive care reminders to display for this patient.  Lab Results  Component Value Date   TSH 2.940 08/09/2020   Lab Results  Component Value Date   WBC 6.6 08/09/2020   HGB 14.4 08/09/2020   HCT 44.1 08/09/2020   MCV 89 08/09/2020   PLT 313 08/09/2020   Lab Results  Component Value Date   NA 139 08/09/2020   K 4.0 08/09/2020   CO2 24 08/09/2020   GLUCOSE 107 (H) 08/09/2020   BUN 8 08/09/2020   CREATININE 0.66 08/09/2020   BILITOT 0.4 08/09/2020   ALKPHOS 74 08/09/2020   AST 10 08/09/2020   ALT 11 08/09/2020   PROT 7.1 08/09/2020   ALBUMIN 4.2 08/09/2020   CALCIUM 9.4 08/09/2020   ANIONGAP 11 06/20/2020   Lab Results  Component Value Date   CHOL 197 08/09/2020   Lab Results  Component Value Date   HDL 55 08/09/2020   Lab Results  Component Value Date   LDLCALC 122 (H) 08/09/2020   Lab Results  Component Value Date   TRIG 113 08/09/2020   Lab Results  Component Value Date   CHOLHDL 3.6 08/09/2020   Lab Results  Component Value Date   HGBA1C 5.1 08/09/2020   HGBA1C 5.1 08/09/2020   HGBA1C 5.1 (A) 08/09/2020   HGBA1C 5.1 08/09/2020      Assessment & Plan:        1. Essential hypertension The current medical regimen is effective; her blood pressure is stable at 107/86 today; continue  present plan and medications as prescribed. She will continue to take medications as prescribed, to decrease high sodium intake, excessive alcohol intake, increase potassium intake, smoking cessation, and increase physical activity of at least 30 minutes of cardio activity daily. She will continue to follow Heart Healthy or DASH diet.  2. Migraine without status migrainosus, not intractable, unspecified migraine type Stable. Monitor.   3. Anxiety Moderate today. She declines Psychiatric Services, nor any anxiety/depression medications at this time. I will refer her to our Social Worker, Abigail ButtsSusan Porter for further assessment.   4. Numbness in right leg  5.  Dizziness  6. Chronic non-specific white matter lesions on MRI Declines order for MRI and Neurology Referral at this time because of uninsured and financial status. She will complete application for [employer] [facility]([place] [employer] Assistant [facility]ucker[facility]) as soon as possible.   7. Weakness of both legs  8. Class 3 severe obesity due to excess calories with serious comorbidity in adult, unspecified BMI (HCC) Body mass index is 42.56 kg/m. Goal BMI  is <30. Encouraged efforts to reduce weight include engaging in physical activity as tolerated with goal of 150 minutes per week. Improve dietary choices and eat a meal regimen consistent with a Mediterranean or DASH diet. Reduce simple carbohydrates. Do not skip meals and eat healthy snacks throughout the day to avoid over-eating at dinner. Set a goal weight loss that is achievable for you.  9. Morbid obesity (HCC)  10. Right-sided low back pain with right-sided sciatica, unspecified chronicity - cyclobenzaprine (FLEXERIL) 10 MG tablet; Take 1 tablet (10 mg total) by mouth 3 (three) times daily as needed for muscle spasms.  Dispense: 90 tablet; Refill: 3  11. Insomnia, unspecified type - traZODone (DESYREL) 100 MG tablet; Take 1 tablet (100 mg total) by mouth at bedtime as needed for sleep.  Dispense: 30 tablet; Refill: 3  12. Follow up She will follow up in 2 months.   Meds ordered this encounter  Medications  . cyclobenzaprine (FLEXERIL) 10 MG tablet    Sig: Take 1 tablet (10 mg total) by mouth 3 (three) times daily as needed for muscle spasms.    Dispense:  90 tablet    Refill:  3  . traZODone (DESYREL) 100 MG tablet    Sig: Take 1 tablet (100 mg total) by mouth at bedtime as needed for sleep.    Dispense:  30 tablet    Refill:  3    No orders of the defined types were placed in this encounter.   Referral Orders  No referral(s) requested today    Raliegh IpNatalie Sherece Gambrill,  MSN, FNP-BC [facility] [address]  [place], [place] [id number] [phone] fax  Problem List Items Addressed This Visit    None    Visit Diagnoses    Essential hypertension    -  Primary   Migraine without status migrainosus, not intractable, unspecified migraine type       Relevant Medications   cyclobenzaprine (FLEXERIL) 10 MG tablet   traZODone (DESYREL) 100 MG tablet   Anxiety       Relevant Medications   traZODone (DESYREL) 100 MG tablet   Numbness in right leg       Dizziness       Chronic 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 cyclobenzaprine (FLEXERIL) 10 MG tablet    Sig: Take 1 tablet (10 mg total) by mouth 3 (three) times daily as needed for muscle spasms.    Dispense:  90 tablet    Refill:  3  . traZODone (DESYREL) 100 MG tablet    Sig: Take 1 tablet (100 mg total) by mouth at bedtime as needed for sleep.    Dispense:  30 tablet    Refill:  3    Follow-up: Return in about 2 months (around 11/16/2020).    Kallie Locks, FNP

## 2020-09-16 NOTE — Patient Instructions (Addendum)
Social Anxiety Disorder, Adult Social anxiety disorder (SAD), previously called social phobia, is a mental health condition. People with SAD often feel nervous, afraid, or embarrassed when they are around other people in social situations. They worry that other people are judging or criticizing them for how they look, what they say, or how they act. SAD involves more than just feeling shy or self-conscious at times. It can cause severe emotional distress. It can interfere with activities of daily life. SAD also may lead to alcohol or drug use, and even suicide. SAD is a common mental health condition. It can develop at any time, but it usually starts in the teenage years. What are the causes? The cause of this condition is not known. It may involve genes that are passed through families. Stressful events may trigger anxiety. This disorder is also associated with an overactive amygdala. The amygdala is the part of the brain that triggers your response to strong feelings, such as fear. What increases the risk? This condition is more likely to develop in:  People who have a family history of anxiety disorders.  Women.  People who have a physical or behavioral condition that makes them feel self-conscious or nervous, such as a stutter or a long-term (chronic) disease. What are the signs or symptoms? The main symptom of this condition is fear of embarrassment caused by being criticized or judged in social situations. You may be afraid to:  Speak in public.  Go shopping.  Use a public bathroom.  Eat at a restaurant.  Go to work.  Interact with people you do not know. Extreme fear and anxiety may cause physical symptoms, including:  Blushing.  A fast heartbeat.  Sweating.  Shaky hands or voice.  Confusion.  Light-headedness.  Upset stomach, diarrhea, or vomiting.  Shortness of breath. How is this diagnosed? This condition is diagnosed based on your history, symptoms, and  behavior in social situations. You may be diagnosed with this type of anxiety if your symptoms have lasted for more than 6 months and have been present on more days than not. Your health care provider may ask you about your use of alcohol, drugs, and prescription medicines. He or she may also refer you to a mental health specialist for further evaluation or treatment. How is this treated? Treatment for this condition may include:  Cognitive behavioral therapy (CBT). This type of talk therapy helps you learn to replace negative thoughts and behaviors with positive ones. This may include learning how to use self-calming skills and other methods of managing your anxiety.  Exposure therapy. You will be exposed to social situations that cause you fear. The treatment starts with practicing self-calming in situations that cause you low levels of fear. Over time, you will progress by sustaining self-calming and managing harder situations.  Antidepressant medicines. These medicines may be used by themselves or in addition to other therapies.  Biofeedback. This process trains you to manage your body's response (physiological response) through breathing techniques and relaxation methods. You will work with a therapist while machines are used to monitor your physical symptoms.  Techniques for relaxation and managing anxiety. These include deep breathing, self-talk, meditation, visual imagery, muscle relaxation, music therapy, and yoga. These techniques are often used with other therapies to keep you calm in situations that cause you anxiety. These treatments are often used in combination. Follow these instructions at home: Alcohol use If you drink alcohol:  Limit how much you use to: ? 0-1 drink a day for   nonpregnant women. ? 0-2 drinks a day for men.  Be aware of how much alcohol is in your drink. In the [place] one drink equals one 12 oz bottle of beer (355 mL), one 5 oz glass of wine (148 mL), or one 1  oz glass of hard liquor (44 mL). General instructions  Take over-the-counter and prescription medicines only as told by your health care provider.  Practice techniques for relaxation and managing anxiety at times you are not challenged by social anxiety.  Return to social activities using techniques you have learned, as you feel ready to do so.  Avoid caffeine and certain over-the-counter cold medicines. These may make you feel worse. Ask your pharmacists which medicines to avoid.  Keep all follow-up visits as told by your health care provider. This is important. Where to find more information  [employer] Mental [employer]ness (NAMI): https://www.nami.org  Social Anxiety Association: https://socialphobia.org  Mental Health America [facility]): https://www.stevens-henderson.com/  Anxiety and Depression Association of [place] (ADAA): http://miller-hamilton.net/ Contact a health care provider if:  Your symptoms do not improve or get worse.  You have signs of depression, such as: ? Persistent sadness or moodiness. ? Loss of enjoyment in activities that used to bring you joy. ? Change in weight or eating. ? Changes in sleeping habits. ? Avoiding friends or family members more than usual. ? Loss of energy for normal tasks. ? Feeling guilty or worthless.  You become more isolated than you normally are.  You find it more and more difficult to speak or interact with others.  You are using drugs.  You are drinking more alcohol than usual. Get help right away if:  You harm yourself.  You have suicidal thoughts. If you ever feel like you may hurt yourself or others, or have thoughts about taking your own life, get help right away. You can go to your nearest emergency department or call:  Your local emergency services (911 in the [place]  A suicide crisis helpline, such as the National Suicide Prevention Lifeline at 424-861-5083. This is open 24 hours a day. Summary  Social anxiety disorder  (SAD) may cause you to feel nervous, afraid, or embarrassed when you are around other people in social situations.  SAD is a common mental disorder. It can develop at any time, but it usually starts in the teenage years.  Treatment includes talk therapy, exposure therapy, medicines, biofeedback, and relaxation techniques. It can involve a combination of treatments. This information is not intended to replace advice given to you by your health care provider. Make sure you discuss any questions you have with your health care provider. Document Revised: 04/29/2019 Document Reviewed: 04/29/2019 Elsevier Patient Education  2020 Else[employer] Inc. Cyclobenzaprine tablets What is this medicine? CYCLOBENZAPRINE (sye kloe BEN za preen) is a muscle relaxer. It is used to treat muscle pain, spasms, and stiffness. This medicine may be used for other purposes; ask your health care provider or pharmacist if you have questions. COMMON BRAND NAME(S): Fexmid, Flexeril What should I tell my health care provider before I take this medicine? They need to know if you have any of these conditions:  heart disease, irregular heartbeat, or previous heart attack  liver disease  thyroid problem  an unusual or allergic reaction to cyclobenzaprine, tricyclic antidepressants, lactose, other medicines, foods, dyes, or preservatives  pregnant or trying to get pregnant  breast-feeding How should I use this medicine? Take this medicine by mouth with a glass of water. Follow the directions on the  prescription label. If this medicine upsets your stomach, take it with food or milk. Take your medicine at regular intervals. Do not take it more often than directed. Talk to your pediatrician regarding the use of this medicine in children. Special care may be needed. Overdosage: If you think you have taken too much of this medicine contact a poison control center or emergency room at once. NOTE: This medicine is only for you. Do  not share this medicine with others. What if I miss a dose? If you miss a dose, take it as soon as you can. If it is almost time for your next dose, take only that dose. Do not take double or extra doses. What may interact with this medicine? Do not take this medicine with any of the following medications:  MAOIs like Carbex, Eldepryl, Marplan, Nardil, and Parnate  narcotic medicines for cough  safinamide This medicine may also interact with the following medications:  alcohol  bupropion  antihistamines for allergy, cough and cold  certain medicines for anxiety or sleep  certain medicines for bladder problems like oxybutynin, tolterodine  certain medicines for depression like amitriptyline, fluoxetine, sertraline  certain medicines for Parkinson's disease like benztropine, trihexyphenidyl  certain medicines for seizures like phenobarbital, primidone  certain medicines for stomach problems like dicyclomine, hyoscyamine  certain medicines for travel sickness like scopolamine  general anesthetics like halothane, isoflurane, methoxyflurane, propofol  ipratropium  local anesthetics like lidocaine, pramoxine, tetracaine  medicines that relax muscles for surgery  narcotic medicines for pain  phenothiazines like chlorpromazine, mesoridazine, prochlorperazine, thioridazine  verapamil This list may not describe all possible interactions. Give your health care provider a list of all the medicines, herbs, non-prescription drugs, or dietary supplements you use. Also tell them if you smoke, drink alcohol, or use illegal drugs. Some items may interact with your medicine. What should I watch for while using this medicine? Tell your doctor or health care professional if your symptoms do not start to get better or if they get worse. You may get drowsy or dizzy. Do not drive, use machinery, or do anything that needs mental alertness until you know how this medicine affects you. Do not  stand or sit up quickly, especially if you are an older patient. This reduces the risk of dizzy or fainting spells. Alcohol may interfere with the effect of this medicine. Avoid alcoholic drinks. If you are taking another medicine that also causes drowsiness, you may have more side effects. Give your health care provider a list of all medicines you use. Your doctor will tell you how much medicine to take. Do not take more medicine than directed. Call emergency for help if you have problems breathing or unusual sleepiness. Your mouth may get dry. Chewing sugarless gum or sucking hard candy, and drinking plenty of water may help. Contact your doctor if the problem does not go away or is severe. What side effects may I notice from receiving this medicine? Side effects that you should report to your doctor or health care professional as soon as possible:  allergic reactions like skin rash, itching or hives, swelling of the face, lips, or tongue  breathing problems  chest pain  fast, irregular heartbeat  hallucinations  seizures  unusually weak or tired Side effects that usually do not require medical attention (report to your doctor or health care professional if they continue or are bothersome):  headache  nausea, vomiting This list may not describe all possible side effects. Call your doctor for  medical advice about side effects. You may report side effects to FDA at 1-800-FDA-1088. Where should I keep my medicine? Keep out of the reach of children. Store at room temperature between 15 and 30 degrees C (59 and 86 degrees F). Keep container tightly closed. Throw away any unused medicine after the expiration date. NOTE: This sheet is a summary. It may not cover all possible information. If you have questions about this medicine, talk to your doctor, pharmacist, or health care provider.  2020 Elsevier/Gold Standard (2018-10-29 12:49:26) Trazodone tablets What is this medicine? TRAZODONE  (TRAZ oh done) is used to treat depression. This medicine may be used for other purposes; ask your health care provider or pharmacist if you have questions. COMMON BRAND NAME(S): Desyrel What should I tell my health care provider before I take this medicine? They need to know if you have any of these conditions:  attempted suicide or thinking about it  bipolar disorder  bleeding problems  glaucoma  heart disease, or previous heart attack  irregular heart beat  kidney or liver disease  low levels of sodium in the blood  an unusual or allergic reaction to trazodone, other medicines, foods, dyes or preservatives  pregnant or trying to get pregnant  breast-feeding How should I use this medicine? Take this medicine by mouth with a glass of water. Follow the directions on the prescription label. Take this medicine shortly after a meal or a light snack. Take your medicine at regular intervals. Do not take your medicine more often than directed. Do not stop taking this medicine suddenly except upon the advice of your doctor. Stopping this medicine too quickly may cause serious side effects or your condition may worsen. A special MedGuide will be given to you by the pharmacist with each prescription and refill. Be sure to read this information carefully each time. Talk to your pediatrician regarding the use of this medicine in children. Special care may be needed. Overdosage: If you think you have taken too much of this medicine contact a poison control center or emergency room at once. NOTE: This medicine is only for you. Do not share this medicine with others. What if I miss a dose? If you miss a dose, take it as soon as you can. If it is almost time for your next dose, take only that dose. Do not take double or extra doses. What may interact with this medicine? Do not take this medicine with any of the following medications:  certain medicines for fungal infections like fluconazole,  itraconazole, ketoconazole, posaconazole, voriconazole  cisapride  dronedarone  linezolid  MAOIs like Carbex, Eldepryl, Marplan, Nardil, and Parnate  mesoridazine  methylene blue (injected into a vein)  pimozide  saquinavir  thioridazine This medicine may also interact with the following medications:  alcohol  antiviral medicines for HIV or AIDS  aspirin and aspirin-like medicines  barbiturates like phenobarbital  certain medicines for blood pressure, heart disease, irregular heart beat  certain medicines for depression, anxiety, or psychotic disturbances  certain medicines for migraine headache like almotriptan, eletriptan, frovatriptan, naratriptan, rizatriptan, sumatriptan, zolmitriptan  certain medicines for seizures like carbamazepine and phenytoin  certain medicines for sleep  certain medicines that treat or prevent blood clots like dalteparin, enoxaparin, warfarin  digoxin  fentanyl  lithium  NSAIDS, medicines for pain and inflammation, like ibuprofen or naproxen  other medicines that prolong the QT interval (cause an abnormal heart rhythm) like dofetilide  rasagiline  supplements like St. John's wort, kava kava, valerian  tramadol  tryptophan This list may not describe all possible interactions. Give your health care provider a list of all the medicines, herbs, non-prescription drugs, or dietary supplements you use. Also tell them if you smoke, drink alcohol, or use illegal drugs. Some items may interact with your medicine. What should I watch for while using this medicine? Tell your doctor if your symptoms do not get better or if they get worse. Visit your doctor or health care professional for regular checks on your progress. Because it may take several weeks to see the full effects of this medicine, it is important to continue your treatment as prescribed by your doctor. Patients and their families should watch out for new or worsening thoughts  of suicide or depression. Also watch out for sudden changes in feelings such as feeling anxious, agitated, panicky, irritable, hostile, aggressive, impulsive, severely restless, overly excited and hyperactive, or not being able to sleep. If this happens, especially at the beginning of treatment or after a change in dose, call your health care professional. Bonita Quin may get drowsy or dizzy. Do not drive, use machinery, or do anything that needs mental alertness until you know how this medicine affects you. Do not stand or sit up quickly, especially if you are an older patient. This reduces the risk of dizzy or fainting spells. Alcohol may interfere with the effect of this medicine. Avoid alcoholic drinks. This medicine may cause dry eyes and blurred vision. If you wear contact lenses you may feel some discomfort. Lubricating drops may help. See your eye doctor if the problem does not go away or is severe. Your mouth may get dry. Chewing sugarless gum, sucking hard candy and drinking plenty of water may help. Contact your doctor if the problem does not go away or is severe. What side effects may I notice from receiving this medicine? Side effects that you should report to your doctor or health care professional as soon as possible:  allergic reactions like skin rash, itching or hives, swelling of the face, lips, or tongue  elevated mood, decreased need for sleep, racing thoughts, impulsive behavior  confusion  fast, irregular heartbeat  feeling faint or lightheaded, falls  feeling agitated, angry, or irritable  loss of balance or coordination  painful or prolonged erections  restlessness, pacing, inability to keep still  suicidal thoughts or other mood changes  tremors  trouble sleeping  seizures  unusual bleeding or bruising Side effects that usually do not require medical attention (report to your doctor or health care professional if they continue or are bothersome):  change in sex  drive or performance  change in appetite or weight  constipation  headache  muscle aches or pains  nausea This list may not describe all possible side effects. Call your doctor for medical advice about side effects. You may report side effects to FDA at 1-800-FDA-1088. Where should I keep my medicine? Keep out of the reach of children. Store at room temperature between 15 and 30 degrees C (59 to 86 degrees F). Protect from light. Keep container tightly closed. Throw away any unused medicine after the expiration date. NOTE: This sheet is a summary. It may not cover all possible information. If you have questions about this medicine, talk to your doctor, pharmacist, or health care provider.  2020 Elsevier/Gold Standard (2018-11-18 11:46:46)

## 2020-09-21 ENCOUNTER — Encounter: Payer: Self-pay | Admitting: Family Medicine

## 2020-11-16 ENCOUNTER — Ambulatory Visit: Payer: Self-pay | Admitting: Family Medicine

## 2021-02-08 ENCOUNTER — Ambulatory Visit: Payer: Self-pay | Admitting: Family Medicine

## 2021-08-03 ENCOUNTER — Other Ambulatory Visit: Payer: Self-pay | Admitting: Nurse Practitioner

## 2021-08-03 DIAGNOSIS — I10 Essential (primary) hypertension: Secondary | ICD-10-CM

## 2021-08-09 ENCOUNTER — Other Ambulatory Visit: Payer: Self-pay | Admitting: Nurse Practitioner

## 2021-08-09 ENCOUNTER — Telehealth: Payer: Self-pay

## 2021-08-09 DIAGNOSIS — I10 Essential (primary) hypertension: Secondary | ICD-10-CM

## 2021-08-09 MED ORDER — METOPROLOL TARTRATE 50 MG PO TABS: 50.0000 mg | ORAL_TABLET | Freq: Two times a day (BID) | ORAL | 3 refills | Status: DC

## 2021-08-09 MED ORDER — AMLODIPINE BESYLATE 10 MG PO TABS: 10.0000 mg | ORAL_TABLET | Freq: Every day | ORAL | 3 refills | Status: DC

## 2021-08-09 MED ORDER — OLMESARTAN MEDOXOMIL 20 MG PO TABS: 20.0000 mg | ORAL_TABLET | Freq: Every day | ORAL | 3 refills | Status: DC

## 2021-08-09 NOTE — Telephone Encounter (Signed)
Olmesartan Amlodipine Metoprolol

## 2021-08-09 NOTE — Telephone Encounter (Signed)
Medications refilled

## 2021-08-15 ENCOUNTER — Encounter: Payer: Self-pay | Admitting: Nurse Practitioner

## 2021-08-15 ENCOUNTER — Ambulatory Visit (INDEPENDENT_AMBULATORY_CARE_PROVIDER_SITE_OTHER): Payer: Self-pay | Admitting: Nurse Practitioner

## 2021-08-15 ENCOUNTER — Other Ambulatory Visit: Payer: Self-pay

## 2021-08-15 ENCOUNTER — Other Ambulatory Visit: Payer: Self-pay | Admitting: Nurse Practitioner

## 2021-08-15 ENCOUNTER — Ambulatory Visit: Payer: Self-pay | Admitting: Nurse Practitioner

## 2021-08-15 VITALS — BP 122/86 | HR 75 | Temp 98.0°F | Ht 68.5 in | Wt 292.0 lb

## 2021-08-15 DIAGNOSIS — Z0000 Encounter for general adult medical examination without abnormal findings: Secondary | ICD-10-CM

## 2021-08-15 DIAGNOSIS — I10 Essential (primary) hypertension: Secondary | ICD-10-CM

## 2021-08-15 DIAGNOSIS — R899 Unspecified abnormal finding in specimens from other organs, systems and tissues: Secondary | ICD-10-CM

## 2021-08-15 DIAGNOSIS — Z23 Encounter for immunization: Secondary | ICD-10-CM

## 2021-08-15 DIAGNOSIS — M25559 Pain in unspecified hip: Secondary | ICD-10-CM

## 2021-08-15 LAB — POCT URINALYSIS DIP (CLINITEK)
Bilirubin, UA: NEGATIVE
Glucose, UA: NEGATIVE mg/dL
Ketones, POC UA: NEGATIVE mg/dL
Nitrite, UA: NEGATIVE
POC PROTEIN,UA: NEGATIVE
Spec Grav, UA: 1.025 (ref 1.010–1.025)
Urobilinogen, UA: 0.2 E.U./dL
pH, UA: 5.5 (ref 5.0–8.0)

## 2021-08-15 NOTE — Addendum Note (Signed)
Addended by: Lindley Magnus L on: 08/15/2021 04:23 PM   Modules accepted: Orders

## 2021-08-15 NOTE — Patient Instructions (Signed)
You were seen today in the [facility] for follow up on chronic illness. Labs were collected, results will be available via MyChart or, if abnormal, you will be contacted by clinic staff.  Please follow up in 3 for reevaluation of chronic illness and pap smear.

## 2021-08-15 NOTE — Progress Notes (Signed)
[facility] Patient [facility] [address] [place], [place]  [address] Phone:  [phone]   Fax:  [phone] Subjective:   Patient ID: Elaine [name], female    DOB: [birth year], 40 y.o.   MRN: [id number]  Chief Complaint  Patient presents with   Follow-up    Pt is here today for her follow up visit. Pt is needing refills on her medications. Pt also states that she has been having pain in her hips x 1 year.   HPI Elaine [name] 40 y.o. female presents to the [facility] for reevaluation of chronic illness. Patient states, "I was told I needed to follow up for refill of my medications." States that medications were refilled last week. States that her bilateral hip pain has worsened over the past two years, increasing with standing and walking. Has been unable to be evaluated or treated for pain by a specialist due to financial constraints. States that she has tried several times to receive assistance, but was told she does not qualify. Declines any referrals at this time. 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 She has been taking tylenol with no improvement in pain. Was taking NSAIDS, which was helpful until she developed an ulcer. Pain is currently a 10/10. Offered patient consultation with LCSW regarding resources, but she declined. States that she was told she might have MS by previous provider and would like to save money for an MRI, requesting information about potential cost of screening.  Denies any other complaints today.  Past Medical History:  Diagnosis Date   Anorexia    Anxiety    Dysrhythmia    Gall stones    Headache    Hypermobility of joint    Ehler Danlos Syndrome   Hypertension    IBS (irritable bowel syndrome)    Infection    bladder   Jaundice    Mastoiditis    Migraine    Morton neuroma    Ovarian cyst    Pancreatitis    PCOS (polycystic ovarian syndrome)    Psoriasis    Psoriasis    Scoliosis    Vitamin D deficiency 08/2020    Past Surgical  History:  Procedure Laterality Date   DILATION AND CURETTAGE OF UTERUS  2002   LAPAROSCOPIC APPENDECTOMY N/A 07/29/2018   Procedure: APPENDECTOMY LAPAROSCOPIC;  Surgeon: Emelia Loron, MD;  Location: [facility] OR;  Service: General;  Laterality: N/A;   LAPAROSCOPIC CHOLECYSTECTOMY  2006    Family History  Problem Relation Age of Onset   Thyroid disease Mother    Heart disease Father    Cancer Father        skin   Osteoarthritis Father    Colon cancer Maternal Grandmother     Social History   Socioeconomic History   Marital status: Married    Spouse name: Not on file   Number of children: Not on file   Years of education: Not on file   Highest education level: Not on file  Occupational History   Not on file  Tobacco Use   Smoking status: Former    Packs/day: 1.00    Types: Cigarettes   Smokeless tobacco: Never  Vaping Use   Vaping Use: Never used  Substance and Sexual Activity   Alcohol use: Yes    Alcohol/week: 1.0 standard drink    Types: 1 Cans of beer per week    Comment: weekly   Drug use: No   Sexual activity: Yes  Birth control/protection: Surgical    Comment: vasectomy in spouse  Other Topics Concern   Not on file  Social History Narrative   Not on file   Social Determinants of Health   Financial Resource Strain: Not on file  Food Insecurity: Not on file  Transportation Needs: Not on file  Physical Activity: Not on file  Stress: Not on file  Social Connections: Not on file  Intimate Partner Violence: Not on file    Outpatient Medications Prior to Visit  Medication Sig Dispense Refill   amLODipine (NORVASC) 10 MG tablet Take 1 tablet (10 mg total) by mouth daily. 90 tablet 3   cetirizine (ZYRTEC) 10 MG tablet Take 10 mg by mouth every morning.      chlorpheniramine (CHLOR-TRIMETON) 4 MG tablet Take 4 mg by mouth every evening.     metoprolol tartrate (LOPRESSOR) 50 MG tablet Take 1 tablet (50 mg total) by mouth 2 (two) times daily. 180 tablet 3    olmesartan (BENICAR) 20 MG tablet Take 1 tablet (20 mg total) by mouth daily. 90 tablet 3   omeprazole (PRILOSEC) 20 MG capsule Take 20 mg by mouth daily.      Vitamin D, Ergocalciferol, (DRISDOL) 1.25 MG (50000 UNIT) CAPS capsule Take 1 capsule (50,000 Units total) by mouth every 7 (seven) days. 5 capsule 3   acetaminophen (TYLENOL) 500 MG tablet Take 2 tablets (1,000 mg total) by mouth every 6 (six) hours as needed. (Patient not taking: Reported on 08/15/2021) 30 tablet 0   B Complex-C (B-COMPLEX WITH VITAMIN C) tablet Take 1 tablet by mouth daily. (Patient not taking: Reported on 08/15/2021)     Biotin 1000 MCG tablet Take 1,000 mcg by mouth daily. (Patient not taking: Reported on 08/15/2021)     budesonide (RHINOCORT AQUA) 32 MCG/ACT nasal spray Place 1 spray into both nostrils every evening.  (Patient not taking: No sig reported)     cyclobenzaprine (FLEXERIL) 10 MG tablet Take 1 tablet (10 mg total) by mouth 3 (three) times daily as needed for muscle spasms. 90 tablet 3   traZODone (DESYREL) 100 MG tablet Take 1 tablet (100 mg total) by mouth at bedtime as needed for sleep. 30 tablet 3   cloNIDine (CATAPRES) tablet 0.2 mg      No facility-administered medications prior to visit.    Allergies  Allergen Reactions   Sulfa Antibiotics Nausea Only   Ciprofloxacin     Has higher risk of stroke if taken    Review of Systems  Constitutional:  Negative for chills, fever and malaise/fatigue.  Respiratory:  Negative for cough and shortness of breath.   Cardiovascular:  Negative for chest pain, palpitations and leg swelling.  Gastrointestinal:  Negative for abdominal pain, blood in stool, constipation, diarrhea, nausea and vomiting.  Musculoskeletal:  Positive for joint pain.  Skin: Negative.   Neurological: Negative.   Psychiatric/Behavioral:  Negative for depression. The patient is not nervous/anxious.   All other systems reviewed and are negative.     Objective:    Physical Exam Vitals  reviewed.  Constitutional:      General: She is not in acute distress.    Appearance: Normal appearance.  HENT:     Head: Normocephalic.  Cardiovascular:     Rate and Rhythm: Normal rate and regular rhythm.     Pulses: Normal pulses.     Heart sounds: Normal heart sounds.     Comments: No obvious peripheral edema Pulmonary:     Effort: Pulmonary effort is  normal.     Breath sounds: Normal breath sounds.  Musculoskeletal:        General: No swelling, tenderness, deformity or signs of injury. Normal range of motion.  Skin:    General: Skin is warm and dry.     Capillary Refill: Capillary refill takes less than 2 seconds.  Neurological:     General: No focal deficit present.     Mental Status: She is alert and oriented to person, place, and time.  Psychiatric:        Mood and Affect: Mood normal.        Behavior: Behavior normal.        Thought Content: Thought content normal.        Judgment: Judgment normal.    BP 122/86   Pulse 75   Temp 98 F (36.7 C)   Ht 5' 8.5" (1.74 m)   Wt 292 lb (132.5 kg)   SpO2 98%   BMI 43.75 kg/m  Wt Readings from Last 3 Encounters:  08/15/21 292 lb (132.5 kg)  09/16/20 288 lb 3.2 oz (130.7 kg)  08/09/20 286 lb 6.4 oz (129.9 kg)    Immunization History  Administered Date(s) Administered   Influenza,inj,Quad PF,6+ Mos 08/15/2021   PFIZER(Purple Top)SARS-COV-2 Vaccination 03/03/2020, 03/28/2020, 06/23/2020    Diabetic Foot Exam - Simple   No data filed     Lab Results  Component Value Date   TSH 2.940 08/09/2020   Lab Results  Component Value Date   WBC 6.6 08/09/2020   HGB 14.4 08/09/2020   HCT 44.1 08/09/2020   MCV 89 08/09/2020   PLT 313 08/09/2020   Lab Results  Component Value Date   NA 139 08/09/2020   K 4.0 08/09/2020   CO2 24 08/09/2020   GLUCOSE 107 (H) 08/09/2020   BUN 8 08/09/2020   CREATININE 0.66 08/09/2020   BILITOT 0.4 08/09/2020   ALKPHOS 74 08/09/2020   AST 10 08/09/2020   ALT 11 08/09/2020    PROT 7.1 08/09/2020   ALBUMIN 4.2 08/09/2020   CALCIUM 9.4 08/09/2020   ANIONGAP 11 06/20/2020   Lab Results  Component Value Date   CHOL 197 08/09/2020   CHOL  10/14/2007    163        ATP III CLASSIFICATION:  <200     mg/dL   Desirable  409-811  mg/dL   Borderline High  >=914    mg/dL   High   Lab Results  Component Value Date   HDL 55 08/09/2020   HDL 28 (L) 10/14/2007   Lab Results  Component Value Date   LDLCALC 122 (H) 08/09/2020   LDLCALC (H) 10/14/2007    114        Total Cholesterol/HDL:CHD Risk Coronary Heart Disease Risk Table                     Men   Women  1/2 Average Risk   3.4   3.3   Lab Results  Component Value Date   TRIG 113 08/09/2020   TRIG 104 10/14/2007   Lab Results  Component Value Date   CHOLHDL 3.6 08/09/2020   CHOLHDL 5.8 10/14/2007   Lab Results  Component Value Date   HGBA1C 5.1 08/09/2020   HGBA1C 5.1 08/09/2020   HGBA1C 5.1 (A) 08/09/2020   HGBA1C 5.1 08/09/2020       Assessment & Plan:   Problem List Items Addressed This Visit   None Visit Diagnoses  Essential hypertension    -  Primary   Relevant Orders   POCT URINALYSIS DIP (CLINITEK) (Completed)   Healthcare maintenance       Relevant Orders   Flu Vaccine QUAD 3278mo+IM (Fluarix, Fluzone & Alfiuria Quad PF) (Completed)   Lipid panel   Hemoglobin A1c   Comprehensive metabolic panel   CBC with Differential/Platelet   Hip pain     Patient informed to continue taking tylenol at home as needed for pain Encouraged patient to continue seeking resources for financial assistance so that she may receive the needed evaluation from specialist  Follow up in 3 mths for Pap smear        I am having Ricard DillonSarah L. Kleman maintain her omeprazole, acetaminophen, cetirizine, budesonide, chlorpheniramine, B-complex with vitamin C, Biotin, Vitamin D (Ergocalciferol), cyclobenzaprine, traZODone, amLODipine, metoprolol tartrate, and olmesartan. We will stop administering  cloNIDine.  No orders of the defined types were placed in this encounter.    Kathrynn Speedewana I Eliese Kerwood, NP

## 2021-08-16 LAB — COMPREHENSIVE METABOLIC PANEL
ALT: 8 IU/L (ref 0–32)
AST: 15 IU/L (ref 0–40)
Albumin/Globulin Ratio: 1.7 (ref 1.2–2.2)
Albumin: 4.3 g/dL (ref 3.8–4.8)
Alkaline Phosphatase: 71 IU/L (ref 44–121)
BUN/Creatinine Ratio: 9 (ref 9–23)
BUN: 6 mg/dL (ref 6–24)
Bilirubin Total: 0.3 mg/dL (ref 0.0–1.2)
CO2: 22 mmol/L (ref 20–29)
Calcium: 8.9 mg/dL (ref 8.7–10.2)
Chloride: 106 mmol/L (ref 96–106)
Creatinine, Ser: 0.69 mg/dL (ref 0.57–1.00)
Globulin, Total: 2.6 g/dL (ref 1.5–4.5)
Glucose: 115 mg/dL — ABNORMAL HIGH (ref 65–99)
Potassium: 4.3 mmol/L (ref 3.5–5.2)
Sodium: 141 mmol/L (ref 134–144)
Total Protein: 6.9 g/dL (ref 6.0–8.5)
eGFR: 112 mL/min/{1.73_m2} (ref >59)

## 2021-08-16 LAB — LIPID PANEL
Chol/HDL Ratio: 5.2 ratio — ABNORMAL HIGH (ref 0.0–4.4)
Cholesterol, Total: 215 mg/dL — ABNORMAL HIGH (ref 100–199)
HDL: 41 mg/dL (ref >39)
LDL Chol Calc (NIH): 146 mg/dL — ABNORMAL HIGH (ref 0–99)
Triglycerides: 153 mg/dL — ABNORMAL HIGH (ref 0–149)
VLDL Cholesterol Cal: 28 mg/dL (ref 5–40)

## 2021-08-16 LAB — CBC WITH DIFFERENTIAL/PLATELET
Basophils Absolute: 0.1 10*3/uL (ref 0.0–0.2)
Basos: 1 %
EOS (ABSOLUTE): 0.3 10*3/uL (ref 0.0–0.4)
Eos: 5 %
Hematocrit: 38.7 % (ref 34.0–46.6)
Hemoglobin: 12.6 g/dL (ref 11.1–15.9)
Immature Grans (Abs): 0 10*3/uL (ref 0.0–0.1)
Immature Granulocytes: 0 %
Lymphocytes Absolute: 1.7 10*3/uL (ref 0.7–3.1)
Lymphs: 27 %
MCH: 28.3 pg (ref 26.6–33.0)
MCHC: 32.6 g/dL (ref 31.5–35.7)
MCV: 87 fL (ref 79–97)
Monocytes Absolute: 0.4 10*3/uL (ref 0.1–0.9)
Monocytes: 6 %
Neutrophils Absolute: 3.9 10*3/uL (ref 1.4–7.0)
Neutrophils: 61 %
Platelets: 342 10*3/uL (ref 150–450)
RBC: 4.45 x10E6/uL (ref 3.77–5.28)
RDW: 13.4 % (ref 11.7–15.4)
WBC: 6.4 10*3/uL (ref 3.4–10.8)

## 2021-08-16 LAB — HEMOGLOBIN A1C
Est. average glucose Bld gHb Est-mCnc: 114 mg/dL
Hgb A1c MFr Bld: 5.6 % (ref 4.8–5.6)

## 2021-08-17 NOTE — Addendum Note (Signed)
Addended by: Eduard Clos on: 08/17/2021 03:33 PM   Modules accepted: Orders

## 2021-08-19 LAB — URINE CULTURE

## 2021-11-14 ENCOUNTER — Ambulatory Visit: Payer: Self-pay | Admitting: Nurse Practitioner

## 2021-11-17 ENCOUNTER — Ambulatory Visit: Payer: Self-pay | Admitting: Nurse Practitioner

## 2022-01-03 ENCOUNTER — Ambulatory Visit (INDEPENDENT_AMBULATORY_CARE_PROVIDER_SITE_OTHER): Payer: Self-pay | Admitting: Nurse Practitioner

## 2022-01-03 ENCOUNTER — Other Ambulatory Visit: Payer: Self-pay

## 2022-01-03 ENCOUNTER — Encounter: Payer: Self-pay | Admitting: Nurse Practitioner

## 2022-01-03 VITALS — BP 140/80 | HR 77 | Resp 16 | Wt 293.0 lb

## 2022-01-03 DIAGNOSIS — M25559 Pain in unspecified hip: Secondary | ICD-10-CM

## 2022-01-03 DIAGNOSIS — N39498 Other specified urinary incontinence: Secondary | ICD-10-CM

## 2022-01-03 DIAGNOSIS — Z599 Problem related to housing and economic circumstances, unspecified: Secondary | ICD-10-CM

## 2022-01-03 LAB — POCT URINALYSIS DIP (CLINITEK)
Bilirubin, UA: NEGATIVE
Glucose, UA: NEGATIVE mg/dL
Ketones, POC UA: NEGATIVE mg/dL
Leukocytes, UA: NEGATIVE
Nitrite, UA: NEGATIVE
POC PROTEIN,UA: NEGATIVE
Spec Grav, UA: 1.015 (ref 1.010–1.025)
Urobilinogen, UA: 0.2 E.U./dL
pH, UA: 6 (ref 5.0–8.0)

## 2022-01-03 NOTE — Progress Notes (Signed)
[name], [place]  [address] Phone:  [phone]   Fax:  [phone] Subjective:   Patient ID: Elaine [name], female    DOB: [birth year], 41 y.o.   MRN: [id number]  Chief Complaint  Patient presents with   Hip Pain   trouble walking    Elaine [name] 41 y.o. female  has a past medical history of Anorexia, Anxiety, Dysrhythmia, Gall stones, Headache, Hypermobility of joint, Hypertension, IBS (irritable bowel syndrome), Infection, Jaundice, Mastoiditis, Migraine, Morton neuroma, Ovarian cyst, Pancreatitis, PCOS (polycystic ovarian syndrome), Psoriasis, Psoriasis, Scoliosis, and Vitamin D deficiency (08/2020). To the [facility] for bilateral hip pain.   Patient states that she has had progressive pain in the bilateral hips, most pronounced in the right hip. Has been taking tylenol for pain with no improvement in symptoms. Can not tolerate NSAIDS due to history of GI complications. Over the past year, she has had worsening urinary intolerance. She states that she feels an immediate urge to urinate, and at times does not make it to the bathroom. Has had difficulty ambulating and sleeping due to pain. Currently rates pain 6/10 and worsens with ambulation. Describes as throbbing and aching.  When questioned about B/P states that she is compliant with medications at home. Last dose of B/P medication this morning. Denies any other complaints today. Patient questioned potential causes of increased weight, stating, " I keep gaining weight, I only eat once a day."   Denies any fatigue, chest pain, shortness of breath, HA or dizziness. Denies any blurred vision, numbness or tingling.   Past Medical History:  Diagnosis Date   Anorexia    Anxiety    Dysrhythmia    Gall stones    Headache    Hypermobility of joint    Ehler Danlos Syndrome   Hypertension    IBS (irritable bowel syndrome)    Infection    bladder   Jaundice    Mastoiditis    Migraine     Morton neuroma    Ovarian cyst    Pancreatitis    PCOS (polycystic ovarian syndrome)    Psoriasis    Psoriasis    Scoliosis    Vitamin D deficiency 08/2020    Past Surgical History:  Procedure Laterality Date   DILATION AND CURETTAGE OF UTERUS  2002   LAPAROSCOPIC APPENDECTOMY N/A 07/29/2018   Procedure: APPENDECTOMY LAPAROSCOPIC;  Surgeon: Rolm Bookbinder, MD;  Location: [place];  Service: General;  Laterality: N/A;   LAPAROSCOPIC CHOLECYSTECTOMY  2006    Family History  Problem Relation Age of Onset   Thyroid disease Mother    Heart disease Father    Cancer Father        skin   Osteoarthritis Father    Colon cancer Maternal Grandmother     Social History   Socioeconomic History   Marital status: Married    Spouse name: Not on file   Number of children: Not on file   Years of education: Not on file   Highest education level: Not on file  Occupational History   Not on file  Tobacco Use   Smoking status: Former    Packs/day: 1.00    Types: Cigarettes   Smokeless tobacco: Never  Vaping Use   Vaping Use: Never used  Substance and Sexual Activity   Alcohol use: Yes    Alcohol/week: 1.0 standard drink    Types: 1 Cans of beer per week    Comment: weekly  Drug use: No   Sexual activity: Yes    Birth control/protection: Surgical    Comment: vasectomy in spouse  Other Topics Concern   Not on file  Social History Narrative   Not on file   Social Determinants of Health   Financial Resource Strain: Not on file  Food Insecurity: Not on file  Transportation Needs: Not on file  Physical Activity: Not on file  Stress: Not on file  Social Connections: Not on file  Intimate Partner Violence: Not on file    Outpatient Medications Prior to Visit  Medication Sig Dispense Refill   acetaminophen (TYLENOL) 500 MG tablet Take 2 tablets (1,000 mg total) by mouth every 6 (six) hours as needed. 30 tablet 0   amLODipine (NORVASC) 10 MG tablet Take 1 tablet (10 mg total)  by mouth daily. 90 tablet 3   B Complex-C (B-COMPLEX WITH VITAMIN C) tablet Take 1 tablet by mouth daily.     cetirizine (ZYRTEC) 10 MG tablet Take 10 mg by mouth every morning.      chlorpheniramine (CHLOR-TRIMETON) 4 MG tablet Take 4 mg by mouth every evening.     metoprolol tartrate (LOPRESSOR) 50 MG tablet Take 1 tablet (50 mg total) by mouth 2 (two) times daily. 180 tablet 3   olmesartan (BENICAR) 20 MG tablet Take 1 tablet (20 mg total) by mouth daily. 90 tablet 3   omeprazole (PRILOSEC) 20 MG capsule Take 20 mg by mouth daily.      Vitamin D, Ergocalciferol, (DRISDOL) 1.25 MG (50000 UNIT) CAPS capsule Take 1 capsule (50,000 Units total) by mouth every 7 (seven) days. 5 capsule 3   Biotin 1000 MCG tablet Take 1,000 mcg by mouth daily. (Patient not taking: Reported on 08/15/2021)     budesonide (RHINOCORT AQUA) 32 MCG/ACT nasal spray Place 1 spray into both nostrils every evening.  (Patient not taking: Reported on 09/16/2020)     No facility-administered medications prior to visit.    Allergies  Allergen Reactions   Sulfa Antibiotics Nausea Only   Ciprofloxacin     Has higher risk of stroke if taken    Review of Systems  Constitutional:  Negative for chills, fever and malaise/fatigue.  Eyes: Negative.   Respiratory:  Negative for cough and shortness of breath.   Cardiovascular:  Negative for chest pain, palpitations and leg swelling.  Gastrointestinal:  Negative for abdominal pain, blood in stool, constipation, diarrhea, nausea and vomiting.  Genitourinary:  Positive for frequency and urgency. Negative for dysuria, flank pain and hematuria.       See HPI  Musculoskeletal:  Positive for joint pain.  Skin: Negative.   Neurological: Negative.   Psychiatric/Behavioral:  Negative for depression. The patient is not nervous/anxious.   All other systems reviewed and are negative.     Objective:    Physical Exam Vitals reviewed.  Constitutional:      General: She is not in acute  distress.    Appearance: Normal appearance. She is obese.  HENT:     Head: Normocephalic.  Neck:     Vascular: No carotid bruit.  Cardiovascular:     Rate and Rhythm: Normal rate and regular rhythm.     Pulses: Normal pulses.     Heart sounds: Normal heart sounds.     Comments: No obvious peripheral edema Pulmonary:     Effort: Pulmonary effort is normal.     Breath sounds: Normal breath sounds.  Abdominal:     Tenderness: There is no right CVA  tenderness or left CVA tenderness.  Musculoskeletal:        General: Tenderness present. No swelling, deformity or signs of injury. Normal range of motion.     Cervical back: Normal range of motion and neck supple. No rigidity or tenderness.     Right lower leg: No edema.     Left lower leg: No edema.     Comments: Moderate tenderness with palpation of left hip and diffuse lower lumbar   Lymphadenopathy:     Cervical: No cervical adenopathy.  Skin:    General: Skin is warm and dry.     Capillary Refill: Capillary refill takes less than 2 seconds.  Neurological:     General: No focal deficit present.     Mental Status: She is alert and oriented to person, place, and time.  Psychiatric:        Mood and Affect: Mood normal.        Behavior: Behavior normal.        Thought Content: Thought content normal.        Judgment: Judgment normal.    BP 140/80    Pulse 77    Resp 16    Wt 293 lb (132.9 kg)    SpO2 94%    BMI 43.90 kg/m  Wt Readings from Last 3 Encounters:  01/03/22 293 lb (132.9 kg)  08/15/21 292 lb (132.5 kg)  09/16/20 288 lb 3.2 oz (130.7 kg)    Immunization History  Administered Date(s) Administered   Influenza,inj,Quad PF,6+ Mos 08/15/2021   PFIZER(Purple Top)SARS-COV-2 Vaccination 03/03/2020, 03/28/2020, 06/23/2020    Diabetic Foot Exam - Simple   No data filed     Lab Results  Component Value Date   TSH 2.940 08/09/2020   Lab Results  Component Value Date   WBC 6.4 08/15/2021   HGB 12.6 08/15/2021   HCT  38.7 08/15/2021   MCV 87 08/15/2021   PLT 342 08/15/2021   Lab Results  Component Value Date   NA 141 08/15/2021   K 4.3 08/15/2021   CO2 22 08/15/2021   GLUCOSE 115 (H) 08/15/2021   BUN 6 08/15/2021   CREATININE 0.69 08/15/2021   BILITOT 0.3 08/15/2021   ALKPHOS 71 08/15/2021   AST 15 08/15/2021   ALT 8 08/15/2021   PROT 6.9 08/15/2021   ALBUMIN 4.3 08/15/2021   CALCIUM 8.9 08/15/2021   ANIONGAP 11 06/20/2020   EGFR 112 08/15/2021   Lab Results  Component Value Date   CHOL 215 (H) 08/15/2021   CHOL 197 08/09/2020   CHOL  10/14/2007    163        ATP III CLASSIFICATION:  <200     mg/dL   Desirable  566-483  mg/dL   Borderline High  >=032    mg/dL   High   Lab Results  Component Value Date   HDL 41 08/15/2021   HDL 55 08/09/2020   HDL 28 (L) 10/14/2007   Lab Results  Component Value Date   LDLCALC 146 (H) 08/15/2021   LDLCALC 122 (H) 08/09/2020   LDLCALC (H) 10/14/2007    114        Total Cholesterol/HDL:CHD Risk Coronary Heart Disease Risk Table                     Men   Women  1/2 Average Risk   3.4   3.3   Lab Results  Component Value Date   TRIG 153 (H) 08/15/2021  TRIG 113 08/09/2020   TRIG 104 10/14/2007   Lab Results  Component Value Date   CHOLHDL 5.2 (H) 08/15/2021   CHOLHDL 3.6 08/09/2020   CHOLHDL 5.8 10/14/2007   Lab Results  Component Value Date   HGBA1C 5.6 08/15/2021   HGBA1C 5.1 08/09/2020   HGBA1C 5.1 08/09/2020   HGBA1C 5.1 (A) 08/09/2020   HGBA1C 5.1 08/09/2020       Assessment & Plan:   Problem List Items Addressed This Visit   None Visit Diagnoses     Hip pain    -  Primary Discussed OTC options for management of pain Refuses prescriptions at this time due to financial constraints Discussed exercises/ ROM techniques to assist with pain, in addition to weight loss   Other urinary incontinence       Relevant Orders   POCT URINALYSIS DIP (CLINITEK) Currently concerned for infectious process v spinal/ vertebral  cause of symptoms. Discussed concerns with patient, refused MRI and/ referral to specialist. Discussed potential risks of symptoms worsening, patient continues to refuse, citing financial constraints.    Financial difficulties     Patient states that she has attempted several means for receiving assistance with medical costs and has been unsuccessful.    Follow up in 3 mths for reevaluation of symptoms and pap, sooner as needed    I am having Marin Shutter maintain her omeprazole, acetaminophen, cetirizine, budesonide, chlorpheniramine, B-complex with vitamin C, Biotin, Vitamin D (Ergocalciferol), amLODipine, metoprolol tartrate, and olmesartan.  No orders of the defined types were placed in this encounter.    Teena Dunk, NP

## 2022-01-03 NOTE — Progress Notes (Signed)
Patient stated she has pain in hip and trouble walking, at time it hard to sleep and pain level changes. Pt also stated at time it hard for her to hold her bladder.

## 2022-01-03 NOTE — Patient Instructions (Signed)
You were seen today in the [facility] for hip pain and urinary incontinence. Labs were collected, results will be available via MyChart or, if abnormal, you will be contacted by clinic staff. You were prescribed medications, please take as directed. Please follow up in 3 mths for reevaluation of symptoms and pap smear.

## 2022-04-03 ENCOUNTER — Ambulatory Visit: Payer: Self-pay | Admitting: Nurse Practitioner

## 2022-04-10 ENCOUNTER — Ambulatory Visit: Payer: Self-pay | Admitting: Nurse Practitioner

## 2022-05-09 ENCOUNTER — Encounter: Payer: Self-pay | Admitting: Nurse Practitioner

## 2022-05-09 ENCOUNTER — Ambulatory Visit (INDEPENDENT_AMBULATORY_CARE_PROVIDER_SITE_OTHER): Payer: Self-pay | Admitting: Nurse Practitioner

## 2022-05-09 VITALS — BP 123/74 | HR 96 | Temp 98.1°F | Ht 68.5 in | Wt 291.6 lb

## 2022-05-09 DIAGNOSIS — Z01419 Encounter for gynecological examination (general) (routine) without abnormal findings: Secondary | ICD-10-CM

## 2022-05-09 DIAGNOSIS — N920 Excessive and frequent menstruation with regular cycle: Secondary | ICD-10-CM

## 2022-05-09 NOTE — Progress Notes (Unsigned)
[place], [place]  [address] Phone:  [phone]   Fax:  [phone] Subjective:   Patient ID: Elaine [name], female    DOB: 1981/05/28, 41 y.o.   MRN: [id number]  Chief Complaint  Patient presents with   Gynecologic Exam    Pt is here pap smear. Pt stated she has been heaver menstrual cycle and cramp    Gynecologic Exam Pertinent negatives include no abdominal pain, chills, constipation, diarrhea, fever, nausea or vomiting.  Marin Shutter 41 y.o. female  has a past medical history of Anorexia, Anxiety, Dysrhythmia, Gall stones, Headache, Hypermobility of joint, Hypertension, IBS (irritable bowel syndrome), Infection, Jaundice, Mastoiditis, Migraine, Morton neuroma, Ovarian cyst, Pancreatitis, PCOS (polycystic ovarian syndrome), Psoriasis, Psoriasis, Scoliosis, and Vitamin D deficiency (08/2020). To the [facility] for women's wellness exam and pap smear.  Concerned today about heavier than normal bleeding for the past 1-2 yrs and worsening menstrual cramps. Cycle typically lasts 6-7 days and occurs monthly. Denies any history intrauterine abnormalities. Denies any family history of menopause, but states that her mother had a hysterectomy in her early forties. LMP 04/23/22. Denies any other vaginal concerns, discharge or irritation. Denies being on birth control, states that her husband has had a vasectomy. Has had one partner in the past 6 yrs, heterosexual preference. Denies completing SBE at home, states, " I have lumpy breast so checking my breast gives me anxiety."   Denies any other concerns today. Denies any fatigue, chest pain, shortness of breath, HA or dizziness. Denies any blurred vision, numbness or tingling. Past Medical History:  Diagnosis Date   Anorexia    Anxiety    Dysrhythmia    Gall stones    Headache    Hypermobility of joint    Ehler Danlos Syndrome   Hypertension    IBS (irritable bowel syndrome)    Infection    bladder    Jaundice    Mastoiditis    Migraine    Morton neuroma    Ovarian cyst    Pancreatitis    PCOS (polycystic ovarian syndrome)    Psoriasis    Psoriasis    Scoliosis    Vitamin D deficiency 08/2020    Past Surgical History:  Procedure Laterality Date   DILATION AND CURETTAGE OF UTERUS  2002   LAPAROSCOPIC APPENDECTOMY N/A 07/29/2018   Procedure: APPENDECTOMY LAPAROSCOPIC;  Surgeon: Rolm Bookbinder, MD;  Location: [facility];  Service: General;  Laterality: N/A;   LAPAROSCOPIC CHOLECYSTECTOMY  2006    Family History  Problem Relation Age of Onset   Thyroid disease Mother    Heart disease Father    Cancer Father        skin   Osteoarthritis Father    Colon cancer Maternal Grandmother     Social History   Socioeconomic History   Marital status: Married    Spouse name: Not on file   Number of children: Not on file   Years of education: Not on file   Highest education level: Not on file  Occupational History   Not on file  Tobacco Use   Smoking status: Former    Packs/day: 1.00    Types: Cigarettes   Smokeless tobacco: Never  Vaping Use   Vaping Use: Never used  Substance and Sexual Activity   Alcohol use: Yes    Alcohol/week: 1.0 standard drink    Types: 1 Cans of beer per week    Comment: weekly  Drug use: No   Sexual activity: Yes    Birth control/protection: Surgical    Comment: vasectomy in spouse  Other Topics Concern   Not on file  Social History Narrative   Not on file   Social Determinants of Health   Financial Resource Strain: Not on file  Food Insecurity: Not on file  Transportation Needs: Not on file  Physical Activity: Not on file  Stress: Not on file  Social Connections: Not on file  Intimate Partner Violence: Not on file    Outpatient Medications Prior to Visit  Medication Sig Dispense Refill   acetaminophen (TYLENOL) 500 MG tablet Take 2 tablets (1,000 mg total) by mouth every 6 (six) hours as needed. 30 tablet 0   amLODipine  (NORVASC) 10 MG tablet Take 1 tablet (10 mg total) by mouth daily. 90 tablet 3   B Complex-C (B-COMPLEX WITH VITAMIN C) tablet Take 1 tablet by mouth daily.     cetirizine (ZYRTEC) 10 MG tablet Take 10 mg by mouth every morning.      chlorpheniramine (CHLOR-TRIMETON) 4 MG tablet Take 4 mg by mouth every evening.     metoprolol tartrate (LOPRESSOR) 50 MG tablet Take 1 tablet (50 mg total) by mouth 2 (two) times daily. 180 tablet 3   olmesartan (BENICAR) 20 MG tablet Take 1 tablet (20 mg total) by mouth daily. 90 tablet 3   omeprazole (PRILOSEC) 20 MG capsule Take 20 mg by mouth daily.      Vitamin D, Ergocalciferol, (DRISDOL) 1.25 MG (50000 UNIT) CAPS capsule Take 1 capsule (50,000 Units total) by mouth every 7 (seven) days. 5 capsule 3   Biotin 1000 MCG tablet Take 1,000 mcg by mouth daily. (Patient not taking: Reported on 08/15/2021)     budesonide (RHINOCORT AQUA) 32 MCG/ACT nasal spray Place 1 spray into both nostrils every evening.  (Patient not taking: Reported on 09/16/2020)     No facility-administered medications prior to visit.    Allergies  Allergen Reactions   Sulfa Antibiotics Nausea Only   Ciprofloxacin     Has higher risk of stroke if taken    Review of Systems  Constitutional:  Negative for chills, fever and malaise/fatigue.  Respiratory:  Negative for cough and shortness of breath.   Cardiovascular:  Negative for chest pain, palpitations and leg swelling.  Gastrointestinal:  Negative for abdominal pain, blood in stool, constipation, diarrhea, nausea and vomiting.  Genitourinary:        See HPI  Musculoskeletal: Negative.   Skin: Negative.   Neurological: Negative.   Psychiatric/Behavioral:  Negative for depression. The patient is not nervous/anxious.   All other systems reviewed and are negative.     Objective:    Physical Exam Vitals reviewed. Exam conducted with a chaperone present.  Constitutional:      General: She is not in acute distress.    Appearance:  Normal appearance. She is obese.  HENT:     Head: Normocephalic.  Cardiovascular:     Rate and Rhythm: Normal rate and regular rhythm.     Pulses: Normal pulses.     Heart sounds: Normal heart sounds.     Comments: No obvious peripheral edema Pulmonary:     Effort: Pulmonary effort is normal.     Breath sounds: Normal breath sounds.  Chest:  Breasts:    Right: Normal.     Left: Normal.  Genitourinary:    General: Normal vulva.     Labia:  Right: No rash, tenderness, lesion or injury.        Left: No rash, tenderness, lesion or injury.      Vagina: Normal.     Cervix: No cervical motion tenderness.     Uterus: Normal.   Lymphadenopathy:     Upper Body:     Right upper body: No supraclavicular adenopathy.     Left upper body: No supraclavicular adenopathy.  Skin:    General: Skin is warm and dry.     Capillary Refill: Capillary refill takes less than 2 seconds.  Neurological:     Mental Status: She is alert.  Psychiatric:        Mood and Affect: Mood normal.        Behavior: Behavior normal.        Thought Content: Thought content normal.        Judgment: Judgment normal.    BP 123/74 (BP Location: Right Arm, Patient Position: Sitting, Cuff Size: Large)   Pulse 96   Temp 98.1 F (36.7 C)   Ht 5' 8.5" (1.74 m)   Wt 291 lb 9.6 oz (132.3 kg)   LMP 04/24/2022   SpO2 100%   BMI 43.69 kg/m  Wt Readings from Last 3 Encounters:  05/09/22 291 lb 9.6 oz (132.3 kg)  01/03/22 293 lb (132.9 kg)  08/15/21 292 lb (132.5 kg)    Immunization History  Administered Date(s) Administered   Influenza,inj,Quad PF,6+ Mos 08/15/2021   PFIZER(Purple Top)SARS-COV-2 Vaccination 03/03/2020, 03/28/2020, 06/23/2020    Diabetic Foot Exam - Simple   No data filed     Lab Results  Component Value Date   TSH 2.940 08/09/2020   Lab Results  Component Value Date   WBC 6.4 08/15/2021   HGB 12.6 08/15/2021   HCT 38.7 08/15/2021   MCV 87 08/15/2021   PLT 342 08/15/2021   Lab  Results  Component Value Date   NA 141 08/15/2021   K 4.3 08/15/2021   CO2 22 08/15/2021   GLUCOSE 115 (H) 08/15/2021   BUN 6 08/15/2021   CREATININE 0.69 08/15/2021   BILITOT 0.3 08/15/2021   ALKPHOS 71 08/15/2021   AST 15 08/15/2021   ALT 8 08/15/2021   PROT 6.9 08/15/2021   ALBUMIN 4.3 08/15/2021   CALCIUM 8.9 08/15/2021   ANIONGAP 11 06/20/2020   EGFR 112 08/15/2021   Lab Results  Component Value Date   CHOL 215 (H) 08/15/2021   CHOL 197 08/09/2020   CHOL  10/14/2007    163        ATP III CLASSIFICATION:  <200     mg/dL   Desirable  082-760  mg/dL   Borderline High  >=100    mg/dL   High   Lab Results  Component Value Date   HDL 41 08/15/2021   HDL 55 08/09/2020   HDL 28 (L) 10/14/2007   Lab Results  Component Value Date   LDLCALC 146 (H) 08/15/2021   LDLCALC 122 (H) 08/09/2020   LDLCALC (H) 10/14/2007    114        Total Cholesterol/HDL:CHD Risk Coronary Heart Disease Risk Table                     Men   Women  1/2 Average Risk   3.4   3.3   Lab Results  Component Value Date   TRIG 153 (H) 08/15/2021   TRIG 113 08/09/2020   TRIG 104 10/14/2007   Lab Results  Component Value Date   CHOLHDL 5.2 (H) 08/15/2021   CHOLHDL 3.6 08/09/2020   CHOLHDL 5.8 10/14/2007   Lab Results  Component Value Date   HGBA1C 5.6 08/15/2021   HGBA1C 5.1 08/09/2020   HGBA1C 5.1 08/09/2020   HGBA1C 5.1 (A) 08/09/2020   HGBA1C 5.1 08/09/2020       Assessment & Plan:   Problem List Items Addressed This Visit   None Visit Diagnoses     Women's annual routine gynecological examination    -  Primary       I am having Marin Shutter maintain her omeprazole, acetaminophen, cetirizine, budesonide, chlorpheniramine, B-complex with vitamin C, Biotin, Vitamin D (Ergocalciferol), amLODipine, metoprolol tartrate, and olmesartan.  No orders of the defined types were placed in this encounter.    Teena Dunk, NP

## 2022-05-09 NOTE — Patient Instructions (Signed)
You were seen today in the [facility] for pap smear and women's wellness exam. Labs were collected, results will be available via MyChart or, if abnormal, you will be contacted by clinic staff. Please follow up as needed

## 2022-05-12 LAB — NUSWAB VAGINITIS PLUS (VG+)
Candida albicans, NAA: NEGATIVE
Candida glabrata, NAA: NEGATIVE
Chlamydia trachomatis, NAA: NEGATIVE
Neisseria gonorrhoeae, NAA: NEGATIVE
Trich vag by NAA: NEGATIVE

## 2022-05-14 LAB — PAP IG AND CT-NG NAA
Chlamydia, Nuc. Acid Amp: NEGATIVE
Gonococcus by Nucleic Acid Amp: NEGATIVE

## 2022-07-31 ENCOUNTER — Other Ambulatory Visit: Payer: Self-pay | Admitting: Nurse Practitioner

## 2022-07-31 DIAGNOSIS — I10 Essential (primary) hypertension: Secondary | ICD-10-CM

## 2022-08-03 ENCOUNTER — Other Ambulatory Visit: Payer: Self-pay | Admitting: Nurse Practitioner

## 2022-08-03 DIAGNOSIS — I10 Essential (primary) hypertension: Secondary | ICD-10-CM

## 2023-07-25 ENCOUNTER — Telehealth: Payer: Self-pay | Admitting: Nurse Practitioner

## 2023-07-25 NOTE — Telephone Encounter (Signed)
Pt LVM requesting BP med refills. Has not been seen in over a year. Called pt back and LVM to schedule an appt.

## 2023-07-26 ENCOUNTER — Other Ambulatory Visit: Payer: Self-pay | Admitting: Nurse Practitioner

## 2023-07-26 DIAGNOSIS — I10 Essential (primary) hypertension: Secondary | ICD-10-CM

## 2023-07-26 MED ORDER — AMLODIPINE BESYLATE 10 MG PO TABS: 10.0000 mg | ORAL_TABLET | Freq: Every day | ORAL | 0 refills | Status: DC

## 2023-07-26 MED ORDER — METOPROLOL TARTRATE 50 MG PO TABS
50.0000 mg | ORAL_TABLET | Freq: Two times a day (BID) | ORAL | 0 refills | Status: DC
Start: 2023-07-26 — End: 2023-08-20

## 2023-07-26 MED ORDER — OLMESARTAN MEDOXOMIL 20 MG PO TABS: 20.0000 mg | ORAL_TABLET | Freq: Every day | ORAL | 0 refills | Status: DC

## 2023-08-06 ENCOUNTER — Ambulatory Visit: Payer: Self-pay | Admitting: Nurse Practitioner

## 2023-08-14 ENCOUNTER — Ambulatory Visit: Payer: Self-pay | Admitting: Nurse Practitioner

## 2023-08-19 ENCOUNTER — Ambulatory Visit: Payer: Self-pay | Admitting: Nurse Practitioner

## 2023-08-20 ENCOUNTER — Ambulatory Visit (INDEPENDENT_AMBULATORY_CARE_PROVIDER_SITE_OTHER): Payer: Self-pay | Admitting: Nurse Practitioner

## 2023-08-20 ENCOUNTER — Encounter: Payer: Self-pay | Admitting: Nurse Practitioner

## 2023-08-20 VITALS — BP 135/75 | HR 81 | Temp 99.1°F | Resp 12 | Ht 69.0 in

## 2023-08-20 DIAGNOSIS — M25552 Pain in left hip: Secondary | ICD-10-CM

## 2023-08-20 DIAGNOSIS — R102 Pelvic and perineal pain: Secondary | ICD-10-CM

## 2023-08-20 DIAGNOSIS — Z1321 Encounter for screening for nutritional disorder: Secondary | ICD-10-CM

## 2023-08-20 DIAGNOSIS — R079 Chest pain, unspecified: Secondary | ICD-10-CM

## 2023-08-20 DIAGNOSIS — E559 Vitamin D deficiency, unspecified: Secondary | ICD-10-CM

## 2023-08-20 DIAGNOSIS — Z130 Encounter for screening for diseases of the blood and blood-forming organs and certain disorders involving the immune mechanism: Secondary | ICD-10-CM

## 2023-08-20 DIAGNOSIS — Z13228 Encounter for screening for other metabolic disorders: Secondary | ICD-10-CM

## 2023-08-20 DIAGNOSIS — Z1329 Encounter for screening for other suspected endocrine disorder: Secondary | ICD-10-CM

## 2023-08-20 DIAGNOSIS — M25551 Pain in right hip: Secondary | ICD-10-CM

## 2023-08-20 DIAGNOSIS — R0602 Shortness of breath: Secondary | ICD-10-CM

## 2023-08-20 DIAGNOSIS — I10 Essential (primary) hypertension: Secondary | ICD-10-CM

## 2023-08-20 LAB — POCT URINALYSIS DIP (CLINITEK)
Bilirubin, UA: NEGATIVE
Glucose, UA: NEGATIVE mg/dL
Ketones, POC UA: NEGATIVE mg/dL
Leukocytes, UA: NEGATIVE
Nitrite, UA: NEGATIVE
POC PROTEIN,UA: NEGATIVE
Spec Grav, UA: 1.015 (ref 1.010–1.025)
Urobilinogen, UA: 0.2 U/dL
pH, UA: 6 (ref 5.0–8.0)

## 2023-08-20 MED ORDER — AMLODIPINE BESYLATE 10 MG PO TABS: 10.0000 mg | ORAL_TABLET | Freq: Every day | ORAL | 1 refills | Status: DC

## 2023-08-20 MED ORDER — METOPROLOL TARTRATE 50 MG PO TABS: 50.0000 mg | ORAL_TABLET | Freq: Two times a day (BID) | ORAL | 1 refills | Status: DC

## 2023-08-20 MED ORDER — OLMESARTAN MEDOXOMIL 20 MG PO TABS: 20.0000 mg | ORAL_TABLET | Freq: Every day | ORAL | 1 refills | Status: DC

## 2023-08-20 NOTE — Assessment & Plan Note (Addendum)
UA negative for UTI Pelvic pain Now resolved Pain Could be due to her known ovarian cyst Educational material on ovarian cyst and management  provided We discussed getting a pelvic ultrasound but the patient declined

## 2023-08-20 NOTE — Assessment & Plan Note (Signed)
This could be due to her obesity  Patient encouraged to lose weight Encouraged to exercise as tolerated

## 2023-08-20 NOTE — Assessment & Plan Note (Addendum)
Chest pain on and off Blood pressure currently well-controlled Previous EKG reviewed showed normal sinus rhythm Patient encouraged to monitor blood pressure at home If pain symptoms persist will refer patient to cardiology Checking CBC CMP

## 2023-08-20 NOTE — Assessment & Plan Note (Signed)
I discussed getting an x-ray of the hip but the patient declined Encouraged to take OTC Tylenol as needed Application of heat or ice also encouraged Need for regular daily exercises as tolerated discussed

## 2023-08-20 NOTE — Assessment & Plan Note (Addendum)
Wt Readings from Last 3 Encounters:  05/09/22 291 lb 9.6 oz (132.3 kg)  01/03/22 293 lb (132.9 kg)  08/15/21 292 lb (132.5 kg)   Body mass index is 43.06 kg/m.  Patient counseled on low-carb modified diet Encouraged to engage in regular moderate exercises as tolerated

## 2023-08-20 NOTE — Progress Notes (Signed)
Pt is here for pelvic pain   Complaining of pelvic pain, pressure , worse on the right side   Complaining of fever for X3 days

## 2023-08-20 NOTE — Patient Instructions (Addendum)
1. Bilateral hip pain   2. Primary hypertension   3. Pelvic pain  - POCT URINALYSIS DIP (CLINITEK)  4. Essential hypertension  - amLODipine (NORVASC) 10 MG tablet; Take 1 tablet (10 mg total) by mouth daily.  Dispense: 90 tablet; Refill: 1 - olmesartan (BENICAR) 20 MG tablet; Take 1 tablet (20 mg total) by mouth daily.  Dispense: 90 tablet; Refill: 1 - metoprolol tartrate (LOPRESSOR) 50 MG tablet; Take 1 tablet (50 mg total) by mouth 2 (two) times daily.  Dispense: 180 tablet; Refill: 1      It is important that you exercise regularly at least 30 minutes 5 times a week as tolerated  Think about what you will eat, plan ahead. Choose " clean, green, fresh or frozen" over canned, processed or packaged foods which are more sugary, salty and fatty. 70 to 75% of food eaten should be vegetables and fruit. Three meals at set times with snacks allowed between meals, but they must be fruit or vegetables. Aim to eat over a 12 hour period , example 7 am to 7 pm, and STOP after  your last meal of the day. Drink water,generally about 64 ounces per day, no other drink is as healthy. Fruit juice is best enjoyed in a healthy way, by EATING the fruit.  Thanks for choosing [facility] we consider it a privelige to serve you.

## 2023-08-20 NOTE — Assessment & Plan Note (Signed)
Takes OTC vitamin D We will check vitamin D levels

## 2023-08-20 NOTE — Progress Notes (Signed)
New Patient Office Visit  Subjective:  Patient ID: Elaine [name], adult    DOB: 09/22/1981  Age: 42 y.o. MRN: [id number]  CC:  Chief Complaint  Patient presents with   Pelvic Pain    HPI Elaine [name] is a 42 y.o. adult  has a past medical history of Anorexia, Anxiety, Dysrhythmia, Gall stones, Headache, Hypermobility of joint, Hypertension, IBS (irritable bowel syndrome), Infection, Jaundice, Mastoiditis, Migraine, Morton neuroma, Ovarian cyst, Pancreatitis, PCOS (polycystic ovarian syndrome), Psoriasis, Psoriasis, Scoliosis, and Vitamin D deficiency (08/2020). who presents for establishing care. She was last seen at this office in 2023, previous PCP Orion Crook NP .  Hypertension. Currently on amlodipine 10mg  daily, olmesartan 20mg  daily, metoprolol 50 mg BID. She reports intermittent Chest pain,,SOB,  has edema on the right leg., no dizziness   Patient complains of bilateral hip pain.  She also stated that about 1 to 2 weeks ago on the 23rd-day of her monthly cycle she had pain in her pelvic area, low grade fever, had a little nausea. All those symptoms have revolved, states that she has a known right ovarian cyst. .  She continues to have right hip pain that comes randomly and more with walking.  Currently no fever, chills, dysuria, urinary frequency.  She takes Tylenol as needed for her pain  Patient encouraged to get her Tdap vaccine and flu vaccine at the pharmacy      Past Medical History:  Diagnosis Date   Anorexia    Anxiety    Dysrhythmia    Gall stones    Headache    Hypermobility of joint    Ehler Danlos Syndrome   Hypertension    IBS (irritable bowel syndrome)    Infection    bladder   Jaundice    Mastoiditis    Migraine    Morton neuroma    Ovarian cyst    Pancreatitis    PCOS (polycystic ovarian syndrome)    Psoriasis    Psoriasis    Scoliosis    Vitamin D deficiency 08/2020    Past Surgical History:  Procedure Laterality Date   DILATION AND  CURETTAGE OF UTERUS  2002   LAPAROSCOPIC APPENDECTOMY N/A 07/29/2018   Procedure: APPENDECTOMY LAPAROSCOPIC;  Surgeon: Emelia Loron, MD;  Location: [facility];  Service: General;  Laterality: N/A;   LAPAROSCOPIC CHOLECYSTECTOMY  2006    Family History  Problem Relation Age of Onset   Thyroid disease Mother    Heart disease Father    Cancer Father        skin   Osteoarthritis Father    Colon cancer Maternal Grandmother     Social History   Socioeconomic History   Marital status: Married    Spouse name: Not on file   Number of children: Not on file   Years of education: Not on file   Highest education level: GED or equivalent  Occupational History   Not on file  Tobacco Use   Smoking status: Former    Current packs/day: 1.00    Types: Cigarettes   Smokeless tobacco: Never  Vaping Use   Vaping status: Never Used  Substance and Sexual Activity   Alcohol use: Yes    Alcohol/week: 1.0 standard drink of alcohol    Types: 1 Cans of beer per week    Comment: occasionally   Drug use: No   Sexual activity: Yes    Birth control/protection: Surgical    Comment: vasectomy in spouse  Other Topics Concern  Not on file  Social History Narrative   Lives with her spouse    Social Determinants of Health   Financial Resource Strain: High Risk (08/19/2023)   Overall Financial Resource Strain (CARDIA)    Difficulty of Paying Living Expenses: Very hard  Food Insecurity: Food Insecurity Present (08/19/2023)   Hunger Vital Sign    Worried About Running Out of Food in the Last Year: Sometimes true    Ran Out of Food in the Last Year: Never true  Transportation Needs: Unmet Transportation Needs (08/19/2023)   PRAPARE - Administrator, Civil Service (Medical): Yes    Lack of Transportation (Non-Medical): Yes  Physical Activity: Unknown (08/19/2023)   Exercise Vital Sign    Days of Exercise per Week: 0 days    Minutes of Exercise per Session: Not on file  Stress: Stress Concern  Present (08/19/2023)   [employer] of Occupational Health - Occupational Stress Questionnaire    Feeling of Stress : Very much  Social Connections: Socially Isolated (08/19/2023)   Social Connection and Isolation Panel [NHANES]    Frequency of Communication with Friends and Family: Once a week    Frequency of Social Gatherings with Friends and Family: Once a week    Attends Religious Services: Never    Database administrator or Organizations: No    Attends Engineer, structural: Not on file    Marital Status: Married  Catering manager Violence: Not on file    ROS Review of Systems  Constitutional:  Positive for fatigue. Negative for appetite change, chills and unexpected weight change.  Respiratory:  Negative for shortness of breath and wheezing.   Cardiovascular:  Positive for chest pain, palpitations and leg swelling.       Off and  on chest pain  Gastrointestinal:  Negative for abdominal distention, abdominal pain, anal bleeding and blood in stool.  Genitourinary:  Positive for pelvic pain. Negative for difficulty urinating, dyspareunia, dysuria and enuresis.  Musculoskeletal:  Positive for arthralgias.  Skin: Negative.   Neurological:  Negative for tremors, seizures and light-headedness.  Psychiatric/Behavioral:  Negative for agitation, behavioral problems, confusion, dysphoric mood and suicidal ideas.     Objective:   Today's Vitals: BP 135/75 (BP Location: Right Arm, Patient Position: Sitting, Cuff Size: Large)   Pulse 81   Temp 99.1 F (37.3 C)   Resp 12   Ht 5\' 9"  (1.753 m)   SpO2 99%   BMI 43.06 kg/m   Physical Exam Vitals and nursing note reviewed.  Constitutional:      General: Elaine [name] is not in acute distress.    Appearance: Normal appearance. Elaine [name] is obese. Elaine [name] is not ill-appearing, toxic-appearing or diaphoretic.  Eyes:     General: No scleral icterus.       Right eye: No discharge.        Left eye: No discharge.     Extraocular Movements:  Extraocular movements intact.     Conjunctiva/sclera: Conjunctivae normal.  Cardiovascular:     Rate and Rhythm: Normal rate and regular rhythm.     Pulses: Normal pulses.     Heart sounds: Normal heart sounds. No murmur heard.    No friction rub. No gallop.  Pulmonary:     Effort: Pulmonary effort is normal. No respiratory distress.     Breath sounds: Normal breath sounds. No stridor. No wheezing, rhonchi or rales.  Chest:     Chest wall: No tenderness.  Abdominal:     General: There  is no distension.     Palpations: Abdomen is soft.     Tenderness: There is no abdominal tenderness. There is no right CVA tenderness, left CVA tenderness or guarding.  Musculoskeletal:        General: No swelling, tenderness, deformity or signs of injury.     Right lower leg: Edema present.     Left lower leg: Edema present.     Comments: Non pitting edema  Skin:    General: Skin is warm and dry.     Capillary Refill: Capillary refill takes less than 2 seconds.     Coloration: Skin is not jaundiced or pale.     Findings: No bruising, erythema or lesion.  Neurological:     Mental Status: Elaine [name] is alert and oriented to person, place, and time.     Motor: No weakness.     Coordination: Coordination normal.     Gait: Gait normal.  Psychiatric:        Mood and Affect: Mood normal.        Behavior: Behavior normal.        Thought Content: Thought content normal.        Judgment: Judgment normal.     Assessment & Plan:   Problem List Items Addressed This Visit       Cardiovascular and Mediastinum   High blood pressure    BP Readings from Last 3 Encounters:  08/20/23 135/75  05/09/22 123/74  01/03/22 140/80   HTN Controlled .  On amlodipine 10 mg daily, metoprolol 50 mg twice daily, olmesartan 20 mg daily Medications refilled Continue current medications. No changes in management. Discussed DASH diet and dietary sodium restrictions Encouraged  to increase dietary efforts and exercise.   Checking CMP        Relevant Medications   amLODipine (NORVASC) 10 MG tablet   olmesartan (BENICAR) 20 MG tablet   metoprolol tartrate (LOPRESSOR) 50 MG tablet     Other   Bilateral hip pain - Primary    I discussed getting an x-ray of the hip but the patient declined Encouraged to take OTC Tylenol as needed Application of heat or ice also encouraged Need for regular daily exercises as tolerated discussed      Pelvic pain    UA negative for UTI Pelvic pain Now resolved Pain Could be due to her known ovarian cyst Educational material on ovarian cyst and management  provided We discussed getting a pelvic ultrasound but the patient declined      Relevant Orders   POCT URINALYSIS DIP (CLINITEK) (Completed)   Morbid obesity (HCC)    Wt Readings from Last 3 Encounters:  05/09/22 291 lb 9.6 oz (132.3 kg)  01/03/22 293 lb (132.9 kg)  08/15/21 292 lb (132.5 kg)   Body mass index is 43.06 kg/m.  Patient counseled on low-carb modified diet Encouraged to engage in regular moderate exercises as tolerated       SOB (shortness of breath)    This could be due to her obesity  Patient encouraged to lose weight Encouraged to exercise as tolerated       Vitamin D deficiency    Takes OTC vitamin D We will check vitamin D levels      Relevant Orders   VITAMIN D 25 Hydroxy (Vit-D Deficiency, Fractures)   Chest pain    Chest pain on and off Blood pressure currently well-controlled Previous EKG reviewed showed normal sinus rhythm Patient encouraged to monitor blood pressure at home If  pain symptoms persist will refer patient to cardiology Checking CBC CMP      Other Visit Diagnoses     Screening for endocrine, nutritional, metabolic and immunity disorder       Relevant Orders   CBC with Differential/Platelet   CMP14+EGFR   Lipid panel   TSH   Hemoglobin A1c       Outpatient Encounter Medications as of 08/20/2023  Medication Sig   acetaminophen (TYLENOL) 500 MG  tablet Take 2 tablets (1,000 mg total) by mouth every 6 (six) hours as needed.   B Complex-C (B-COMPLEX WITH VITAMIN C) tablet Take 1 tablet by mouth daily.   cetirizine (ZYRTEC) 10 MG tablet Take 10 mg by mouth every morning.    chlorpheniramine (CHLOR-TRIMETON) 4 MG tablet Take 4 mg by mouth every evening.   omeprazole (PRILOSEC) 20 MG capsule Take 20 mg by mouth daily.    [DISCONTINUED] amLODipine (NORVASC) 10 MG tablet Take 1 tablet (10 mg total) by mouth daily.   [DISCONTINUED] metoprolol tartrate (LOPRESSOR) 50 MG tablet Take 1 tablet (50 mg total) by mouth 2 (two) times daily.   [DISCONTINUED] olmesartan (BENICAR) 20 MG tablet Take 1 tablet (20 mg total) by mouth daily.   amLODipine (NORVASC) 10 MG tablet Take 1 tablet (10 mg total) by mouth daily.   Biotin 1000 MCG tablet Take 1,000 mcg by mouth daily. (Patient not taking: Reported on 08/15/2021)   budesonide (RHINOCORT AQUA) 32 MCG/ACT nasal spray Place 1 spray into both nostrils every evening.  (Patient not taking: Reported on 09/16/2020)   metoprolol tartrate (LOPRESSOR) 50 MG tablet Take 1 tablet (50 mg total) by mouth 2 (two) times daily.   olmesartan (BENICAR) 20 MG tablet Take 1 tablet (20 mg total) by mouth daily.   [DISCONTINUED] Vitamin D, Ergocalciferol, (DRISDOL) 1.25 MG (50000 UNIT) CAPS capsule Take 1 capsule (50,000 Units total) by mouth every 7 (seven) days. (Patient not taking: Reported on 08/20/2023)   No facility-administered encounter medications on file as of 08/20/2023.    Follow-up: Return in about 6 months (around 02/17/2024) for HTN, FASTING LABS THIS WEEK.   Donell Beers, FNP

## 2023-08-20 NOTE — Assessment & Plan Note (Signed)
BP Readings from Last 3 Encounters:  08/20/23 135/75  05/09/22 123/74  01/03/22 140/80   HTN Controlled .  On amlodipine 10 mg daily, metoprolol 50 mg twice daily, olmesartan 20 mg daily Medications refilled Continue current medications. No changes in management. Discussed DASH diet and dietary sodium restrictions Encouraged  to increase dietary efforts and exercise.  Checking CMP

## 2023-10-16 ENCOUNTER — Ambulatory Visit: Payer: Self-pay | Admitting: Nurse Practitioner

## 2023-10-18 ENCOUNTER — Other Ambulatory Visit: Payer: Self-pay | Admitting: Medical Genetics

## 2023-10-18 DIAGNOSIS — Z006 Encounter for examination for normal comparison and control in clinical research program: Secondary | ICD-10-CM

## 2024-04-26 ENCOUNTER — Other Ambulatory Visit: Payer: Self-pay | Admitting: Nurse Practitioner

## 2024-04-26 DIAGNOSIS — I10 Essential (primary) hypertension: Secondary | ICD-10-CM

## 2024-05-18 ENCOUNTER — Ambulatory Visit (INDEPENDENT_AMBULATORY_CARE_PROVIDER_SITE_OTHER): Payer: Self-pay | Admitting: Nurse Practitioner

## 2024-05-18 VITALS — BP 102/73 | HR 81 | Temp 97.5°F | Wt 319.0 lb

## 2024-05-18 DIAGNOSIS — I10 Essential (primary) hypertension: Secondary | ICD-10-CM

## 2024-05-18 DIAGNOSIS — E559 Vitamin D deficiency, unspecified: Secondary | ICD-10-CM

## 2024-05-18 DIAGNOSIS — Z13228 Encounter for screening for other metabolic disorders: Secondary | ICD-10-CM

## 2024-05-18 DIAGNOSIS — Z1329 Encounter for screening for other suspected endocrine disorder: Secondary | ICD-10-CM

## 2024-05-18 DIAGNOSIS — Z1321 Encounter for screening for nutritional disorder: Secondary | ICD-10-CM

## 2024-05-18 DIAGNOSIS — M25551 Pain in right hip: Secondary | ICD-10-CM

## 2024-05-18 DIAGNOSIS — M25552 Pain in left hip: Secondary | ICD-10-CM

## 2024-05-18 DIAGNOSIS — Z130 Encounter for screening for diseases of the blood and blood-forming organs and certain disorders involving the immune mechanism: Secondary | ICD-10-CM

## 2024-05-18 DIAGNOSIS — Z5971 Insufficient health insurance coverage: Secondary | ICD-10-CM

## 2024-05-18 MED ORDER — WEGOVY 0.25 MG/0.5ML ~~LOC~~ SOAJ: 0.2500 mg | SUBCUTANEOUS | 0 refills | Status: AC

## 2024-05-18 NOTE — Assessment & Plan Note (Addendum)
 Chronic condition Not interested in getting an x-ray, declined othopedic refers  Encouraged to take OTC Tylenol  as needed Application of heat or ice stretching exercises encouraged

## 2024-05-18 NOTE — Assessment & Plan Note (Signed)
 Controlled on amlodipine  10 mg daily, metoprolol  50 mg twice daily olmesartan  20 mg daily Continue current medications Checking CMP DASH diet and commitment to daily physical activity for a minimum of 30 minutes discussed and encouraged, as a part of hypertension management. The importance of attaining a healthy weight is also discussed.     05/18/2024    2:32 PM 08/20/2023    3:51 PM 05/09/2022    3:33 PM 01/03/2022    2:10 PM 08/15/2021    3:13 PM 09/16/2020    1:22 PM 08/18/2020    1:50 PM  BP/Weight  Systolic BP 102 135 123 140 122 107 154  Diastolic BP 73 75 74 80 86 86 161  Wt. (Lbs) 319  291.6 293 292 288.2   BMI 47.11 kg/m2  43.69 kg/m2 43.9 kg/m2 43.75 kg/m2 42.56 kg/m2

## 2024-05-18 NOTE — Progress Notes (Addendum)
 Established Patient Office Visit  Subjective:  Patient ID: Elaine [name], adult    DOB: 1981-12-10  Age: 43 y.o. MRN: [id number]  CC:  Chief Complaint  Patient presents with   Medical Management of Chronic Issues    HPI Elaine [name] is a 43 y.o. adult  has a past medical history of Anorexia, Anxiety, Dysrhythmia, Gall stones, Headache, Hypermobility of joint, Hypertension, IBS (irritable bowel syndrome), Infection, Jaundice, Mastoiditis, Migraine, Morton neuroma, Ovarian cyst, Pancreatitis, PCOS (polycystic ovarian syndrome), Psoriasis, Psoriasis, Scoliosis, and Vitamin D  deficiency (08/2020).   Patient presents for follow-up for her  chronic medical conditions.  She is accompanied by her spouse She denies any adverse reactions to current medications Please see assessment and plan section for full HPI       Past Medical History:  Diagnosis Date   Anorexia    Anxiety    Dysrhythmia    Gall stones    Headache    Hypermobility of joint    Ehler Danlos Syndrome   Hypertension    IBS (irritable bowel syndrome)    Infection    bladder   Jaundice    Mastoiditis    Migraine    Morton neuroma    Ovarian cyst    Pancreatitis    PCOS (polycystic ovarian syndrome)    Psoriasis    Psoriasis    Scoliosis    Vitamin D  deficiency 08/2020    Past Surgical History:  Procedure Laterality Date   DILATION AND CURETTAGE OF UTERUS  2002   LAPAROSCOPIC APPENDECTOMY N/A 07/29/2018   Procedure: APPENDECTOMY LAPAROSCOPIC;  Surgeon: Enid Harry, MD;  Location: [facility] OR;  Service: General;  Laterality: N/A;   LAPAROSCOPIC CHOLECYSTECTOMY  2006    Family History  Problem Relation Age of Onset   Thyroid  disease Mother    Heart disease Father    Cancer Father        skin   Osteoarthritis Father    Colon cancer Maternal Grandmother     Social History   Socioeconomic History   Marital status: Married    Spouse name: Not on file   Number of children: Not on file   Years of  education: Not on file   Highest education level: GED or equivalent  Occupational History   Not on file  Tobacco Use   Smoking status: Former    Current packs/day: 1.00    Types: Cigarettes   Smokeless tobacco: Never  Vaping Use   Vaping status: Never Used  Substance and Sexual Activity   Alcohol use: Yes    Alcohol/week: 1.0 standard drink of alcohol    Types: 1 Cans of beer per week    Comment: occasionally   Drug use: No   Sexual activity: Yes    Birth control/protection: Surgical    Comment: vasectomy in spouse  Other Topics Concern   Not on file  Social History Narrative   Lives with her spouse    Social Drivers of Health   Financial Resource Strain: High Risk (05/18/2024)   Overall Financial Resource Strain (CARDIA)    Difficulty of Paying Living Expenses: Very hard  Food Insecurity: Food Insecurity Present (05/18/2024)   Hunger Vital Sign    Worried About Running Out of Food in the Last Year: Often true    Ran Out of Food in the Last Year: Never true  Transportation Needs: Unmet Transportation Needs (05/18/2024)   PRAPARE - Administrator, Civil Service (Medical): Yes  Lack of Transportation (Non-Medical): Yes  Physical Activity: Unknown (05/18/2024)   Exercise Vital Sign    Days of Exercise per Week: Patient declined    Minutes of Exercise per Session: Not on file  Stress: Stress Concern Present (05/18/2024)   [employer] of Occupational Health - Occupational Stress Questionnaire    Feeling of Stress : Very much  Social Connections: Socially Isolated (05/18/2024)   Social Connection and Isolation Panel [NHANES]    Frequency of Communication with Friends and Family: Never    Frequency of Social Gatherings with Friends and Family: Never    Attends Religious Services: Never    Database administrator or Organizations: No    Attends Engineer, structural: Not on file    Marital Status: Married  Catering manager Violence: Not on file     Outpatient Medications Prior to Visit  Medication Sig Dispense Refill   acetaminophen  (TYLENOL ) 500 MG tablet Take 2 tablets (1,000 mg total) by mouth every 6 (six) hours as needed. 30 tablet 0   amLODipine  (NORVASC ) 10 MG tablet TAKE 1 TABLET BY MOUTH DAILY 90 tablet 0   cetirizine (ZYRTEC) 10 MG tablet Take 10 mg by mouth every morning.      chlorpheniramine (CHLOR-TRIMETON) 4 MG tablet Take 4 mg by mouth every evening.     metoprolol  tartrate (LOPRESSOR ) 50 MG tablet TAKE 1 TABLET BY MOUTH 2 TIMES A DAY 180 tablet 0   olmesartan  (BENICAR ) 20 MG tablet TAKE 1 TABLET BY MOUTH DAILY 90 tablet 0   omeprazole (PRILOSEC) 20 MG capsule Take 20 mg by mouth daily.      B Complex-C (B-COMPLEX WITH VITAMIN C) tablet Take 1 tablet by mouth daily. (Patient not taking: Reported on 05/18/2024)     Biotin 1000 MCG tablet Take 1,000 mcg by mouth daily. (Patient not taking: Reported on 08/15/2021)     budesonide (RHINOCORT AQUA) 32 MCG/ACT nasal spray Place 1 spray into both nostrils every evening.  (Patient not taking: Reported on 09/16/2020)     No facility-administered medications prior to visit.    Allergies  Allergen Reactions   Sulfa Antibiotics Nausea Only   Ciprofloxacin      Has higher risk of stroke if taken    ROS Review of Systems  Constitutional:  Negative for activity change, appetite change, chills, fatigue and fever.  HENT:  Negative for congestion, dental problem, ear discharge, ear pain, hearing loss and rhinorrhea.   Eyes:  Negative for discharge and itching.  Respiratory:  Negative for cough, chest tightness, shortness of breath and wheezing.   Cardiovascular:  Negative for chest pain, palpitations and leg swelling.  Gastrointestinal:  Negative for abdominal distention, abdominal pain, anal bleeding and blood in stool.  Endocrine: Negative for polydipsia, polyphagia and polyuria.  Genitourinary:  Negative for difficulty urinating, dysuria, flank pain, frequency, hematuria,  menstrual problem, pelvic pain and vaginal bleeding.  Musculoskeletal:  Positive for arthralgias. Negative for joint swelling and neck pain.  Skin:  Negative for color change and pallor.  Allergic/Immunologic: Negative for immunocompromised state.  Neurological:  Negative for dizziness, tremors, facial asymmetry, weakness and headaches.  Hematological:  Negative for adenopathy. Does not bruise/bleed easily.  Psychiatric/Behavioral:  Negative for agitation, behavioral problems, hallucinations, self-injury and suicidal ideas.       Objective:     Physical Exam Vitals and nursing note reviewed.  Constitutional:      General: Roxanna is not in acute distress.    Appearance: Normal appearance. Majorie is obese.  Kealy is not ill-appearing, toxic-appearing or diaphoretic.  Eyes:     General: No scleral icterus.       Right eye: No discharge.        Left eye: No discharge.     Extraocular Movements: Extraocular movements intact.     Conjunctiva/sclera: Conjunctivae normal.  Cardiovascular:     Rate and Rhythm: Normal rate and regular rhythm.     Pulses: Normal pulses.     Heart sounds: Normal heart sounds. No murmur heard.    No friction rub. No gallop.  Pulmonary:     Effort: Pulmonary effort is normal. No respiratory distress.     Breath sounds: Normal breath sounds. No stridor. No wheezing, rhonchi or rales.  Chest:     Chest wall: No tenderness.  Abdominal:     General: There is no distension.     Palpations: Abdomen is soft.     Tenderness: There is no abdominal tenderness. There is no right CVA tenderness, left CVA tenderness or guarding.  Musculoskeletal:        General: No deformity or signs of injury.     Right lower leg: No edema.     Left lower leg: No edema.  Skin:    General: Skin is warm and dry.     Capillary Refill: Capillary refill takes less than 2 seconds.     Coloration: Skin is not jaundiced or pale.     Findings: No bruising, erythema or lesion.  Neurological:      Mental Status: Dejai is alert and oriented to person, place, and time.     Motor: No weakness.     Coordination: Coordination normal.     Gait: Gait normal.  Psychiatric:        Mood and Affect: Mood normal.        Behavior: Behavior normal.        Thought Content: Thought content normal.        Judgment: Judgment normal.     BP 102/73   Pulse 81   Temp (!) 97.5 F (36.4 C)   Wt (!) 319 lb (144.7 kg)   SpO2 96%   BMI 47.11 kg/m  Wt Readings from Last 3 Encounters:  05/18/24 (!) 319 lb (144.7 kg)  05/09/22 291 lb 9.6 oz (132.3 kg)  01/03/22 293 lb (132.9 kg)    Lab Results  Component Value Date   TSH 2.940 08/09/2020   Lab Results  Component Value Date   WBC 6.4 08/15/2021   HGB 12.6 08/15/2021   HCT 38.7 08/15/2021   MCV 87 08/15/2021   PLT 342 08/15/2021   Lab Results  Component Value Date   NA 141 08/15/2021   K 4.3 08/15/2021   CO2 22 08/15/2021   GLUCOSE 115 (H) 08/15/2021   BUN 6 08/15/2021   CREATININE 0.69 08/15/2021   BILITOT 0.3 08/15/2021   ALKPHOS 71 08/15/2021   AST 15 08/15/2021   ALT 8 08/15/2021   PROT 6.9 08/15/2021   ALBUMIN 4.3 08/15/2021   CALCIUM 8.9 08/15/2021   ANIONGAP 11 06/20/2020   EGFR 112 08/15/2021   Lab Results  Component Value Date   CHOL 215 (H) 08/15/2021   Lab Results  Component Value Date   HDL 41 08/15/2021   Lab Results  Component Value Date   LDLCALC 146 (H) 08/15/2021   Lab Results  Component Value Date   TRIG 153 (H) 08/15/2021   Lab Results  Component Value Date   CHOLHDL  5.2 (H) 08/15/2021   Lab Results  Component Value Date   HGBA1C 5.6 08/15/2021      Assessment & Plan:   Problem List Items Addressed This Visit       Cardiovascular and Mediastinum   High blood pressure   Controlled on amlodipine  10 mg daily, metoprolol  50 mg twice daily olmesartan  20 mg daily Continue current medications Checking CMP DASH diet and commitment to daily physical activity for a minimum of 30  minutes discussed and encouraged, as a part of hypertension management. The importance of attaining a healthy weight is also discussed.     05/18/2024    2:32 PM 08/20/2023    3:51 PM 05/09/2022    3:33 PM 01/03/2022    2:10 PM 08/15/2021    3:13 PM 09/16/2020    1:22 PM 08/18/2020    1:50 PM  BP/Weight  Systolic BP 102 135 123 140 122 107 154  Diastolic BP 73 75 74 80 86 86 616  Wt. (Lbs) 319  291.6 293 292 288.2   BMI 47.11 kg/m2  43.69 kg/m2 43.9 kg/m2 43.75 kg/m2 42.56 kg/m2              Other   Bilateral hip pain   Chronic condition Not interested in getting an x-ray, declined othopedic refers  Encouraged to take OTC Tylenol  as needed Application of heat or ice stretching exercises encouraged      Morbid obesity (HCC)   Hard to do sternous excercses due to her chronic bilateral hip pain  Patient counseled on low-carb diet, encouraged to engage in regular moderate exercises as tolerated She is interested in starting a GLP-1 to assist with weight loss, fried phentermine in the past but it gave her insomnia Start Wegovy 0.25 mg once weekly injection,she plans on looking for patient assistance, I did discuss with the patient that I am not aware of any available patient assistance for wegovy. Patient denies personal or family history of MTC or MEN 2.  They denied personal history of pancreatitis.  Patient encouraged to avoid fatty fried foods eat smaller portions of meal to help decrease nausea.  Encouraged to report abdominal pain, nausea, vomiting.   Follow-up in 4 weeks if able to get the medication  Wt Readings from Last 3 Encounters:  05/18/24 (!) 319 lb (144.7 kg)  05/09/22 291 lb 9.6 oz (132.3 kg)  01/03/22 293 lb (132.9 kg)         Relevant Medications   Semaglutide-Weight Management (WEGOVY) 0.25 MG/0.5ML SOAJ   Vitamin D  deficiency   Last vitamin D  Lab Results  Component Value Date   VD25OH 14.9 (L) 08/09/2020  Rechecking labs Currently not on vitamin D   supplement      Other Visit Diagnoses       Screening for endocrine, nutritional, metabolic and immunity disorder    -  Primary   Relevant Orders   CBC   CMP14+EGFR   Hemoglobin A1c   TSH   Lipid panel   VITAMIN D  25 Hydroxy (Vit-D Deficiency, Fractures)     Insurance coverage problems       Relevant Orders   AMB Referral [facility] Care Management       Meds ordered this encounter  Medications   Semaglutide-Weight Management (WEGOVY) 0.25 MG/0.5ML SOAJ    Sig: Inject 0.25 mg into the skin once a week.    Dispense:  2 mL    Refill:  0    Follow-up: Return in about 6 months (around  11/17/2024) for HTN.    Van Seymore R Hansel Devan, FNP

## 2024-05-18 NOTE — Assessment & Plan Note (Addendum)
 Hard to do sternous excercses due to her chronic bilateral hip pain  Patient counseled on low-carb diet, encouraged to engage in regular moderate exercises as tolerated She is interested in starting a GLP-1 to assist with weight loss, fried phentermine in the past but it gave her insomnia Start Wegovy 0.25 mg once weekly injection,she plans on looking for patient assistance, I did discuss with the patient that I am not aware of any available patient assistance for wegovy. Patient denies personal or family history of MTC or MEN 2.  They denied personal history of pancreatitis.  Patient encouraged to avoid fatty fried foods eat smaller portions of meal to help decrease nausea.  Encouraged to report abdominal pain, nausea, vomiting.   Follow-up in 4 weeks if able to get the medication  Wt Readings from Last 3 Encounters:  05/18/24 (!) 319 lb (144.7 kg)  05/09/22 291 lb 9.6 oz (132.3 kg)  01/03/22 293 lb (132.9 kg)

## 2024-05-18 NOTE — Patient Instructions (Signed)
 1. Screening for endocrine, nutritional, metabolic and immunity disorder (Primary)  - CBC - CMP14+EGFR - Hemoglobin A1c - TSH - Lipid panel - VITAMIN D  25 Hydroxy (Vit-D Deficiency, Fractures)  2. Morbid obesity (HCC)  - Semaglutide-Weight Management (WEGOVY) 0.25 MG/0.5ML SOAJ; Inject 0.25 mg into the skin once a week.  Dispense: 2 mL; Refill: 0  It is important that you exercise regularly at least 30 minutes 5 times a week as tolerated  Think about what you will eat, plan ahead. Choose " clean, green, fresh or frozen" over canned, processed or packaged foods which are more sugary, salty and fatty. 70 to 75% of food eaten should be vegetables and fruit. Three meals at set times with snacks allowed between meals, but they must be fruit or vegetables. Aim to eat over a 12 hour period , example 7 am to 7 pm, and STOP after  your last meal of the day. Drink water,generally about 64 ounces per day, no other drink is as healthy. Fruit juice is best enjoyed in a healthy way, by EATING the fruit.  Thanks for choosing [facility] we consider it a privelige to serve you.

## 2024-05-18 NOTE — Assessment & Plan Note (Signed)
 Last vitamin D  Lab Results  Component Value Date   VD25OH 14.9 (L) 08/09/2020  Rechecking labs Currently not on vitamin D  supplement

## 2024-05-19 ENCOUNTER — Ambulatory Visit: Payer: Self-pay | Admitting: Nurse Practitioner

## 2024-05-19 DIAGNOSIS — E559 Vitamin D deficiency, unspecified: Secondary | ICD-10-CM

## 2024-05-19 DIAGNOSIS — E611 Iron deficiency: Secondary | ICD-10-CM

## 2024-05-19 LAB — CBC
Hematocrit: 38.8 % (ref 34.0–46.6)
Hemoglobin: 12 g/dL (ref 11.1–15.9)
MCH: 26.1 pg — ABNORMAL LOW (ref 26.6–33.0)
MCHC: 30.9 g/dL — ABNORMAL LOW (ref 31.5–35.7)
MCV: 84 fL (ref 79–97)
Platelets: 387 10*3/uL (ref 150–450)
RBC: 4.6 x10E6/uL (ref 3.77–5.28)
RDW: 14.4 % (ref 11.7–15.4)
WBC: 6.8 10*3/uL (ref 3.4–10.8)

## 2024-05-19 LAB — CMP14+EGFR
ALT: 10 IU/L (ref 0–32)
AST: 11 IU/L (ref 0–40)
Albumin: 4 g/dL (ref 3.9–4.9)
Alkaline Phosphatase: 91 IU/L (ref 44–121)
BUN/Creatinine Ratio: 11 (ref 9–23)
BUN: 7 mg/dL (ref 6–24)
Bilirubin Total: 0.2 mg/dL (ref 0.0–1.2)
CO2: 20 mmol/L (ref 20–29)
Calcium: 9.1 mg/dL (ref 8.7–10.2)
Chloride: 105 mmol/L (ref 96–106)
Creatinine, Ser: 0.66 mg/dL (ref 0.57–1.00)
Globulin, Total: 3 g/dL (ref 1.5–4.5)
Glucose: 113 mg/dL — ABNORMAL HIGH (ref 70–99)
Potassium: 4.6 mmol/L (ref 3.5–5.2)
Sodium: 142 mmol/L (ref 134–144)
Total Protein: 7 g/dL (ref 6.0–8.5)
eGFR: 111 mL/min/{1.73_m2} (ref >59)

## 2024-05-19 LAB — LIPID PANEL
Chol/HDL Ratio: 5.3 ratio — ABNORMAL HIGH (ref 0.0–4.4)
Cholesterol, Total: 207 mg/dL — ABNORMAL HIGH (ref 100–199)
HDL: 39 mg/dL — ABNORMAL LOW (ref >39)
LDL Chol Calc (NIH): 129 mg/dL — ABNORMAL HIGH (ref 0–99)
Triglycerides: 217 mg/dL — ABNORMAL HIGH (ref 0–149)
VLDL Cholesterol Cal: 39 mg/dL (ref 5–40)

## 2024-05-19 LAB — HEMOGLOBIN A1C
Est. average glucose Bld gHb Est-mCnc: 123 mg/dL
Hgb A1c MFr Bld: 5.9 % — ABNORMAL HIGH (ref 4.8–5.6)

## 2024-05-19 LAB — TSH: TSH: 3.51 u[IU]/mL (ref 0.450–4.500)

## 2024-05-19 LAB — VITAMIN D 25 HYDROXY (VIT D DEFICIENCY, FRACTURES): Vit D, 25-Hydroxy: 18 ng/mL — ABNORMAL LOW (ref 30.0–100.0)

## 2024-05-19 MED ORDER — VITAMIN D3 25 MCG (1000 UT) PO CAPS: 1000.0000 [IU] | ORAL_CAPSULE | Freq: Every day | ORAL | 1 refills | Status: AC

## 2024-05-20 ENCOUNTER — Other Ambulatory Visit: Payer: Self-pay | Admitting: Nurse Practitioner

## 2024-05-20 DIAGNOSIS — R718 Other abnormality of red blood cells: Secondary | ICD-10-CM

## 2024-05-21 LAB — IRON,TIBC AND FERRITIN PANEL
Ferritin: 17 ng/mL (ref 15–150)
Iron Saturation: 12 % — ABNORMAL LOW (ref 15–55)
Iron: 41 ug/dL (ref 27–159)
Total Iron Binding Capacity: 337 ug/dL (ref 250–450)
UIBC: 296 ug/dL (ref 131–425)

## 2024-05-21 LAB — SPECIMEN STATUS REPORT

## 2024-05-22 MED ORDER — FERROUS SULFATE 325 (65 FE) MG PO TBEC: 325.0000 mg | DELAYED_RELEASE_TABLET | Freq: Every day | ORAL | 3 refills | Status: AC

## 2024-05-26 ENCOUNTER — Telehealth: Payer: Self-pay | Admitting: *Deleted

## 2024-05-26 NOTE — Progress Notes (Unsigned)
 Complex Care Management Note Care Guide Note  05/26/2024 Name: Elaine [name] MRN: [id number] DOB: 18-Jun-1981   Complex Care Management Outreach Attempts: An unsuccessful telephone outreach was attempted today to offer the patient information about available complex care management services.  Follow Up Plan:  Additional outreach attempts will be made to offer the patient complex care management information and services.   Encounter Outcome:  No Answer  Barnie Bora  [facility]  [facility] Guide  Direct Dial: [phone]  Fax [phone]

## 2024-06-01 NOTE — Progress Notes (Unsigned)
 Complex Care Management Note Care Guide Note  06/01/2024 Name: Elaine [name] MRN: [id number] DOB: 1981-08-26   Complex Care Management Outreach Attempts: A second unsuccessful outreach was attempted today to offer the patient with information about available complex care management services.  Follow Up Plan:  Additional outreach attempts will be made to offer the patient complex care management information and services.   Encounter Outcome:  No Answer  Harlene Satterfield  [facility]  [facility] Guide  Direct Dial: [phone]  Fax [phone]

## 2024-06-02 NOTE — Progress Notes (Signed)
 Complex Care Management Note Care Guide Note  06/02/2024 Name: JUNETTE BERNAT MRN: [id number] DOB: 1981/01/03   Complex Care Management Outreach Attempts: A third unsuccessful outreach was attempted today to offer the patient with information about available complex care management services.  Follow Up Plan:  No further outreach attempts will be made at this time. We have been unable to contact the patient to offer or enroll patient in complex care management services.  Encounter Outcome:  No Answer  Harlene Satterfield  [facility]  [facility], [facility] Guide  Direct Dial: [phone]  Fax [phone]

## 2024-08-17 ENCOUNTER — Other Ambulatory Visit: Payer: Self-pay | Admitting: Nurse Practitioner

## 2024-08-17 DIAGNOSIS — I10 Essential (primary) hypertension: Secondary | ICD-10-CM

## 2024-09-30 ENCOUNTER — Other Ambulatory Visit: Payer: Self-pay | Admitting: Medical Genetics

## 2024-09-30 DIAGNOSIS — Z006 Encounter for examination for normal comparison and control in clinical research program: Secondary | ICD-10-CM

## 2024-11-02 LAB — GENECONNECT MOLECULAR SCREEN: Genetic Analysis Overall Interpretation: NEGATIVE

## 2024-11-16 ENCOUNTER — Other Ambulatory Visit: Payer: Self-pay | Admitting: Nurse Practitioner

## 2024-11-16 DIAGNOSIS — I10 Essential (primary) hypertension: Secondary | ICD-10-CM

## 2024-11-17 ENCOUNTER — Ambulatory Visit: Payer: Self-pay | Admitting: Nurse Practitioner
